# Patient Record
Sex: Male | Born: 1935 | Race: White | Hispanic: No | Marital: Married | Smoking: Former smoker
Health system: Southern US, Community
[De-identification: ages and names within clinical notes are randomized; demographics above are authoritative.]

## PROBLEM LIST (undated history)

## (undated) DIAGNOSIS — K219 Gastro-esophageal reflux disease without esophagitis: Secondary | ICD-10-CM

## (undated) DIAGNOSIS — C4490 Unspecified malignant neoplasm of skin, unspecified: Secondary | ICD-10-CM

## (undated) DIAGNOSIS — I2510 Atherosclerotic heart disease of native coronary artery without angina pectoris: Secondary | ICD-10-CM

## (undated) DIAGNOSIS — I739 Peripheral vascular disease, unspecified: Secondary | ICD-10-CM

## (undated) DIAGNOSIS — I519 Heart disease, unspecified: Secondary | ICD-10-CM

## (undated) DIAGNOSIS — F329 Major depressive disorder, single episode, unspecified: Secondary | ICD-10-CM

## (undated) DIAGNOSIS — E785 Hyperlipidemia, unspecified: Secondary | ICD-10-CM

## (undated) DIAGNOSIS — I10 Essential (primary) hypertension: Secondary | ICD-10-CM

## (undated) DIAGNOSIS — M1990 Unspecified osteoarthritis, unspecified site: Secondary | ICD-10-CM

## (undated) DIAGNOSIS — N159 Renal tubulo-interstitial disease, unspecified: Secondary | ICD-10-CM

## (undated) DIAGNOSIS — J449 Chronic obstructive pulmonary disease, unspecified: Secondary | ICD-10-CM

## (undated) DIAGNOSIS — R0600 Dyspnea, unspecified: Secondary | ICD-10-CM

## (undated) DIAGNOSIS — F32A Depression, unspecified: Secondary | ICD-10-CM

## (undated) DIAGNOSIS — I219 Acute myocardial infarction, unspecified: Secondary | ICD-10-CM

## (undated) HISTORY — DX: Peripheral vascular disease, unspecified: I73.9

## (undated) HISTORY — PX: COLONOSCOPY: SHX174

## (undated) HISTORY — PX: HERNIA REPAIR: SHX51

## (undated) HISTORY — DX: Hyperlipidemia, unspecified: E78.5

## (undated) HISTORY — DX: Heart disease, unspecified: I51.9

## (undated) HISTORY — PX: EYE SURGERY: SHX253

## (undated) HISTORY — PX: CARDIAC SURGERY: SHX584

## (undated) HISTORY — DX: Essential (primary) hypertension: I10

---

## 2011-08-27 DIAGNOSIS — I700 Atherosclerosis of aorta: Secondary | ICD-10-CM | POA: Diagnosis not present

## 2011-08-27 DIAGNOSIS — E782 Mixed hyperlipidemia: Secondary | ICD-10-CM | POA: Diagnosis not present

## 2011-08-27 DIAGNOSIS — I2510 Atherosclerotic heart disease of native coronary artery without angina pectoris: Secondary | ICD-10-CM | POA: Diagnosis not present

## 2012-03-30 DIAGNOSIS — I714 Abdominal aortic aneurysm, without rupture: Secondary | ICD-10-CM | POA: Diagnosis not present

## 2012-03-30 DIAGNOSIS — I4510 Unspecified right bundle-branch block: Secondary | ICD-10-CM | POA: Diagnosis not present

## 2012-03-30 DIAGNOSIS — I2510 Atherosclerotic heart disease of native coronary artery without angina pectoris: Secondary | ICD-10-CM | POA: Diagnosis not present

## 2012-03-30 DIAGNOSIS — I10 Essential (primary) hypertension: Secondary | ICD-10-CM | POA: Diagnosis not present

## 2012-04-08 DIAGNOSIS — E782 Mixed hyperlipidemia: Secondary | ICD-10-CM | POA: Diagnosis not present

## 2012-04-08 DIAGNOSIS — I2510 Atherosclerotic heart disease of native coronary artery without angina pectoris: Secondary | ICD-10-CM | POA: Diagnosis not present

## 2012-04-08 DIAGNOSIS — I10 Essential (primary) hypertension: Secondary | ICD-10-CM | POA: Diagnosis not present

## 2012-04-09 DIAGNOSIS — R5383 Other fatigue: Secondary | ICD-10-CM | POA: Diagnosis not present

## 2012-04-09 DIAGNOSIS — R5381 Other malaise: Secondary | ICD-10-CM | POA: Diagnosis not present

## 2012-04-09 DIAGNOSIS — I2510 Atherosclerotic heart disease of native coronary artery without angina pectoris: Secondary | ICD-10-CM | POA: Diagnosis not present

## 2012-04-09 DIAGNOSIS — E782 Mixed hyperlipidemia: Secondary | ICD-10-CM | POA: Diagnosis not present

## 2012-06-17 ENCOUNTER — Emergency Department (HOSPITAL_COMMUNITY): Payer: Medicare Other

## 2012-06-17 ENCOUNTER — Encounter (HOSPITAL_COMMUNITY)
Admission: EM | Disposition: A | Discharge status: Rehabilitation Facility | Payer: Self-pay | Source: Ambulatory Visit | Attending: Vascular Surgery

## 2012-06-17 ENCOUNTER — Emergency Department (HOSPITAL_COMMUNITY): Payer: Medicare Other | Admitting: Anesthesiology

## 2012-06-17 ENCOUNTER — Encounter (HOSPITAL_COMMUNITY): Payer: Self-pay | Admitting: Anesthesiology

## 2012-06-17 ENCOUNTER — Encounter (HOSPITAL_COMMUNITY): Payer: Self-pay | Admitting: *Deleted

## 2012-06-17 ENCOUNTER — Inpatient Hospital Stay (HOSPITAL_COMMUNITY)
Admission: EM | Admit: 2012-06-17 | Discharge: 2012-06-22 | DRG: 239 | Disposition: A | Discharge status: Rehabilitation Facility | Payer: Medicare Other | Source: Ambulatory Visit | Attending: Vascular Surgery | Admitting: Vascular Surgery

## 2012-06-17 DIAGNOSIS — I2510 Atherosclerotic heart disease of native coronary artery without angina pectoris: Secondary | ICD-10-CM | POA: Diagnosis present

## 2012-06-17 DIAGNOSIS — I70209 Unspecified atherosclerosis of native arteries of extremities, unspecified extremity: Secondary | ICD-10-CM | POA: Diagnosis not present

## 2012-06-17 DIAGNOSIS — Z79899 Other long term (current) drug therapy: Secondary | ICD-10-CM | POA: Diagnosis not present

## 2012-06-17 DIAGNOSIS — J984 Other disorders of lung: Secondary | ICD-10-CM | POA: Diagnosis not present

## 2012-06-17 DIAGNOSIS — I70229 Atherosclerosis of native arteries of extremities with rest pain, unspecified extremity: Secondary | ICD-10-CM | POA: Diagnosis present

## 2012-06-17 DIAGNOSIS — I724 Aneurysm of artery of lower extremity: Secondary | ICD-10-CM | POA: Diagnosis not present

## 2012-06-17 DIAGNOSIS — Z5189 Encounter for other specified aftercare: Secondary | ICD-10-CM | POA: Diagnosis not present

## 2012-06-17 DIAGNOSIS — F17200 Nicotine dependence, unspecified, uncomplicated: Secondary | ICD-10-CM | POA: Diagnosis present

## 2012-06-17 DIAGNOSIS — I70269 Atherosclerosis of native arteries of extremities with gangrene, unspecified extremity: Secondary | ICD-10-CM | POA: Diagnosis not present

## 2012-06-17 DIAGNOSIS — G547 Phantom limb syndrome without pain: Secondary | ICD-10-CM | POA: Diagnosis not present

## 2012-06-17 DIAGNOSIS — E785 Hyperlipidemia, unspecified: Secondary | ICD-10-CM | POA: Diagnosis present

## 2012-06-17 DIAGNOSIS — S78119A Complete traumatic amputation at level between unspecified hip and knee, initial encounter: Secondary | ICD-10-CM | POA: Diagnosis not present

## 2012-06-17 DIAGNOSIS — I743 Embolism and thrombosis of arteries of the lower extremities: Principal | ICD-10-CM | POA: Diagnosis present

## 2012-06-17 DIAGNOSIS — I999 Unspecified disorder of circulatory system: Secondary | ICD-10-CM | POA: Diagnosis not present

## 2012-06-17 DIAGNOSIS — E739 Lactose intolerance, unspecified: Secondary | ICD-10-CM | POA: Diagnosis present

## 2012-06-17 DIAGNOSIS — Z9861 Coronary angioplasty status: Secondary | ICD-10-CM

## 2012-06-17 DIAGNOSIS — I739 Peripheral vascular disease, unspecified: Secondary | ICD-10-CM | POA: Diagnosis not present

## 2012-06-17 DIAGNOSIS — R0602 Shortness of breath: Secondary | ICD-10-CM | POA: Diagnosis not present

## 2012-06-17 DIAGNOSIS — M79609 Pain in unspecified limb: Secondary | ICD-10-CM | POA: Diagnosis not present

## 2012-06-17 DIAGNOSIS — J189 Pneumonia, unspecified organism: Secondary | ICD-10-CM | POA: Diagnosis not present

## 2012-06-17 DIAGNOSIS — I998 Other disorder of circulatory system: Secondary | ICD-10-CM

## 2012-06-17 HISTORY — PX: FASCIOTOMY: SHX132

## 2012-06-17 HISTORY — PX: EMBOLECTOMY: SHX44

## 2012-06-17 HISTORY — PX: FEMORAL-POPLITEAL BYPASS GRAFT: SHX937

## 2012-06-17 HISTORY — PX: INTRAOPERATIVE ARTERIOGRAM: SHX5157

## 2012-06-17 LAB — BASIC METABOLIC PANEL
BUN: 13 mg/dL (ref 6–23)
BUN: 11 mg/dL (ref 6–23)
Calcium: 9.4 mg/dL (ref 8.4–10.5)
Calcium: 8.1 mg/dL — ABNORMAL LOW (ref 8.4–10.5)
Creatinine, Ser: 0.79 mg/dL (ref 0.50–1.35)
GFR calc Af Amer: >90 mL/min (ref >90)
GFR calc Af Amer: >90 mL/min (ref >90)
GFR calc non Af Amer: 83 mL/min — ABNORMAL LOW (ref >90)
Glucose, Bld: 111 mg/dL — ABNORMAL HIGH (ref 70–99)
Potassium: 4.1 mEq/L (ref 3.5–5.1)
Sodium: 135 mEq/L (ref 135–145)

## 2012-06-17 LAB — CBC
HCT: 37.7 % — ABNORMAL LOW (ref 39.0–52.0)
Hemoglobin: 13 g/dL (ref 13.0–17.0)
MCH: 32.6 pg (ref 26.0–34.0)
MCHC: 34.2 g/dL (ref 30.0–36.0)
MCHC: 34.5 g/dL (ref 30.0–36.0)
Platelets: 84 10*3/uL — ABNORMAL LOW (ref 150–400)
Platelets: 96 10*3/uL — ABNORMAL LOW (ref 150–400)
RBC: 3.99 MIL/uL — ABNORMAL LOW (ref 4.22–5.81)
RDW: 13.8 % (ref 11.5–15.5)
WBC: 7.8 10*3/uL (ref 4.0–10.5)

## 2012-06-17 LAB — TROPONIN I
Troponin I: <0.3 ng/mL (ref <0.30)
Troponin I: <0.3 ng/mL (ref <0.30)

## 2012-06-17 LAB — CK TOTAL AND CKMB (NOT AT ARMC)
CK, MB: 253.8 ng/mL (ref 0.3–4.0)
Relative Index: 1 (ref 0.0–2.5)
Total CK: 24777 U/L — ABNORMAL HIGH (ref 7–232)

## 2012-06-17 LAB — CBC WITH DIFFERENTIAL/PLATELET
Basophils Relative: 0 % (ref 0–1)
Eosinophils Absolute: 0 10*3/uL (ref 0.0–0.7)
Eosinophils Relative: 0 % (ref 0–5)
Lymphs Abs: 1.5 10*3/uL (ref 0.7–4.0)
MCH: 33.2 pg (ref 26.0–34.0)
MCHC: 36 g/dL (ref 30.0–36.0)
MCV: 92.2 fL (ref 78.0–100.0)
Monocytes Relative: 9 % (ref 3–12)
Neutrophils Relative %: 79 % — ABNORMAL HIGH (ref 43–77)
Platelets: 124 10*3/uL — ABNORMAL LOW (ref 150–400)
RBC: 4.88 MIL/uL (ref 4.22–5.81)

## 2012-06-17 SURGERY — EMBOLECTOMY
Anesthesia: General | Site: Leg Lower | Laterality: Right | Wound class: Clean

## 2012-06-17 MED ORDER — CEFAZOLIN SODIUM 1-5 GM-% IV SOLN
2.0000 g | Freq: Once | INTRAVENOUS | Status: AC
  Administered 2012-06-17: 2 g via INTRAVENOUS

## 2012-06-17 MED ORDER — DOPAMINE-DEXTROSE 3.2-5 MG/ML-% IV SOLN
3.0000 ug/kg/min | INTRAVENOUS | Status: DC | PRN
  Filled 2012-06-17: qty 250

## 2012-06-17 MED ORDER — ONDANSETRON HCL 4 MG/2ML IJ SOLN: 4.0000 mg | Freq: Four times a day (QID) | INTRAMUSCULAR | Status: DC | PRN

## 2012-06-17 MED ORDER — OXYCODONE HCL 5 MG/5ML PO SOLN: 5.0000 mg | Freq: Once | ORAL | Status: DC | PRN

## 2012-06-17 MED ORDER — CEFAZOLIN SODIUM 1-5 GM-% IV SOLN
1.0000 g | Freq: Three times a day (TID) | INTRAVENOUS | Status: DC
  Administered 2012-06-17 – 2012-06-18 (×2): 1 g via INTRAVENOUS
  Filled 2012-06-17 (×5): qty 50

## 2012-06-17 MED ORDER — POLYETHYLENE GLYCOL 3350 17 G PO PACK
17.0000 g | PACK | Freq: Every day | ORAL | Status: DC | PRN
  Filled 2012-06-17: qty 1

## 2012-06-17 MED ORDER — LACTATED RINGERS IV SOLN
INTRAVENOUS | Status: DC | PRN
  Administered 2012-06-17 (×4): via INTRAVENOUS

## 2012-06-17 MED ORDER — LISINOPRIL 10 MG PO TABS
10.0000 mg | ORAL_TABLET | Freq: Every day | ORAL | Status: DC
  Administered 2012-06-19 – 2012-06-21 (×3): 10 mg via ORAL
  Filled 2012-06-17 (×5): qty 1

## 2012-06-17 MED ORDER — SODIUM CHLORIDE 0.9 % IV SOLN
Freq: Once | INTRAVENOUS | Status: AC
  Administered 2012-06-17: 08:00:00 via INTRAVENOUS

## 2012-06-17 MED ORDER — SIMVASTATIN 20 MG PO TABS
20.0000 mg | ORAL_TABLET | Freq: Every day | ORAL | Status: DC
  Administered 2012-06-17 – 2012-06-20 (×3): 20 mg via ORAL
  Filled 2012-06-17 (×5): qty 1

## 2012-06-17 MED ORDER — IOHEXOL 300 MG/ML  SOLN
INTRAMUSCULAR | Status: DC | PRN
  Administered 2012-06-17: 50 mL via INTRA_ARTERIAL
  Administered 2012-06-17: 25 mL via INTRA_ARTERIAL

## 2012-06-17 MED ORDER — FENTANYL CITRATE 0.05 MG/ML IJ SOLN
INTRAMUSCULAR | Status: AC
  Filled 2012-06-17: qty 2

## 2012-06-17 MED ORDER — FENTANYL CITRATE 0.05 MG/ML IJ SOLN
INTRAMUSCULAR | Status: DC | PRN
  Administered 2012-06-17: 100 ug via INTRAVENOUS
  Administered 2012-06-17 (×3): 50 ug via INTRAVENOUS
  Administered 2012-06-17: 100 ug via INTRAVENOUS
  Administered 2012-06-17 (×3): 50 ug via INTRAVENOUS

## 2012-06-17 MED ORDER — 0.9 % SODIUM CHLORIDE (POUR BTL) OPTIME
TOPICAL | Status: DC | PRN
  Administered 2012-06-17: 3000 mL

## 2012-06-17 MED ORDER — IOHEXOL 300 MG/ML  SOLN
100.0000 mL | Freq: Once | INTRAMUSCULAR | Status: AC | PRN
  Administered 2012-06-17: 100 mL via INTRAVENOUS

## 2012-06-17 MED ORDER — PROPOFOL 10 MG/ML IV BOLUS
INTRAVENOUS | Status: DC | PRN
  Administered 2012-06-17: 150 mg via INTRAVENOUS

## 2012-06-17 MED ORDER — ACETAMINOPHEN 650 MG RE SUPP: 325.0000 mg | RECTAL | Status: DC | PRN

## 2012-06-17 MED ORDER — ROCURONIUM BROMIDE 100 MG/10ML IV SOLN
INTRAVENOUS | Status: DC | PRN
  Administered 2012-06-17: 50 mg via INTRAVENOUS

## 2012-06-17 MED ORDER — DIPHENHYDRAMINE HCL 12.5 MG/5ML PO ELIX
12.5000 mg | ORAL_SOLUTION | Freq: Four times a day (QID) | ORAL | Status: DC | PRN
  Filled 2012-06-17: qty 5

## 2012-06-17 MED ORDER — OXYCODONE HCL 5 MG PO TABS
5.0000 mg | ORAL_TABLET | Freq: Once | ORAL | Status: DC | PRN
  Administered 2012-06-17: 5 mg via ORAL

## 2012-06-17 MED ORDER — PROMETHAZINE HCL 25 MG/ML IJ SOLN: 6.2500 mg | INTRAMUSCULAR | Status: DC | PRN

## 2012-06-17 MED ORDER — PROTAMINE SULFATE 10 MG/ML IV SOLN
INTRAVENOUS | Status: DC | PRN
  Administered 2012-06-17: 30 mg via INTRAVENOUS

## 2012-06-17 MED ORDER — THROMBIN 20000 UNITS EX SOLR
CUTANEOUS | Status: DC | PRN
  Administered 2012-06-17: 13:00:00 via TOPICAL

## 2012-06-17 MED ORDER — HEPARIN SODIUM (PORCINE) 1000 UNIT/ML IJ SOLN
INTRAMUSCULAR | Status: DC | PRN
  Administered 2012-06-17: 8000 [IU] via INTRAVENOUS
  Administered 2012-06-17: 2000 [IU] via INTRAVENOUS

## 2012-06-17 MED ORDER — HEPARIN SODIUM (PORCINE) 5000 UNIT/ML IJ SOLN
INTRAMUSCULAR | Status: DC | PRN
  Administered 2012-06-17: 11:00:00

## 2012-06-17 MED ORDER — POTASSIUM CHLORIDE CRYS ER 20 MEQ PO TBCR
20.0000 meq | EXTENDED_RELEASE_TABLET | Freq: Every day | ORAL | Status: DC | PRN
  Filled 2012-06-17 (×2): qty 1

## 2012-06-17 MED ORDER — DIPHENHYDRAMINE HCL 50 MG/ML IJ SOLN: 12.5000 mg | Freq: Four times a day (QID) | INTRAMUSCULAR | Status: DC | PRN

## 2012-06-17 MED ORDER — ASPIRIN 81 MG PO CHEW
81.0000 mg | CHEWABLE_TABLET | Freq: Every day | ORAL | Status: DC
  Administered 2012-06-19 – 2012-06-21 (×3): 81 mg via ORAL
  Filled 2012-06-17 (×3): qty 1

## 2012-06-17 MED ORDER — LACTATED RINGERS IV SOLN
INTRAVENOUS | Status: DC | PRN
  Administered 2012-06-17: 10:00:00 via INTRAVENOUS

## 2012-06-17 MED ORDER — BISACODYL 10 MG RE SUPP: 10.0000 mg | Freq: Every day | RECTAL | Status: DC | PRN

## 2012-06-17 MED ORDER — ENOXAPARIN SODIUM 40 MG/0.4ML ~~LOC~~ SOLN
40.0000 mg | SUBCUTANEOUS | Status: DC
  Administered 2012-06-17: 40 mg via SUBCUTANEOUS
  Filled 2012-06-17 (×2): qty 0.4

## 2012-06-17 MED ORDER — SODIUM BICARBONATE 8.4 % IV SOLN
INTRAVENOUS | Status: DC | PRN
  Administered 2012-06-17: 50 meq via INTRAVENOUS

## 2012-06-17 MED ORDER — FENTANYL CITRATE 0.05 MG/ML IJ SOLN
100.0000 ug | Freq: Once | INTRAMUSCULAR | Status: AC
  Administered 2012-06-17: 100 ug via INTRAVENOUS
  Filled 2012-06-17 (×2): qty 2

## 2012-06-17 MED ORDER — PANTOPRAZOLE SODIUM 40 MG PO TBEC
40.0000 mg | DELAYED_RELEASE_TABLET | Freq: Every day | ORAL | Status: DC
  Administered 2012-06-17 – 2012-06-21 (×5): 40 mg via ORAL
  Filled 2012-06-17 (×5): qty 1

## 2012-06-17 MED ORDER — SODIUM CHLORIDE 0.9 % IJ SOLN: 9.0000 mL | INTRAMUSCULAR | Status: DC | PRN

## 2012-06-17 MED ORDER — MORPHINE SULFATE (PF) 1 MG/ML IV SOLN
INTRAVENOUS | Status: DC
  Administered 2012-06-17 – 2012-06-18 (×2): via INTRAVENOUS
  Administered 2012-06-18: 17 mg via INTRAVENOUS
  Administered 2012-06-18: 1 mg via INTRAVENOUS
  Administered 2012-06-18: 5 mg via INTRAVENOUS
  Administered 2012-06-18: 9 mg via INTRAVENOUS
  Administered 2012-06-18: 4 mg via INTRAVENOUS
  Administered 2012-06-18: 2 mg via INTRAVENOUS
  Administered 2012-06-19: 4 mg via INTRAVENOUS
  Administered 2012-06-19: 4.99 mg via INTRAVENOUS
  Administered 2012-06-19: 21:00:00 via INTRAVENOUS
  Filled 2012-06-17 (×2): qty 25

## 2012-06-17 MED ORDER — METOPROLOL TARTRATE 1 MG/ML IV SOLN: 2.0000 mg | INTRAVENOUS | Status: DC | PRN

## 2012-06-17 MED ORDER — ALBUMIN HUMAN 5 % IV SOLN
INTRAVENOUS | Status: DC | PRN
  Administered 2012-06-17: 14:00:00 via INTRAVENOUS

## 2012-06-17 MED ORDER — HYDROMORPHONE HCL PF 1 MG/ML IJ SOLN
0.2500 mg | INTRAMUSCULAR | Status: DC | PRN
  Administered 2012-06-17 (×2): 0.5 mg via INTRAVENOUS

## 2012-06-17 MED ORDER — ACETAMINOPHEN 325 MG PO TABS: 325.0000 mg | ORAL_TABLET | ORAL | Status: DC | PRN

## 2012-06-17 MED ORDER — GLYCOPYRROLATE 0.2 MG/ML IJ SOLN
INTRAMUSCULAR | Status: DC | PRN
  Administered 2012-06-17: .4 mg via INTRAVENOUS

## 2012-06-17 MED ORDER — DOCUSATE SODIUM 100 MG PO CAPS: 100.0000 mg | ORAL_CAPSULE | Freq: Every day | ORAL | Status: DC

## 2012-06-17 MED ORDER — SODIUM CHLORIDE 0.9 % IV SOLN: INTRAVENOUS | Status: DC

## 2012-06-17 MED ORDER — HYDRALAZINE HCL 20 MG/ML IJ SOLN
10.0000 mg | INTRAMUSCULAR | Status: DC | PRN
  Filled 2012-06-17: qty 0.5

## 2012-06-17 MED ORDER — PHENOL 1.4 % MT LIQD: 1.0000 | OROMUCOSAL | Status: DC | PRN

## 2012-06-17 MED ORDER — FENTANYL CITRATE 0.05 MG/ML IJ SOLN
100.0000 ug | Freq: Once | INTRAMUSCULAR | Status: AC
  Administered 2012-06-17: 100 ug via INTRAVENOUS

## 2012-06-17 MED ORDER — OMEGA-3-ACID ETHYL ESTERS 1 G PO CAPS
1.0000 g | ORAL_CAPSULE | Freq: Two times a day (BID) | ORAL | Status: DC
  Administered 2012-06-18 – 2012-06-21 (×7): 1 g via ORAL
  Filled 2012-06-17 (×9): qty 1

## 2012-06-17 MED ORDER — NEOSTIGMINE METHYLSULFATE 1 MG/ML IJ SOLN
INTRAMUSCULAR | Status: DC | PRN
  Administered 2012-06-17: 3 mg via INTRAVENOUS

## 2012-06-17 MED ORDER — ATENOLOL 50 MG PO TABS
50.0000 mg | ORAL_TABLET | Freq: Every day | ORAL | Status: DC
  Administered 2012-06-18 – 2012-06-21 (×4): 50 mg via ORAL
  Filled 2012-06-17 (×5): qty 1

## 2012-06-17 MED ORDER — PHENYLEPHRINE HCL 10 MG/ML IJ SOLN
10.0000 mg | INTRAVENOUS | Status: DC | PRN
  Administered 2012-06-17: 40 ug/min via INTRAVENOUS

## 2012-06-17 MED ORDER — NALOXONE HCL 0.4 MG/ML IJ SOLN: 0.4000 mg | INTRAMUSCULAR | Status: DC | PRN

## 2012-06-17 MED ORDER — DEXTROSE-NACL 5-0.45 % IV SOLN
Freq: Once | INTRAVENOUS | Status: AC
  Administered 2012-06-17: 18:00:00 via INTRAVENOUS

## 2012-06-17 MED ORDER — SODIUM CHLORIDE 0.9 % IV SOLN: 500.0000 mL | Freq: Once | INTRAVENOUS | Status: AC | PRN

## 2012-06-17 MED ORDER — LIDOCAINE HCL 4 % MT SOLN
OROMUCOSAL | Status: DC | PRN
  Administered 2012-06-17: 4 mL via TOPICAL

## 2012-06-17 MED ORDER — EPHEDRINE SULFATE 50 MG/ML IJ SOLN
INTRAMUSCULAR | Status: DC | PRN
  Administered 2012-06-17 (×2): 10 mg via INTRAVENOUS

## 2012-06-17 MED ORDER — FENTANYL CITRATE 0.05 MG/ML IJ SOLN
100.0000 ug | Freq: Once | INTRAMUSCULAR | Status: AC
  Administered 2012-06-17: 100 ug via INTRAVENOUS
  Filled 2012-06-17: qty 2

## 2012-06-17 MED ORDER — SODIUM CHLORIDE 0.9 % IV SOLN
INTRAVENOUS | Status: DC
  Administered 2012-06-17: 05:00:00 via INTRAVENOUS

## 2012-06-17 MED ORDER — SODIUM BICARBONATE 8.4 % IV SOLN
INTRAVENOUS | Status: DC
  Administered 2012-06-17: 17:00:00 via INTRAVENOUS
  Filled 2012-06-17 (×3): qty 150

## 2012-06-17 MED ORDER — LABETALOL HCL 5 MG/ML IV SOLN
10.0000 mg | INTRAVENOUS | Status: DC | PRN
  Filled 2012-06-17: qty 4

## 2012-06-17 MED ORDER — LACTATED RINGERS IV SOLN
INTRAVENOUS | Status: DC
  Administered 2012-06-17 (×2): via INTRAVENOUS

## 2012-06-17 MED ORDER — GUAIFENESIN-DM 100-10 MG/5ML PO SYRP: 15.0000 mL | ORAL_SOLUTION | ORAL | Status: DC | PRN

## 2012-06-17 SURGICAL SUPPLY — 79 items
BANDAGE ELASTIC 6 VELCRO ST LF (GAUZE/BANDAGES/DRESSINGS) ×2 IMPLANT
BANDAGE ESMARK 6X9 LF (GAUZE/BANDAGES/DRESSINGS) ×1 IMPLANT
BANDAGE GAUZE ELAST BULKY 4 IN (GAUZE/BANDAGES/DRESSINGS) ×2 IMPLANT
BNDG ESMARK 6X9 LF (GAUZE/BANDAGES/DRESSINGS) ×2
CANISTER SUCTION 2500CC (MISCELLANEOUS) ×2 IMPLANT
CATH EMB 3FR 40CM (CATHETERS) ×2 IMPLANT
CATH EMB 3FR 80CM (CATHETERS) IMPLANT
CATH EMB 4FR 40CM (CATHETERS) ×2 IMPLANT
CATH EMB 4FR 80CM (CATHETERS) IMPLANT
CATH EMB 5FR 80CM (CATHETERS) IMPLANT
CLIP TI MEDIUM 24 (CLIP) ×2 IMPLANT
CLIP TI WIDE RED SMALL 24 (CLIP) ×2 IMPLANT
CLOTH BEACON ORANGE TIMEOUT ST (SAFETY) ×2 IMPLANT
COVER SURGICAL LIGHT HANDLE (MISCELLANEOUS) ×2 IMPLANT
CUFF TOURNIQUET SINGLE 18IN (TOURNIQUET CUFF) IMPLANT
CUFF TOURNIQUET SINGLE 24IN (TOURNIQUET CUFF) IMPLANT
CUFF TOURNIQUET SINGLE 34IN LL (TOURNIQUET CUFF) ×2 IMPLANT
CUFF TOURNIQUET SINGLE 44IN (TOURNIQUET CUFF) IMPLANT
DERMABOND ADVANCED (GAUZE/BANDAGES/DRESSINGS) ×1
DERMABOND ADVANCED .7 DNX12 (GAUZE/BANDAGES/DRESSINGS) ×1 IMPLANT
DRAIN CHANNEL 15F RND FF W/TCR (WOUND CARE) IMPLANT
DRAPE C-ARM 42X72 X-RAY (DRAPES) ×2 IMPLANT
DRAPE WARM FLUID 44X44 (DRAPE) ×2 IMPLANT
DRAPE X-RAY CASS 24X20 (DRAPES) IMPLANT
DRSG COVADERM 4X8 (GAUZE/BANDAGES/DRESSINGS) IMPLANT
DRSG PAD ABDOMINAL 8X10 ST (GAUZE/BANDAGES/DRESSINGS) ×8 IMPLANT
ELECT REM PT RETURN 9FT ADLT (ELECTROSURGICAL) ×2
ELECTRODE REM PT RTRN 9FT ADLT (ELECTROSURGICAL) ×1 IMPLANT
EVACUATOR SILICONE 100CC (DRAIN) IMPLANT
GLOVE BIO SURGEON STRL SZ7 (GLOVE) ×6 IMPLANT
GLOVE BIOGEL PI IND STRL 6 (GLOVE) ×1 IMPLANT
GLOVE BIOGEL PI IND STRL 6.5 (GLOVE) ×6 IMPLANT
GLOVE BIOGEL PI IND STRL 7.0 (GLOVE) ×1 IMPLANT
GLOVE BIOGEL PI IND STRL 7.5 (GLOVE) ×2 IMPLANT
GLOVE BIOGEL PI INDICATOR 6 (GLOVE) ×1
GLOVE BIOGEL PI INDICATOR 6.5 (GLOVE) ×6
GLOVE BIOGEL PI INDICATOR 7.0 (GLOVE) ×1
GLOVE BIOGEL PI INDICATOR 7.5 (GLOVE) ×2
GLOVE ECLIPSE 6.5 STRL STRAW (GLOVE) ×2 IMPLANT
GLOVE ECLIPSE 7.0 STRL STRAW (GLOVE) ×2 IMPLANT
GLOVE SS BIOGEL STRL SZ 7 (GLOVE) ×1 IMPLANT
GLOVE SUPERSENSE BIOGEL SZ 7 (GLOVE) ×1
GOWN BRE IMP SLV AUR LG STRL (GOWN DISPOSABLE) ×2 IMPLANT
GOWN PREVENTION PLUS XLARGE (GOWN DISPOSABLE) ×4 IMPLANT
GOWN STRL NON-REIN LRG LVL3 (GOWN DISPOSABLE) ×12 IMPLANT
GRAFT PROPATEN W/RING 6X80X60 (Vascular Products) ×2 IMPLANT
KIT BASIN OR (CUSTOM PROCEDURE TRAY) ×2 IMPLANT
KIT ROOM TURNOVER OR (KITS) ×2 IMPLANT
NEEDLE PERC 18GX7CM (NEEDLE) ×2 IMPLANT
NS IRRIG 1000ML POUR BTL (IV SOLUTION) ×6 IMPLANT
PACK PERIPHERAL VASCULAR (CUSTOM PROCEDURE TRAY) ×2 IMPLANT
PAD ARMBOARD 7.5X6 YLW CONV (MISCELLANEOUS) ×4 IMPLANT
SET COLLECT BLD 21X3/4 12 (NEEDLE) ×2 IMPLANT
SHEATH AVANTI 11CM 5FR (MISCELLANEOUS) ×2 IMPLANT
SPONGE INTESTINAL PEANUT (DISPOSABLE) ×4 IMPLANT
SPONGE SURGIFOAM ABS GEL 100 (HEMOSTASIS) IMPLANT
STAPLER VISISTAT 35W (STAPLE) ×4 IMPLANT
STOPCOCK 4 WAY LG BORE MALE ST (IV SETS) ×2 IMPLANT
STRIP CLOSURE SKIN 1/2X4 (GAUZE/BANDAGES/DRESSINGS) IMPLANT
SUT GORETEX 5 0 TT13 24 (SUTURE) ×2 IMPLANT
SUT GORETEX 6.0 TT9 (SUTURE) ×4 IMPLANT
SUT MNCRL AB 4-0 PS2 18 (SUTURE) ×4 IMPLANT
SUT PROLENE 5 0 C 1 24 (SUTURE) ×2 IMPLANT
SUT PROLENE 6 0 BV (SUTURE) ×2 IMPLANT
SUT SILK 0 TIES 10X30 (SUTURE) ×2 IMPLANT
SUT SILK 2 0 FS (SUTURE) IMPLANT
SUT SILK 2 0 SH (SUTURE) ×2 IMPLANT
SUT VIC AB 2-0 CT1 27 (SUTURE) ×2
SUT VIC AB 2-0 CT1 TAPERPNT 27 (SUTURE) ×2 IMPLANT
SUT VIC AB 3-0 SH 27 (SUTURE) ×2
SUT VIC AB 3-0 SH 27X BRD (SUTURE) ×2 IMPLANT
SYR 3ML LL SCALE MARK (SYRINGE) ×2 IMPLANT
SYR TB 1ML LUER SLIP (SYRINGE) ×2 IMPLANT
TOWEL OR 17X24 6PK STRL BLUE (TOWEL DISPOSABLE) ×6 IMPLANT
TOWEL OR 17X26 10 PK STRL BLUE (TOWEL DISPOSABLE) ×4 IMPLANT
TRAY FOLEY CATH 14FRSI W/METER (CATHETERS) ×2 IMPLANT
TUBING EXTENTION W/L.L. (IV SETS) IMPLANT
UNDERPAD 30X30 INCONTINENT (UNDERPADS AND DIAPERS) ×2 IMPLANT
WATER STERILE IRR 1000ML POUR (IV SOLUTION) ×2 IMPLANT

## 2012-06-17 NOTE — Anesthesia Preprocedure Evaluation (Signed)
Anesthesia Evaluation    History of Anesthesia Complications Negative for: history of anesthetic complications  Airway       Dental   Pulmonary          Cardiovascular + CAD     Neuro/Psych    GI/Hepatic negative GI ROS, Neg liver ROS,   Endo/Other  negative endocrine ROS  Renal/GU negative Renal ROS     Musculoskeletal   Abdominal   Peds  Hematology   Anesthesia Other Findings   Reproductive/Obstetrics                           Anesthesia Physical Anesthesia Plan  ASA: III and Emergent  Anesthesia Plan: General   Post-op Pain Management:    Induction:   Airway Management Planned: Oral ETT  Additional Equipment:   Intra-op Plan:   Post-operative Plan: Post-operative intubation/ventilation  Informed Consent: I have reviewed the patients History and Physical, chart, labs and discussed the procedure including the risks, benefits and alternatives for the proposed anesthesia with the patient or authorized representative who has indicated his/her understanding and acceptance.   Dental advisory given  Plan Discussed with: CRNA, Anesthesiologist and Surgeon  Anesthesia Plan Comments:         Anesthesia Quick Evaluation

## 2012-06-17 NOTE — Transfer of Care (Signed)
Immediate Anesthesia Transfer of Care Note  Patient: Deontra Pereyra Srey  Procedure(s) Performed: Procedure(s) (LRB) with comments: EMBOLECTOMY (Right) - Thrombectomy of right tibial arteries,exclusion of right popliteal aneurysm, femoral -popliteal artery bypass graft,four compartment fasciotomies BYPASS GRAFT FEMORAL-POPLITEAL ARTERY (Right) - Using 6 mm x 80 cm propaten goretex graft INTRA OPERATIVE ARTERIOGRAM (Right) FASCIOTOMY (Right) - four compartment  fasciotomy  Patient Location: PACU  Anesthesia Type: General  Level of Consciousness: awake, sedated and patient cooperative  Airway & Oxygen Therapy: Patient Spontanous Breathing and Patient connected to nasal cannula oxygen  Post-op Assessment: Report given to PACU RN, Post -op Vital signs reviewed and stable, Patient moving all extremities and Patient able to stick tongue midline  Post vital signs: Reviewed and stable  Complications: No apparent anesthesia complications

## 2012-06-17 NOTE — Progress Notes (Signed)
CRITICAL VALUE ALERT  Critical value received:  CKMB 330   Date of notification:  06-17-12  Time of notification:  1700  Critical value read back:yes  Nurse who received alert:  Melanie,RN  MD notified (1st page):  Glennon Hamilton, [place]  Time of first page:  1705  MD notified (2nd page):  Time of second page:1737 to Dr.Chen, 1748 to Dr.Dickson  Responding MD:  Dr.Dickson  Time MD responded:  1754

## 2012-06-17 NOTE — H&P (Addendum)
[facility] [place]  Referred by:  [facility]  Reason for referral: right leg ischemia  History of Present Illness  Stephen Dean is a 78 y.o. (15-Jul-1929) male who presents with chief complaint: right leg pain.  Onset of symptom occurred 10 am yesterday.  Pain is described as aching, severity 10/10, and associated with movement.  Patient has attempted to treat this pain with rest and OTC medications.  The patient has rest pain symptoms and anesthesia and motor loss.  Atherosclerotic risk factors include: hyperlipidemia, smoking.  PMH Hyperlipidemia CAD Glucose intolerance  Past Surgical History  Procedure Date  . Hernia repair   . Cardiac surgery     Stent placement x 2    History   Social History  . Marital Status: Married    Spouse Name: N/A    Number of Children: N/A  . Years of Education: N/A   Occupational History  . Not on file.   Social History Main Topics  . Smoking status: Current Every Day Smoker -- 1.0 packs/day    Types: Cigarettes  . Smokeless tobacco: Not on file  . Alcohol Use: No  . Drug Use: No  . Sexually Active:    Other Topics Concern  . Not on file   Social History Narrative  . No narrative on file    No family history on file.  No current facility-administered medications on file prior to encounter.   Current Outpatient Prescriptions on File Prior to Encounter  Medication Sig Dispense Refill  . atenolol (TENORMIN) 50 MG tablet Take 50 mg by mouth daily.      . fosinopril (MONOPRIL) 10 MG tablet Take 10 mg by mouth daily.      Marland Kitchen omega-3 acid ethyl esters (LOVAZA) 1 G capsule Take 1 g by mouth 2 (two) times daily.      . pravastatin (PRAVACHOL) 40 MG tablet Take 80 mg by mouth daily.        No Known Allergies  REVIEW OF SYSTEMS:  (Positives checked otherwise negative)  CARDIOVASCULAR:  [ ]  chest pain, [ ]  chest pressure, [ ]  palpitations, [ ]  shortness of breath when laying flat, [ ]  shortness of  breath with exertion,   [x]  pain in feet when walking, [x]  pain in feet when laying flat, [ ]  history of blood clot in veins (DVT), [ ]  history of phlebitis, [ ]  swelling in legs, [ ]  varicose veins  PULMONARY:  [ ]  productive cough, [ ]  asthma, [ ]  wheezing  NEUROLOGIC:  [x]  weakness in arms or legs, [x]  numbness in arms or legs, [ ]  difficulty speaking or slurred speech, [ ]  temporary loss of vision in one eye, [ ]  dizziness  HEMATOLOGIC:  [ ]  bleeding problems, [ ]  problems with blood clotting too easily  MUSCULOSKEL:  [ ]  joint pain, [ ]  joint swelling  GASTROINTEST:  [ ]   Vomiting blood, [ ]   Blood in stool     GENITOURINARY:  [ ]   Burning with urination, [ ]   Blood in urine  PSYCHIATRIC:  [ ]  history of major depression  INTEGUMENTARY:  [ ]  rashes, [ ]  ulcers  CONSTITUTIONAL:  [ ]  fever, [ ]  chills   Physical Examination  Filed Vitals:   06/17/12 0732 06/17/12 0803 06/17/12 0815 06/17/12 0816  BP: 152/80 164/102  150/74  Pulse: 94 105 105   Temp:  98.6 F (37 C)    TempSrc:  Oral    Resp: 18  18    SpO2: 95% 97% 94%    There is no height or weight on file to calculate BMI.  General: A&O x 3, WDWN  Head: Adrian/AT  Ear/Nose/Throat: Hearing grossly intact, nares w/o erythema or drainage, oropharynx w/o Erythema/Exudate  Eyes: PERRLA, EOMI  Neck: Supple, no nuchal rigidity, no palpable LAD  Pulmonary: Sym exp, good air movt, CTAB, no rales, rhonchi, & wheezing  Cardiac: RRR, Nl S1, S2, no Murmurs, rubs or gallops  Vascular: Vessel Right Left  Radial Palpable Palpable  Ulnar Palpable Palpable  Brachial Palpable Palpable  Carotid Palpable, without bruit Palpable, without bruit  Aorta Not palpable N/A  Femoral Palpable Palpable  Popliteal Not palpable Not palpable  PT Not Palpable Not Palpable  DP Not Palpable Not Palpable   Gastrointestinal: soft, NTND, -G/R, - HSM, - masses, - CVAT B, no palpable midline pulse  Musculoskeletal: M/S 5/5 throughout except  RLE: 0/5 PF/DF, Extremities without ischemic changes except right foot: cold foot from knee down, cyanotic toes  Neurologic: CN 2-12 intact , Pain and light touch intact in extremities except R foot, Motor exam as listed above  Psychiatric: Judgment intact, Mood & affect appropriatefor pt's clinical situation  Dermatologic: See M/S exam for extremity exam, no rashes otherwise noted  Lymph : No Cervical, Axillary, or Inguinal lymphadenopathy   Laboratory: CBC:    Component Value Date/Time   WBC 12.6* 06/17/2012 0518   RBC 4.88 06/17/2012 0518   HGB 16.2 06/17/2012 0518   HCT 45.0 06/17/2012 0518   PLT 124* 06/17/2012 0518   MCV 92.2 06/17/2012 0518   MCH 33.2 06/17/2012 0518   MCHC 36.0 06/17/2012 0518   RDW 13.6 06/17/2012 0518   LYMPHSABS 1.5 06/17/2012 0518   MONOABS 1.1* 06/17/2012 0518   EOSABS 0.0 06/17/2012 0518   BASOSABS 0.0 06/17/2012 0518    BMP:    Component Value Date/Time   NA 135 06/17/2012 0518   K 4.1 06/17/2012 0518   CL 95* 06/17/2012 0518   CO2 27 06/17/2012 0518   GLUCOSE 111* 06/17/2012 0518   BUN 13 06/17/2012 0518   CREATININE 0.77 06/17/2012 0518   CALCIUM 9.4 06/17/2012 0518   GFRNONAA 83* 06/17/2012 0518   GFRAA >90 06/17/2012 0518    Coagulation: No results found for this basename: INR, PROTIME   No results found for this basename: PTT   CK 19009  CTA Rt leg runoff (06/17/12) 1. 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  2. Collateral reconstitution of the distal right popliteal artery and proximal anterior tibial runoff ; there is incomplete opacification distally to assess contiguity of the runoff across the ankle.  3. Patchy atheromatous plaque left femoral popliteal plaque without aneurysm. There is diseased anterior tibial and peroneal runoff.  Based on my review of this patient's RLE runoff, this patient has a thrombosed large popliteal artery with possibly occluded tibial runoff.  Medical Decision  Making  Stephen Dean is a 78 y.o. male who presents with: threatened right leg.   By history, this patient is well outside of the 6 hour window for limb salvage.  The elevated CK demonstrate some muscle death, but there is no frank rigor in the right lower leg muscles yet.  I don't think it is unreasonable to attempt an Emergency thrombectomy of the tibial arteries, exclusion of the femoropopliteal arterial segment, right femoropopliteal bypass, and right calf fasciotomies.  I have discussed with the patient that he is at very high risk for limb  loss, as the leg is partially dead already.  The patient has explicitly stated he does NOT want an amputation. The risk, benefits, and alternative for bypass operations were discussed with the patient.  The patient is aware the risks include but are not limited to: bleeding, infection, myocardial infarction, stroke, limb loss, nerve damage, need for additional procedures in the future, wound complications, and inability to complete the bypass.  The patient is aware of these risks and agreed to proceed.  I discussed in depth with the patient the nature of atherosclerosis, and emphasized the importance of maximal medical management including strict control of blood pressure, blood glucose, and lipid levels, antiplatelet agents, obtaining regular exercise, and cessation of smoking.  The patient is aware that without maximal medical management the underlying atherosclerotic disease process Stephen progress, limiting the benefit of any interventions.  Thank you for allowing [place] to participate in this patient's care.  Leonides Sake, MD [facility] [place] Office: [phone] Pager: [phone]  06/17/2012, 9:19 AM

## 2012-06-17 NOTE — ED Notes (Signed)
Pt transferred via carelink from [place].  Pt to see vascular surgery Dixon MD.  RN unable to find pedal pulses and Rt foot appears blue and cold.  Pt reports no sensation on bottom of foot but does have delayed cap refill of less than 2 sec.  RN received report that pt has aneurysm on rt leg but was vague as to where.  Pt alert oriented X4 and reports no pain at this time.  Pt has history of hypertension and abdominal aneurysm per carelink and family.

## 2012-06-17 NOTE — ED Provider Notes (Addendum)
History     CSN: [id number]  Arrival date & time 06/17/12  0430   First MD Initiated Contact with Patient 06/17/12 [phone]      Chief Complaint  Patient presents with  . Right leg injury     (Consider location/radiation/quality/duration/timing/severity/associated sxs/prior treatment) HPI This is an 78 year old male who was attempting to push up a window from a squatting position yesterday morning about 10 AM. He felt a pop but no pain immediately; within about half an hour he began to have pain in his right lower leg , worse with ambulation or movement. He has subsequently become unable to bear weight on his right lower leg. He is unable to dorsiflex his right foot or wiggle the toes of his right foot. His right foot is pale and cool. He states his pain is about a 9/10, located in the soft tissue of the right lower leg. There's no deformity or swelling.  History reviewed. No pertinent past medical history.  Past Surgical History  Procedure Date  . Hernia repair   . Cardiac surgery     Stent placement x 2    No family history on file.  History  Substance Use Topics  . Smoking status: Current Every Day Smoker -- 1.0 packs/day    Types: Cigarettes  . Smokeless tobacco: Not on file  . Alcohol Use: No      Review of Systems  All other systems reviewed and are negative.    Allergies  Review of patient's allergies indicates no known allergies.  Home Medications   Current Outpatient Rx  Name Route Sig Dispense Refill  . ACETAMINOPHEN 325 MG PO TABS Oral Take 650 mg by mouth every 6 (six) hours as needed.    . ASPIRIN 81 MG PO CHEW Oral Chew 81 mg by mouth daily.    . ATENOLOL 50 MG PO TABS Oral Take 50 mg by mouth daily.    . FOSINOPRIL SODIUM 10 MG PO TABS Oral Take 10 mg by mouth daily.    . CENTURY SENIOR PO Oral Take 1 tablet by mouth daily.    . OMEGA-3-ACID ETHYL ESTERS 1 G PO CAPS Oral Take 1 g by mouth 2 (two) times daily.    Marland Kitchen PRAVASTATIN SODIUM 40 MG PO TABS  Oral Take 80 mg by mouth daily.    Marland Kitchen VITAMIN C 500 MG PO TABS Oral Take 250 mg by mouth daily.    Marland Kitchen VITAMIN E 400 UNITS PO CAPS Oral Take 400 Units by mouth daily.      BP 150/74  Pulse 105  Temp 98.6 F (37 C) (Oral)  Resp 18  SpO2 94%  Physical Exam General: Well-developed, well-nourished male in no acute distress; appearance consistent with age of record HENT: normocephalic, atraumatic Eyes: pupils equal round and reactive to light; extraocular muscles intact; lens implants Neck: supple Heart: regular rate and rhythm Lungs: clear to auscultation bilaterally Abdomen: soft; nondistended; nontender; bowel sounds present Extremities: No deformity or edema; soft tissue tenderness of right lower leg; right foot pale and cool to touch with poor capillary refill, right posterior tibial and dorsalis pedis pulses not palpable or Dopplerable; flexion and extension of the right ankle and toes are virtually absent due to muscular weakness; flexion and extension of the right hip and right knee are intact; the left lower extremity is neurovascularly intact without pulse or motor deficit Neurologic: Awake, alert and oriented; motor function intact in all extremities except as noted above; no facial droop; decreased  sensation right foot Skin: Warm and dry Psychiatric: Normal mood and affect    ED Course  Procedures (including critical care time)  CRITICAL CARE Performed by: Eiliyah Reh L   Total critical care time: 40 minutes  Critical care time was exclusive of separately billable procedures and treating other patients.  Critical care was necessary to treat or prevent imminent or life-threatening deterioration.  Critical care was time spent personally by me on the following activities: development of treatment plan with patient and/or surrogate as well as nursing, discussions with consultants, evaluation of patient's response to treatment, examination of patient, obtaining history from  patient or surrogate, ordering and performing treatments and interventions, ordering and review of laboratory studies, ordering and review of radiographic studies, pulse oximetry and re-evaluation of patient's condition.    MDM   Nursing notes and vitals signs, including pulse oximetry, reviewed.  Summary of this visit's results, reviewed by myself:  Labs:  Results for orders placed during the hospital encounter of 06/17/12  CBC WITH DIFFERENTIAL      Component Value Range   WBC 12.6 (*) 4.0 - 10.5 K/uL   RBC 4.88  4.22 - 5.81 MIL/uL   Hemoglobin 16.2  13.0 - 17.0 g/dL   HCT 16.1  09.6 - 04.5 %   MCV 92.2  78.0 - 100.0 fL   MCH 33.2  26.0 - 34.0 pg   MCHC 36.0  30.0 - 36.0 g/dL   RDW 40.9  81.1 - 91.4 %   Platelets 124 (*) 150 - 400 K/uL   Neutrophils Relative 79 (*) 43 - 77 %   Neutro Abs 10.0 (*) 1.7 - 7.7 K/uL   Lymphocytes Relative 12  12 - 46 %   Lymphs Abs 1.5  0.7 - 4.0 K/uL   Monocytes Relative 9  3 - 12 %   Monocytes Absolute 1.1 (*) 0.1 - 1.0 K/uL   Eosinophils Relative 0  0 - 5 %   Eosinophils Absolute 0.0  0.0 - 0.7 K/uL   Basophils Relative 0  0 - 1 %   Basophils Absolute 0.0  0.0 - 0.1 K/uL  BASIC METABOLIC PANEL      Component Value Range   Sodium 135  135 - 145 mEq/L   Potassium 4.1  3.5 - 5.1 mEq/L   Chloride 95 (*) 96 - 112 mEq/L   CO2 27  19 - 32 mEq/L   Glucose, Bld 111 (*) 70 - 99 mg/dL   BUN 13  6 - 23 mg/dL   Creatinine, Ser 7.82  0.50 - 1.35 mg/dL   Calcium 9.4  8.4 - 95.6 mg/dL   GFR calc non Af Amer 83 (*) >90 mL/min   GFR calc Af Amer >90  >90 mL/min  CK      Component Value Range   Total CK 19009 (*) 7 - 232 U/L    Imaging Studies: Ct Angio Low Extrem Right W/cm &/or Wo/cm  06/17/2012  *RADIOLOGY REPORT*  Clinical data:  Right lower extremity swelling, loss of pulses.  CT ANGIOGRAM   BILATERAL LOWER EXTREMITIES WITH CONTRAST  Technique:  Axial helical CT of the  bilateral lower extremities after       Optiray 350 IV.  Coronal and  sagittal reconstructions were generated for vascular evaluation.  Comparison:  None  Findings: On the RIGHT, atherosclerotic plaque in the distal SFA.  There is a fusiform proximal popliteal aneurysm measuring up to 4.5 cm diameter, extending over a length of 11.2 cm,  from the adductor hiatus to just above the knee.  There is   a large amount of intraluminal thrombus. No evidence of rupture or extravasation. There is occlusion of the lumen in the distal aspect of the aneurysm which is above the knee joint.  The popliteal artery below the knee joint is atherosclerotic, of normal caliber, mostly thrombosed with a small reconstituted channel.  There is fairly heavy atheromatous plaque in its bifurcation.  The anterior tibial artery is patent proximally.  The tibioperoneal trunk appears occluded; there is reconstitution of the proximal peroneal and posterior tibial arteries. Poor opacification of distal tibial runoff vessels.  On the LEFT, patchy atheromatous calcification in the distal SFA resulting in tandem moderate stenoses.  There is mild eccentric calcified plaque scattered through the popliteal artery without aneurysm.  There is origin occlusion or stenosis of the anterior tibial artery, which is patent distally across the ankle as dorsalis pedis.  There is eccentric calcified plaque in the tibioperoneal trunk which remains patent. Mildly diseased but contiguous peroneal artery with a prominent posterior communicating branch supplying below the ankle.  Proximal posterior tibial artery is patent, but is mildly diseased and diminutive distally with probable tandem stenoses or short segment occlusions.  Venous phase imaging was not obtained.  IMPRESSION  1.  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 2. Collateral reconstitution of the distal right popliteal artery and proximal anterior tibial runoff ; there is incomplete opacification distally to assess contiguity of the runoff across the ankle. 3.   Patchy atheromatous plaque left femoral popliteal plaque without aneurysm. There is diseased anterior tibial and peroneal runoff.   Original Report Authenticated By: Osa Craver, M.D.    Dg Chest Port 1 View  06/17/2012  *RADIOLOGY REPORT*  Clinical Data: Short of breath  PORTABLE CHEST - 1 VIEW  Comparison: None.  Findings: Lungs are hyperaerated.  11 mm nodular density is present at the left base.  Normal heart size.  No pneumothorax.  No pleural effusion.  Normal vascularity.  IMPRESSION: Hyperaeration likely related to COPD.  11 mm nodular density at the left base.  Upright PA and lateral chest radiograph with nipple markers is recommended.   Original Report Authenticated By: Donavan Burnet, M.D.     7:25 AM IV fluids increased due to dark urine suggesting rhabdomyolysis of ischemic musculature of right lower leg. Dr. Edilia Bo (vascular surgery) accepts for transfer to [facility] ED.  9:05 AM Patient transferred to [facility] for surgical consultation and likely intervention. Elevated CK consistent with rhabdomyolysis.     Hanley Seamen, MD 06/17/12 0730  Hanley Seamen, MD 06/17/12 [phone]

## 2012-06-17 NOTE — Progress Notes (Signed)
[facility] [place]  I have discussed with the family the findings.  At this point, they agree that the patient should undergo an R AKA in order to avoid any SIRS or renal failure related to myoglobin released due to extensive dead gastrocnemius in the R leg.  The family and I agree to delay the AKA until tomorrow to give him a night to make a decision in regards to the L AKA.  He is tenatively scheduled for L AKA tomorrow to preserve the OR time.  Leonides Sake, MD [facility] [place] Office: [phone] Pager: [phone]  06/17/2012, 4:09 PM

## 2012-06-17 NOTE — ED Notes (Signed)
Per pt report: Yesterday at 10am pt was pushing the window up while stooped down.  When pt rose up, pt heard something popped.  Pt since then has had pain in his right leg.  Pt's right left is pale and cooler to touch compared to the left left.  DP pulses not palpated on the right foot but pt has a cap refill of less than 3 seconds.  Pt has limited movement in ankles.  Pt pain in right leg is on the calves and pain increases upon palpation.  Slight swelling noted on right ankles but no deformities noted.

## 2012-06-17 NOTE — Progress Notes (Signed)
MEDICATION RELATED CONSULT NOTE - INITIAL   Pharmacy Consult for Sodium Bicarbonate Drip Indication: Prevention of nephropathy in setting of dead limb  No Known Allergies  Patient Measurements: Weight: 180 lb 12.4 oz (82 kg)  Vital Signs: Temp: 98.2 F (36.8 C) (10/24 1645) Temp src: Oral (10/24 1645) BP: 124/58 mmHg (10/24 1700) Pulse Rate: 101  (10/24 1700) Intake/Output from previous day:   Intake/Output from this shift: Total I/O In: 3700 [I.V.:3200; IV Piggyback:500] Out: 1625 [Urine:1400; Blood:225]  Labs:  [facility] 06/17/12 1528 06/17/12 0518  WBC 7.8 12.6*  HGB 12.9* 16.2  HCT 37.7* 45.0  PLT 84* 124*  APTT -- --  CREATININE 0.79 0.77  LABCREA -- --  CREATININE 0.79 0.77  CREAT24HRUR -- --  MG -- --  PHOS -- --  ALBUMIN -- --  PROT -- --  ALBUMIN -- --  AST -- --  ALT -- --  ALKPHOS -- --  BILITOT -- --  BILIDIR -- --  IBILI -- --   CrCl is unknown because there is no height on file for the current visit.   Microbiology: No results found for this or any previous visit (from the past 720 hour(s)).  Medical History: History reviewed. No pertinent past medical history.  Assessment: 78 y.o. M who [facility] to the [facility] with R leg ischemia outside of window for leg salvage. The patient was noted to have a change in urine color consistent with myoglobin release from dead muscle. Pharmacy was consulted to start a bicarbonate drip to help prevent renal injury.   This patient was discussed with Dr. Imogene Burn -- he desires to only continue the sodium bicarbonate drip until tomorrow morning. The patient is noted to have already been exposed to IV contrast prior to pharmacy being consulted to dose sodium bicarb.   Goal of Therapy:  Prevention of further renal injury  Plan:  1. Initiate sodium bicarbonate 150 mEq in D5W at rate of 100 ml/hr for ~12 hours 2. Pharmacy will sign off as no further dose adjustments expected at this time.  Georgina Pillion, PharmD,  BCPS Clinical Pharmacist Pager: [phone] 06/17/2012 5:19 PM

## 2012-06-17 NOTE — ED Provider Notes (Signed)
Pt transferred from [facility] for evaluation of thrombosed R femoral artery aneurysm with cold pulseless foot, to see Vascular Surgery. Per nurse, Dr. Durwin Nora aware patient is here and will be down to see him when he finishes in the OR. Pt given pain medications. Pre-op CXR done as well.   Elina Streng B. Bernette Mayers, MD 06/17/12 0900

## 2012-06-17 NOTE — ED Notes (Signed)
Patient transported to CT 

## 2012-06-17 NOTE — ED Notes (Signed)
MD at bedside. 

## 2012-06-17 NOTE — Anesthesia Postprocedure Evaluation (Signed)
  Anesthesia Post-op Note  Patient: Stephen Dean  Procedure(s) Performed: Procedure(s) (LRB) with comments: EMBOLECTOMY (Right) - Thrombectomy of right tibial arteries,exclusion of right popliteal aneurysm, femoral -popliteal artery bypass graft,four compartment fasciotomies BYPASS GRAFT FEMORAL-POPLITEAL ARTERY (Right) - Using 6 mm x 80 cm propaten goretex graft INTRA OPERATIVE ARTERIOGRAM (Right) FASCIOTOMY (Right) - four compartment  fasciotomy  Patient Location: PACU  Anesthesia Type: General  Level of Consciousness: awake, alert , oriented and patient cooperative  Airway and Oxygen Therapy: Patient Spontanous Breathing and Patient connected to nasal cannula oxygen  Post-op Pain: mild  Post-op Assessment: Post-op Vital signs reviewed, Patient's Cardiovascular Status Stable, Respiratory Function Stable, Patent Airway, No signs of Nausea or vomiting and Pain level controlled  Post-op Vital Signs: stable  Complications: No apparent anesthesia complications

## 2012-06-17 NOTE — Op Note (Addendum)
OPERATIVE NOTE   PROCEDURE: 1.  Right anterior tibial artery thrombectomy 2.  Exclusion of popliteal artery 3.  Right common femoral artery to below-the-knee popliteal artery bypass with 6 mm Propaten 4.  Open cannulation of right common femoral artery  5.  Right leg runoff 6.  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  Dead gastrocnemius 2.  Marginal soleus and anterior compartment 3.  Viable lateral compartment 4.  Palpable pulse in anterior tibial artery at end of case 5.  Runoff via anterior tibial artery > posterior tibial artery   CONTRAST: 75 cc  SPECIMEN(S):  Thrombus in the popliteal artery  INDICATIONS:   Stephen Dean is a 78 y.o. male who presents with ischemic leg since 10 am yesterday.  There was already evidence of dead muscle with CPK >19K and foot drop on the right.  I discussed with the patient and family that his only chance to salvage the right leg was a tibial thrombectomy, exclusion of the popliteal aneurysm, femoropopliteal bypass to restore blood flow, and four compartment fasciotomies.  The risk, benefits, and alternative for bypass operations were discussed with the patient.  The patient is aware the risks include but are not limited to: bleeding, infection, myocardial infarction, stroke, limb loss, nerve damage, need for additional procedures in the future, wound complications, and inability to complete the bypass.  The patient is aware of these risks and agreed to proceed.   I emphasized to the family and patient the high likelihood of limb loss in his case.  DESCRIPTION: After full informed written consent was obtained, the patient was brought back to the operating room and placed supine upon the  operating table.  Prior to induction, the patient was given intravenous antibiotics.  After obtaining adequate anesthesia, the patient was prepped and draped in the standard fashion for a femoral to popliteal bypass operation.  The patient was given 8000 units of Heparin intravenously, which was a therapeutic bolus.  In total, 13086 units of Heparin was administrated to achieve and maintain a therapeutic level of anticoagulation.  A sterile tourniquet was placed on the right thigh as a precaution.  Attention was turned to the right groin.  A longitudinal incision was made over the right common femoral artery.  Using blunt dissection and electrocautery, the artery was dissected out from the inguinal ligament down to the femoral bifurcation.  The superficial femoral artery, profunda femoral artery, and external iliac artery were dissected out and vessel loops applied.  Circumflex branches were also dissected and controlled with silk ties.  This common femoral artery was found on exam to be somewhat calcified.  At this point, attention was turned to the calf.  An longitudinal incision was made one finger-width posterior to the tibia.  Using blunt dissection and electrocautery, a plane was developed through the subcutaneous tissue and fascia down to the popliteal space.  The superficial calf muscle was not particular active when stimulated with electrocautery.   The popliteal vein was dissected out and retracted medially and posteriorly.  The below-the-knee popliteal artery was dissected away from lateral popliteal vein.  This popliteal artery was found on exam to be somewhat calcified.  I dissected distally to find the tibioperoneal trunk and anterior tibial artery takeoffs.  These vessels were calcified.  In this dissection process, the soleus was partially taken down.  This muscle was found to be somewhat marginal, with some limited response to electrostimulation.  At this point, I made a transverse arteriotomy in  the below-the-knee popliteal artery with a 11-blade and extended it with a Potts scissor.  I noted some thrombus in the artery and extracted it in continuity and passed it off the field as a specimen.  I passed the 3 Fogarty distally extracting plaque without any thrombus.  I also passed the 4 Fogarty balloon distally, extracting no thrombus or plaque.  This balloon passed down to the dorsum of the foot without any difficulty.  I clamped the popliteal artery proximally and distally and then tunneled from between the femoral condyles to the right groin with the metal Gore tunneler.  I elected to used 6 mm externally ringed Propaten to avoid any further delay in revascularization given the extent of ischemia that had already occurred.  The graft was sewn to the inner cannula and passed through the tunnel, maintaining the orientation of the graft in the tunnel.  The graft was sharply released and the tunnel was removed.  I then tied off the superficial femoral artery with 3 2-0 ligatures and transected the proximal superficial femoral artery to exclude this end of the femoropopiteal aneurysm.  I then clamped the proximal common femoral artery and profunda femoral artery.  The common femoral artery was also somewhat ectatic (1.5-2.0 cm).  I spatulated the proximal end of the graft and then made an arteriotomy and extended it with a Pott's scissor to match the graft.  The graft was sewn to the common femoral artery with a running stitch of CV-5 in a end to side configuration.  I clamped the graft distal to the anastomosis.  I then pulled the graft taunt in the subsatorial tunnel from the popliteal exposure.  I finished transecting the below-the-knee popliteal artery after clamping the proximal end.  I tied this end with two 2-0 Silk ties, to finish excluding the femoropopliteal aneurysm.  I then spatulated the remaining end of the popliteal artery.  I spatulated the distal end of the graft, adjusting the length in the  process.  Extra external rings were removed.  I sewed the graft to the popliteal artery in an end to end fashion with a CV-6.  Prior to completing this anastomosis, I repeated the thrombectomy with the 4 Fogarty, obtaining no clot.  I also allowed the graft to bleeding antegrade, obtaining good pulsatile bleeding.  I completed the anastomosis in the usual fashion.  I released all clamps.  Immediately there was a popliteal pulse present and palpable anterior tibial artery.  I gave the patient 30 mg of Protamine to reverse anticoagulation and placed thrombin and gelfoam in all wounds.    I turned my attention back to the groin.  I cannulated the common femoral artery with a 18 gauge needle and placed a short J-wire into the external iliac artery.  A 5-Fr sheath was placed over the wire into the common femoral artery.  The sheath was connected to IV extension tubing.  I did hand injections to complete a Right leg runoff in stations.  There was no technical issues with bypass.  Runoff is via an anterior tibial artery and a disease tibioperoneal trunk to a posterior tibial artery.  There was limited flow into the feet noted on the films, consistent with microembolization from a popliteal aneurysm.  I removed the sheath and then clamped  the common femoral artery.  I repaired the needle hole with a 6-0 Prolene.  I then turned my attention back to the right calf.  I extended the incision medial down to the level of the ankle, tying off varicose veins in the process.  I opened up the deep and superficial posterior compartment fascia.  At this point, the entire calf muscle appeared dead, and not responsive to electrostimulation.  Normally, I would proceed with amputation, but this patient had explicitly stated he did not want amputation.  I then turned my attention to the lateral calf.  I made an longitudinal incision between the fibula and tibia, mirroring the medial incision.  Using electrocautery, I opened the fascia  of the anterior and lateral compartments.  The muscle in the anterior compartment appears somewhat marginal but responsive to electrostimulation.  The lateral compartment muscle was viable.  At this point, I turned my attention to hemostasis in all surgical sites.  Bleeding points were controlled with electrocautery, ties, and clips.  By the end of this case, there was no further active bleeding.  The right groin was repaired with a double layer of 2-0 Vicryl in the deep tissue, a double layer of 3-0 Vicryl in the subcutaneous tissue, and the skin was reapproximated with a running subcuticular of 4-0 Monocryl.  The skin was cleaned, dried, and reinforced with Dermabond.  I then packed sterile wet-to-dry Kerlix into the calf fasciotomy incisions.  ABD pads were placed around the Kerlix and then the dressing were wrapped with Kerlix and ACE bandages.  A palpable AT pulse was present at this point.  At this point, the anesthetist noted change in the color of the patient's urine.  This is consistent with likely myoglobin release from the dead muscle.  I had discussed these concerns in regards to renal failure with the patient preoperatively, but he insisted that he did not want an amputation.  With the extensive dead muscle in the right leg, I doubt this leg is salvage even with restoration of perfusion as evident by the palpable anterior tibial artery.  COMPLICATIONS: none  CONDITION: guard   Nilda Simmer, MD 06/17/2012 3:32 PM

## 2012-06-17 NOTE — Preoperative (Signed)
Beta Blockers   Reason not to administer Beta Blockers:Concurrent use of intravenous inotropic medications during the perioperative 

## 2012-06-18 ENCOUNTER — Inpatient Hospital Stay (HOSPITAL_COMMUNITY): Payer: Medicare Other | Admitting: Certified Registered Nurse Anesthetist

## 2012-06-18 ENCOUNTER — Encounter (HOSPITAL_COMMUNITY)
Admission: EM | Disposition: A | Discharge status: Rehabilitation Facility | Payer: Self-pay | Source: Ambulatory Visit | Attending: Vascular Surgery

## 2012-06-18 ENCOUNTER — Encounter (HOSPITAL_COMMUNITY): Payer: Self-pay | Admitting: Certified Registered Nurse Anesthetist

## 2012-06-18 DIAGNOSIS — I70269 Atherosclerosis of native arteries of extremities with gangrene, unspecified extremity: Secondary | ICD-10-CM

## 2012-06-18 DIAGNOSIS — I739 Peripheral vascular disease, unspecified: Secondary | ICD-10-CM | POA: Diagnosis not present

## 2012-06-18 DIAGNOSIS — I999 Unspecified disorder of circulatory system: Secondary | ICD-10-CM | POA: Diagnosis not present

## 2012-06-18 HISTORY — PX: AMPUTATION: SHX166

## 2012-06-18 LAB — COMPREHENSIVE METABOLIC PANEL
ALT: 111 U/L — ABNORMAL HIGH (ref 0–53)
AST: 547 U/L — ABNORMAL HIGH (ref 0–37)
Alkaline Phosphatase: 44 U/L (ref 39–117)
Calcium: 8.2 mg/dL — ABNORMAL LOW (ref 8.4–10.5)
Glucose, Bld: 112 mg/dL — ABNORMAL HIGH (ref 70–99)
Potassium: 3.6 mEq/L (ref 3.5–5.1)
Sodium: 138 mEq/L (ref 135–145)
Total Protein: 5.3 g/dL — ABNORMAL LOW (ref 6.0–8.3)

## 2012-06-18 LAB — CK TOTAL AND CKMB (NOT AT ARMC)
CK, MB: 165.8 ng/mL (ref 0.3–4.0)
Relative Index: 0.6 (ref 0.0–2.5)
Total CK: 25758 U/L — ABNORMAL HIGH (ref 7–232)

## 2012-06-18 LAB — TROPONIN I: Troponin I: <0.3 ng/mL (ref <0.30)

## 2012-06-18 SURGERY — AMPUTATION, ABOVE KNEE
Anesthesia: General | Site: Leg Upper | Laterality: Right | Wound class: Clean

## 2012-06-18 MED ORDER — GUAIFENESIN-DM 100-10 MG/5ML PO SYRP: 15.0000 mL | ORAL_SOLUTION | ORAL | Status: DC | PRN

## 2012-06-18 MED ORDER — DEXTROSE 5 % IV SOLN
1.5000 g | Freq: Two times a day (BID) | INTRAVENOUS | Status: AC
  Administered 2012-06-18 – 2012-06-19 (×2): 1.5 g via INTRAVENOUS
  Filled 2012-06-18 (×2): qty 1.5

## 2012-06-18 MED ORDER — LABETALOL HCL 5 MG/ML IV SOLN: 10.0000 mg | INTRAVENOUS | Status: DC | PRN

## 2012-06-18 MED ORDER — GLYCOPYRROLATE 0.2 MG/ML IJ SOLN
INTRAMUSCULAR | Status: DC | PRN
  Administered 2012-06-18: .6 mg via INTRAVENOUS

## 2012-06-18 MED ORDER — LACTATED RINGERS IV SOLN
INTRAVENOUS | Status: DC | PRN
  Administered 2012-06-18 (×3): via INTRAVENOUS

## 2012-06-18 MED ORDER — ALBUMIN HUMAN 5 % IV SOLN
INTRAVENOUS | Status: DC | PRN
  Administered 2012-06-18 (×2): via INTRAVENOUS

## 2012-06-18 MED ORDER — PANTOPRAZOLE SODIUM 40 MG PO TBEC: 40.0000 mg | DELAYED_RELEASE_TABLET | Freq: Every day | ORAL | Status: DC

## 2012-06-18 MED ORDER — CEFAZOLIN SODIUM 1-5 GM-% IV SOLN
INTRAVENOUS | Status: AC
  Filled 2012-06-18: qty 50

## 2012-06-18 MED ORDER — LIDOCAINE HCL (CARDIAC) 20 MG/ML IV SOLN
INTRAVENOUS | Status: DC | PRN
  Administered 2012-06-18: 30 mg via INTRAVENOUS

## 2012-06-18 MED ORDER — ACETAMINOPHEN 650 MG RE SUPP: 325.0000 mg | RECTAL | Status: DC | PRN

## 2012-06-18 MED ORDER — CEFAZOLIN SODIUM 1-5 GM-% IV SOLN
INTRAVENOUS | Status: DC | PRN
  Administered 2012-06-18: 1 g via INTRAVENOUS

## 2012-06-18 MED ORDER — MIDAZOLAM HCL 5 MG/5ML IJ SOLN
INTRAMUSCULAR | Status: DC | PRN
  Administered 2012-06-18: 2 mg via INTRAVENOUS

## 2012-06-18 MED ORDER — FENTANYL CITRATE 0.05 MG/ML IJ SOLN
INTRAMUSCULAR | Status: DC | PRN
  Administered 2012-06-18: 100 ug via INTRAVENOUS
  Administered 2012-06-18: 50 ug via INTRAVENOUS

## 2012-06-18 MED ORDER — METOPROLOL TARTRATE 1 MG/ML IV SOLN: 2.0000 mg | INTRAVENOUS | Status: DC | PRN

## 2012-06-18 MED ORDER — SODIUM CHLORIDE 0.9 % IV SOLN
10.0000 mg | INTRAVENOUS | Status: DC | PRN
  Administered 2012-06-18: 40 ug/min via INTRAVENOUS

## 2012-06-18 MED ORDER — PHENYLEPHRINE HCL 10 MG/ML IJ SOLN
INTRAMUSCULAR | Status: DC | PRN
  Administered 2012-06-18 (×3): 160 ug via INTRAVENOUS

## 2012-06-18 MED ORDER — PROPOFOL 10 MG/ML IV BOLUS
INTRAVENOUS | Status: DC | PRN
  Administered 2012-06-18: 100 mg via INTRAVENOUS

## 2012-06-18 MED ORDER — OXYCODONE-ACETAMINOPHEN 5-325 MG PO TABS
1.0000 | ORAL_TABLET | ORAL | Status: DC | PRN
  Administered 2012-06-20: 2 via ORAL
  Filled 2012-06-18: qty 2

## 2012-06-18 MED ORDER — POTASSIUM CHLORIDE CRYS ER 20 MEQ PO TBCR: 20.0000 meq | EXTENDED_RELEASE_TABLET | Freq: Once | ORAL | Status: AC | PRN

## 2012-06-18 MED ORDER — ONDANSETRON HCL 4 MG/2ML IJ SOLN: 4.0000 mg | Freq: Four times a day (QID) | INTRAMUSCULAR | Status: DC | PRN

## 2012-06-18 MED ORDER — HYDROMORPHONE HCL PF 1 MG/ML IJ SOLN
INTRAMUSCULAR | Status: AC
  Filled 2012-06-18: qty 1

## 2012-06-18 MED ORDER — ACETAMINOPHEN 10 MG/ML IV SOLN: 1000.0000 mg | Freq: Once | INTRAVENOUS | Status: DC | PRN

## 2012-06-18 MED ORDER — MAGNESIUM SULFATE 40 MG/ML IJ SOLN
2.0000 g | Freq: Once | INTRAMUSCULAR | Status: AC | PRN
  Filled 2012-06-18: qty 50

## 2012-06-18 MED ORDER — ONDANSETRON HCL 4 MG/2ML IJ SOLN: 4.0000 mg | Freq: Once | INTRAMUSCULAR | Status: DC | PRN

## 2012-06-18 MED ORDER — 0.9 % SODIUM CHLORIDE (POUR BTL) OPTIME
TOPICAL | Status: DC | PRN
  Administered 2012-06-18: 1000 mL

## 2012-06-18 MED ORDER — SENNOSIDES-DOCUSATE SODIUM 8.6-50 MG PO TABS
1.0000 | ORAL_TABLET | Freq: Every evening | ORAL | Status: DC | PRN
  Filled 2012-06-18: qty 1

## 2012-06-18 MED ORDER — DOCUSATE SODIUM 100 MG PO CAPS
100.0000 mg | ORAL_CAPSULE | Freq: Every day | ORAL | Status: DC
  Administered 2012-06-20 – 2012-06-21 (×2): 100 mg via ORAL
  Filled 2012-06-18 (×2): qty 1

## 2012-06-18 MED ORDER — MORPHINE SULFATE (PF) 1 MG/ML IV SOLN
INTRAVENOUS | Status: AC
  Filled 2012-06-18: qty 25

## 2012-06-18 MED ORDER — SODIUM CHLORIDE 0.9 % IV SOLN
INTRAVENOUS | Status: DC
  Administered 2012-06-18 – 2012-06-19 (×2): via INTRAVENOUS

## 2012-06-18 MED ORDER — PHENOL 1.4 % MT LIQD: 1.0000 | OROMUCOSAL | Status: DC | PRN

## 2012-06-18 MED ORDER — ROCURONIUM BROMIDE 100 MG/10ML IV SOLN
INTRAVENOUS | Status: DC | PRN
  Administered 2012-06-18: 35 mg via INTRAVENOUS
  Administered 2012-06-18: 5 mg via INTRAVENOUS

## 2012-06-18 MED ORDER — HYDROMORPHONE HCL PF 1 MG/ML IJ SOLN
0.2500 mg | INTRAMUSCULAR | Status: DC | PRN
  Administered 2012-06-18 (×2): 0.5 mg via INTRAVENOUS

## 2012-06-18 MED ORDER — ONDANSETRON HCL 4 MG/2ML IJ SOLN
INTRAMUSCULAR | Status: DC | PRN
  Administered 2012-06-18: 4 mg via INTRAVENOUS

## 2012-06-18 MED ORDER — ACETAMINOPHEN 325 MG PO TABS: 325.0000 mg | ORAL_TABLET | ORAL | Status: DC | PRN

## 2012-06-18 MED ORDER — HYDRALAZINE HCL 20 MG/ML IJ SOLN: 10.0000 mg | INTRAMUSCULAR | Status: DC | PRN

## 2012-06-18 MED ORDER — NEOSTIGMINE METHYLSULFATE 1 MG/ML IJ SOLN
INTRAMUSCULAR | Status: DC | PRN
  Administered 2012-06-18: 3 mg via INTRAVENOUS

## 2012-06-18 MED ORDER — ALUM & MAG HYDROXIDE-SIMETH 200-200-20 MG/5ML PO SUSP: 15.0000 mL | ORAL | Status: DC | PRN

## 2012-06-18 SURGICAL SUPPLY — 54 items
BANDAGE ELASTIC 4 VELCRO ST LF (GAUZE/BANDAGES/DRESSINGS) ×2 IMPLANT
BANDAGE ELASTIC 6 VELCRO ST LF (GAUZE/BANDAGES/DRESSINGS) ×2 IMPLANT
BANDAGE ESMARK 6X9 LF (GAUZE/BANDAGES/DRESSINGS) IMPLANT
BANDAGE GAUZE ELAST BULKY 4 IN (GAUZE/BANDAGES/DRESSINGS) ×4 IMPLANT
BLADE SAW RECIP 87.9 MT (BLADE) ×2 IMPLANT
BNDG COHESIVE 6X5 TAN STRL LF (GAUZE/BANDAGES/DRESSINGS) ×2 IMPLANT
BNDG ESMARK 6X9 LF (GAUZE/BANDAGES/DRESSINGS)
CANISTER SUCTION 2500CC (MISCELLANEOUS) ×2 IMPLANT
CLIP TI MEDIUM 6 (CLIP) ×2 IMPLANT
CLOTH BEACON ORANGE TIMEOUT ST (SAFETY) ×2 IMPLANT
COVER SURGICAL LIGHT HANDLE (MISCELLANEOUS) ×2 IMPLANT
CUFF TOURNIQUET SINGLE 24IN (TOURNIQUET CUFF) IMPLANT
CUFF TOURNIQUET SINGLE 34IN LL (TOURNIQUET CUFF) IMPLANT
CUFF TOURNIQUET SINGLE 44IN (TOURNIQUET CUFF) IMPLANT
DRAPE ORTHO SPLIT 77X108 STRL (DRAPES) ×2
DRAPE PROXIMA HALF (DRAPES) ×2 IMPLANT
DRAPE SURG ORHT 6 SPLT 77X108 (DRAPES) ×2 IMPLANT
DRSG ADAPTIC 3X8 NADH LF (GAUZE/BANDAGES/DRESSINGS) ×2 IMPLANT
DRSG PAD ABDOMINAL 8X10 ST (GAUZE/BANDAGES/DRESSINGS) ×2 IMPLANT
ELECT REM PT RETURN 9FT ADLT (ELECTROSURGICAL) ×2
ELECTRODE REM PT RTRN 9FT ADLT (ELECTROSURGICAL) ×1 IMPLANT
EVACUATOR 1/8 PVC DRAIN (DRAIN) ×2 IMPLANT
GLOVE BIOGEL PI IND STRL 6.5 (GLOVE) ×2 IMPLANT
GLOVE BIOGEL PI IND STRL 7.0 (GLOVE) ×1 IMPLANT
GLOVE BIOGEL PI IND STRL 7.5 (GLOVE) ×1 IMPLANT
GLOVE BIOGEL PI INDICATOR 6.5 (GLOVE) ×2
GLOVE BIOGEL PI INDICATOR 7.0 (GLOVE) ×1
GLOVE BIOGEL PI INDICATOR 7.5 (GLOVE) ×1
GLOVE ECLIPSE 6.5 STRL STRAW (GLOVE) ×2 IMPLANT
GLOVE SS BIOGEL STRL SZ 7 (GLOVE) ×2 IMPLANT
GLOVE SUPERSENSE BIOGEL SZ 7 (GLOVE) ×2
GOWN STRL NON-REIN LRG LVL3 (GOWN DISPOSABLE) ×4 IMPLANT
GOWN STRL REIN 2XL LVL4 (GOWN DISPOSABLE) ×2 IMPLANT
KIT BASIN OR (CUSTOM PROCEDURE TRAY) ×2 IMPLANT
KIT ROOM TURNOVER OR (KITS) ×2 IMPLANT
NS IRRIG 1000ML POUR BTL (IV SOLUTION) ×2 IMPLANT
PACK GENERAL/GYN (CUSTOM PROCEDURE TRAY) ×2 IMPLANT
PAD ARMBOARD 7.5X6 YLW CONV (MISCELLANEOUS) ×4 IMPLANT
PADDING CAST COTTON 6X4 STRL (CAST SUPPLIES) IMPLANT
SPONGE GAUZE 4X4 12PLY (GAUZE/BANDAGES/DRESSINGS) ×2 IMPLANT
STAPLER VISISTAT 35W (STAPLE) ×2 IMPLANT
STOCKINETTE IMPERVIOUS LG (DRAPES) ×2 IMPLANT
SUT SILK 2 0 (SUTURE) ×1
SUT SILK 2 0 SH CR/8 (SUTURE) ×4 IMPLANT
SUT SILK 2-0 18XBRD TIE 12 (SUTURE) ×1 IMPLANT
SUT SILK 3 0 (SUTURE) ×1
SUT SILK 3-0 18XBRD TIE 12 (SUTURE) ×1 IMPLANT
SUT VIC AB 0 CTX 18 (SUTURE) ×2 IMPLANT
SUT VIC AB 2-0 CT1 18 (SUTURE) ×2 IMPLANT
SUT VIC AB 2-0 CTX 36 (SUTURE) ×4 IMPLANT
TOWEL OR 17X24 6PK STRL BLUE (TOWEL DISPOSABLE) ×4 IMPLANT
TOWEL OR 17X26 10 PK STRL BLUE (TOWEL DISPOSABLE) ×2 IMPLANT
UNDERPAD 30X30 INCONTINENT (UNDERPADS AND DIAPERS) ×2 IMPLANT
WATER STERILE IRR 1000ML POUR (IV SOLUTION) ×2 IMPLANT

## 2012-06-18 NOTE — Care Management Note (Signed)
    Page 1 of 1   06/22/2012     3:17:54 PM   CARE MANAGEMENT NOTE 06/22/2012  Patient:  DAMARCO, KEYSOR   Account Number:  [id number]  Date Initiated:  06/18/2012  Documentation initiated by:  [facility]  Subjective/Objective Assessment:   Post op fem-pop - facsiotomyon 10-24.  Back to Surgery 10-25 for amputation.     Action/Plan:   Anticipated DC Date:  06/22/2012   Anticipated DC Plan:  IP REHAB FACILITY      DC Planning Services  CM consult      Choice offered to / List presented to:             Status of service:  Completed, signed off Medicare Important Message given?   (If response is "NO", the following Medicare IM given date fields will be blank) Date Medicare IM given:   Date Additional Medicare IM given:    Discharge Disposition:    Per UR Regulation:  Reviewed for med. necessity/level of care/duration of stay  If discussed at Long Length of Stay Meetings, dates discussed:    Comments:  ContactTradarius, [place] Spouse [phone]  06/22/12 Savonna Birchmeier,RN,BSN [phone] PT MEDICALLY STABLE AND BED AVAILABLE ON CIR TODAY.  06-17-12 3:30pm Avie Arenas, RNBSN [phone] Has spouse - now post op new amputation.  NEED PT/OT consults - may be good CIR consult.

## 2012-06-18 NOTE — Anesthesia Postprocedure Evaluation (Signed)
  Anesthesia Post-op Note  Patient: Stephen Dean  Procedure(s) Performed: Procedure(s) (LRB) with comments: AMPUTATION ABOVE KNEE (Right)  Patient Location: PACU  Anesthesia Type: General  Level of Consciousness: awake, alert  and oriented  Airway and Oxygen Therapy: Patient Spontanous Breathing and Patient connected to nasal cannula oxygen  Post-op Pain: mild  Post-op Assessment: Post-op Vital signs reviewed and Patient's Cardiovascular Status Stable  Post-op Vital Signs: stable  Complications: No apparent anesthesia complications

## 2012-06-18 NOTE — Transfer of Care (Signed)
Immediate Anesthesia Transfer of Care Note  Patient: Stephen Dean  Procedure(s) Performed: Procedure(s) (LRB) with comments: AMPUTATION ABOVE KNEE (Right)  Patient Location: PACU  Anesthesia Type: General  Level of Consciousness: awake, alert , oriented and patient cooperative  Airway & Oxygen Therapy: Patient Spontanous Breathing and Patient connected to face mask oxygen  Post-op Assessment: Report given to PACU RN, Post -op Vital signs reviewed and stable and Patient moving all extremities X 4  Post vital signs: Reviewed and stable  Complications: No apparent anesthesia complications

## 2012-06-18 NOTE — H&P (View-Only) (Signed)
[facility]  Daily Progress Note  Assessment/Planning: POD #1 s/p R AT thrombectomy, R fem-BK pop with Propaten, R leg angiogram, R 4-compartment fasciotomies   Warm viable foot with palpable AT   Unfortunately, patient's entire calf muscle was dead on exploration as evident by CK > 20K.  Fortunately, with the bicarb. drip overnight no kidney failure has started  I discussed with the patient proceeding with R AKA to avoid kidney failure due to myoglobin release from dead muscle tissue.  At this point, he has has agreed to proceed. I discussed in depth the nature of above-the-knee amputation with the patient, including risks, benefits, and alternatives.   The patient is aware that the risks of above-the-knee amputation include but are not limited to: bleeding, infection, myocardial infarction, stroke, death, failure to heal amputation wound, and possible need for more proximal amputation.   The patient is aware of the risks and agrees to proceed forward with the procedure.  Subjective  - 1 Day Post-Op  Pain is minimal  Objective Filed Vitals:   06/18/12 0500 06/18/12 0600 06/18/12 0700 06/18/12 0750  BP: 120/57 116/48 115/50   Pulse: 97 76 85   Temp:    98.6 F (37 C)  TempSrc:    Oral  Resp: 15 19 17   Weight:  180 lb 8.9 oz (81.9 kg)    SpO2: 96% 96% 95%     Intake/Output Summary (Last 24 hours) at 06/18/12 0755 Last data filed at 06/18/12 0700  Gross per 24 hour  Intake 5532.33 ml  Output   3125 ml  Net 2407.33 ml    PULM  CTAB CV  RRR GI  soft, NTND VASC  R foot warm, calf bandaged, palpable AT  Laboratory CBC    Component Value Date/Time   WBC 9.9 06/17/2012 2206   HGB 13.0 06/17/2012 2206   HCT 37.7* 06/17/2012 2206   PLT 96* 06/17/2012 2206    BMET    Component Value Date/Time   NA 138 06/18/2012 0320   K 3.6 06/18/2012 0320   CL 97 06/18/2012 0320   CO2 35* 06/18/2012 0320   GLUCOSE 112* 06/18/2012 0320   BUN  8 06/18/2012 0320   CREATININE 0.73 06/18/2012 0320   CALCIUM 8.2* 06/18/2012 0320   GFRNONAA 85* 06/18/2012 0320   GFRAA >90 06/18/2012 0320    Brian Chen, MD [facility] Office: [phone] Pager: [phone]  06/18/2012, 7:55 AM     

## 2012-06-18 NOTE — Consult Note (Signed)
ANTIBIOTIC CONSULT NOTE-PROGRESS NOTE  Pharmacy Consult for Zinacef Indication: Post-op R-AKA prophylaxis  Allergies No Known Allergies  Patient Measurements: Height: 5\' 11"  (180.3 cm) Weight: 180 lb 8.9 oz (81.9 kg) IBW/kg (Calculated) : 75.3   Vital Signs: BP 103/48  Pulse 79  Temp 97.6 F (36.4 C) (Oral)  Resp 17  Ht 5\' 11"  (1.803 m)  Wt 180 lb 8.9 oz (81.9 kg)  BMI 25.18 kg/m2  SpO2 92%  Labs:  Basename 06/18/12 0320 06/17/12 2206 06/17/12 1528 06/17/12 0518  WBC -- 9.9 7.8 12.6*  CREATININE 0.73 -- 0.79 0.77   Estimated Creatinine Clearance: 78.4 ml/min (by C-G formula based on Cr of 0.73).   Anti-infectives Anti-infectives     Start     Dose/Rate Route Frequency Ordered Stop   06/18/12 2000   cefUROXime (ZINACEF) 1.5 g in dextrose 5 % 50 mL IVPB     1.5 g 100 mL/hr over 30 Minutes Intravenous Every 12 hrs  10/25 1737pm 10/26 1959pm        Assessment:  S/p R-AKA in 78 y/o male.  Crcl > 75 ml/min.  Placed on Zinacef x 2 post-op with Pharmacy review for dose adjustments if needed.  Plan:   Continue Zinacef as ordered x 2.  No dose adjustments required.  Pharmacy will sign off.  Please call for any further questions.   Laurena Bering, Pharm.D.  06/18/2012 6:09 PM

## 2012-06-18 NOTE — Progress Notes (Deleted)
Per RN MD deflated trach.

## 2012-06-18 NOTE — Progress Notes (Signed)
PT Cancellation/Discharge Note  Patient Details Name: Stephen Dean MRN: [id number] DOB: Dec 20, 1928   Cancelled Treatment:    Reason Eval/Treat Not Completed: Patient at procedure or test/unavailable;Medical issues which prohibited therapy.  Patient in OR for Rt. AKA.  PT will sign off.   **MD:  Please reorder PT consult post-op when appropriate for patient.  Thank you.   Vena Austria 06/18/2012, 12:22 PM [phone]

## 2012-06-18 NOTE — Progress Notes (Signed)
INITIAL ADULT NUTRITION ASSESSMENT Date: 06/18/2012   Time: 1:22 PM  INTERVENTION:  Advance diet as medically appropriate RD to follow for nutrition care plan, add interventions/make recommendations accordingly  DOCUMENTATION CODES Per approved criteria  -Not Applicable   Reason for Assessment: Malnutrition Screening Tool Report  ASSESSMENT: Male 78 y.o.  Dx: R leg ischemia   Hx: History reviewed. No pertinent past medical history.  Related Meds:     . aspirin  81 mg Oral Daily  . atenolol  50 mg Oral Daily  .  ceFAZolin (ANCEF) IV  1 g Intravenous Q8H  . dextrose 5 % and 0.45% NaCl   Intravenous Once  . docusate sodium  100 mg Oral Daily  . enoxaparin (LOVENOX) injection  40 mg Subcutaneous Q24H  . lisinopril  10 mg Oral Daily  . morphine   Intravenous Q4H  . omega-3 acid ethyl esters  1 g Oral BID  . pantoprazole  40 mg Oral Daily  . simvastatin  20 mg Oral q1800    Ht: not recorded  Wt: 180 lb 8.9 oz (81.9 kg)  Ideal Wt: unable to calculate % Ideal Wt: ---  Usual Wt: unable to obtain % Usual Wt: ---  BMI = unable to calculate  Food/Nutrition Related Hx: recent weight lost without trying and decreased appetite per admission nutrition screen  Labs:  CMP     Component Value Date/Time   NA 138 06/18/2012 0320   K 3.6 06/18/2012 0320   CL 97 06/18/2012 0320   CO2 35* 06/18/2012 0320   GLUCOSE 112* 06/18/2012 0320   BUN 8 06/18/2012 0320   CREATININE 0.73 06/18/2012 0320   CALCIUM 8.2* 06/18/2012 0320   PROT 5.3* 06/18/2012 0320   ALBUMIN 3.0* 06/18/2012 0320   AST 547* 06/18/2012 0320   ALT 111* 06/18/2012 0320   ALKPHOS 44 06/18/2012 0320   BILITOT 1.4* 06/18/2012 0320   GFRNONAA 85* 06/18/2012 0320   GFRAA >90 06/18/2012 0320     Intake/Output Summary (Last 24 hours) at 06/18/12 1329 Last data filed at 06/18/12 1324  Gross per 24 hour  Intake 5608.5 ml  Output   3060 ml  Net 2548.5 ml    Diet Order: NPO  Supplements/Tube Feeding:  N/A  IVF:    sodium chloride Last Rate: Stopped (06/18/12 0950)  DOPamine   sodium bicarbonate infusion 1000 mL Last Rate: 125 mL/hr at 06/18/12 0400  DISCONTD: sodium chloride Last Rate: Stopped (06/17/12 0736)  DISCONTD: lactated ringers Last Rate: 50 mL/hr at 06/17/12 0953    Estimated Nutritional Needs:   Kcal: 2000-2200 Protein: 100-110 gm Fluid: 2.0-2.2 L  Patient transferred from [facility] ED for evaluation of thrombosed R femoral artery aneurysm with cold pulseless foot;  S/p several procedures 10/24: 1. Right anterior tibial artery thrombectomy  2. Exclusion of popliteal artery  3. Right common femoral artery to below-the-knee popliteal artery bypass  4. Open cannulation of right common femoral artery  5. Right leg runoff  6. Right four-compartment fasciotomy  RD unable to obtain nutrition hx; currently in PERIOP; s/p above knee amputation 10/25.  NUTRITION DIAGNOSIS: -Inadequate oral intake (NI-2.1).  Status: Ongoing  RELATED TO: inability to eat  AS EVIDENCE BY: NPO status  MONITORING/EVALUATION(Goals): Goal: Nutrition support vs oral intake to meet >/= 90% of estimated nutrition needs Monitor: Nutrition support initiation, PO diet advancement, weight, labs, I/O's  EDUCATION NEEDS: -No education needs identified at this time  Dietitian #: [phone]  Kirkland Hun, RD, LDN Pager #: [phone]  After-Hours Pager #: [phone]

## 2012-06-18 NOTE — Interval H&P Note (Signed)
History and Physical Interval Note:  06/18/2012 10:51 AM  Stephen Dean  has presented today for surgery, with the diagnosis of Peripheral Arterial Disease Ischemic right lower extremity   The various methods of treatment have been discussed with the patient and family. After consideration of risks, benefits and other options for treatment, the patient has consented to  Procedure(s) (LRB) with comments: AMPUTATION ABOVE KNEE (Right) as a surgical intervention .  The patient's history has been reviewed, patient examined, no change in status, stable for surgery.  I have reviewed the patient's chart and labs.  Questions were answered to the patient's satisfaction.     [name]

## 2012-06-18 NOTE — Preoperative (Signed)
Beta Blockers   Reason not to administer Beta Blockers:Not Applicable 

## 2012-06-18 NOTE — Progress Notes (Signed)
OT Cancellation Note  Patient Details Name: Stephen Dean MRN: [id number] DOB: May 06, 1929   Cancelled Treatment:     Order received.  Pt. Was taken to OR for AKA.  Will sign off.  MD, please re-order when medically appropriate  Boykin Reaper [phone] 06/18/2012, 2:57 PM

## 2012-06-18 NOTE — Op Note (Signed)
OPERATIVE REPORT  Date of Surgery: 06/17/2012 - 06/18/2012  Surgeon: [name], MD  Assistant: Lianne Cure PA  Pre-op Diagnosis: Nonviable right lower extremity secondary to thrombosed right popliteal aneurysm  Post-op Diagnosis: Same Procedure: Procedure(s): AMPUTATION ABOVE KNEE  Anesthesia: General  EBL: 200 cc Drains-one Hemovac  Complications: None  Procedure Details: The patient was taken to the operating room placed in supine position at which time satisfactory general endotracheal anesthesia was measured. Right lower extremity was prepped with Betadine scrub and solution draped in routine sterile manner isolating the below knee portion with an impervious stockinette. Skin incisions were made in the mid to distal thigh with equal anterior and posterior flaps placing the femur approximately 4-6 inches proximal to the knee joint. Incision was carried down through skin subcutaneous tissue with a scalpel. The fascia and muscle were divided with Bovie. Femoris clamp proximally with a periosteal elevator and divided with a Stryker saw and smoothed with a rasp. The functioning Gore-Tex femoral-popliteal graft was identified and clamped proximally and distally transected and oversewn with 2 layers of 2-0 silk. It was functional. The distal superficial femoral artery which was a large aneurysmal vessel was then dissected free and excised back to the neck of this aneurysm which was oversewn with 2 layers of 2-0 silk. Popliteal plane was also ligated 2-0 Prolene suture. Posterior muscles were divided with Bovie. Specimen was removed from the table. Adequate hemostasis was achieved medium Hemovac drain was brought out medially and laterally secured with silk sutures. Fascia was closed over the bone with interrupted 2-0 Vicryl the skin with 2-0 Vicryl in subcuticular fashion and skin staples. Sterile dressing applied patient taken to the recovery room in stable condition   [name],  MD 06/18/2012 12:55 PM

## 2012-06-18 NOTE — Progress Notes (Signed)
[facility] [place]  Daily Progress Note  Assessment/Planning: POD #1 s/p R AT thrombectomy, R fem-BK pop with Propaten, R leg angiogram, R 4-compartment fasciotomies   Warm viable foot with palpable AT   Unfortunately, patient's entire calf muscle was dead on exploration as evident by CK > 20K.  Fortunately, with the bicarb. drip overnight no kidney failure has started  I discussed with the patient proceeding with R AKA to avoid kidney failure due to myoglobin release from dead muscle tissue.  At this point, he has has agreed to proceed. I discussed in depth the nature of above-the-knee amputation with the patient, including risks, benefits, and alternatives.   The patient is aware that the risks of above-the-knee amputation include but are not limited to: bleeding, infection, myocardial infarction, stroke, death, failure to heal amputation wound, and possible need for more proximal amputation.   The patient is aware of the risks and agrees to proceed forward with the procedure.  Subjective  - 1 Day Post-Op  Pain is minimal  Objective Filed Vitals:   06/18/12 0500 06/18/12 0600 06/18/12 0700 06/18/12 0750  BP: 120/57 116/48 115/50   Pulse: 97 76 85   Temp:    98.6 F (37 C)  TempSrc:    Oral  Resp: 15 19 17    Weight:  180 lb 8.9 oz (81.9 kg)    SpO2: 96% 96% 95%     Intake/Output Summary (Last 24 hours) at 06/18/12 0755 Last data filed at 06/18/12 0700  Gross per 24 hour  Intake 5532.33 ml  Output   3125 ml  Net 2407.33 ml    PULM  CTAB CV  RRR GI  soft, NTND VASC  R foot warm, calf bandaged, palpable AT  Laboratory CBC    Component Value Date/Time   WBC 9.9 06/17/2012 2206   HGB 13.0 06/17/2012 2206   HCT 37.7* 06/17/2012 2206   PLT 96* 06/17/2012 2206    BMET    Component Value Date/Time   NA 138 06/18/2012 0320   K 3.6 06/18/2012 0320   CL 97 06/18/2012 0320   CO2 35* 06/18/2012 0320   GLUCOSE 112* 06/18/2012 0320   BUN  8 06/18/2012 0320   CREATININE 0.73 06/18/2012 0320   CALCIUM 8.2* 06/18/2012 0320   GFRNONAA 85* 06/18/2012 0320   GFRAA >90 06/18/2012 0320    Leonides Sake, MD [facility] Office: [phone] Pager: [phone]  06/18/2012, 7:55 AM

## 2012-06-18 NOTE — Anesthesia Preprocedure Evaluation (Addendum)
Anesthesia Evaluation  Patient identified by MRN, date of birth, ID band Patient awake    Reviewed: Allergy & Precautions, H&P , NPO status , Patient's Chart, lab work & pertinent test results  Airway Mallampati: II TM Distance: >3 FB Neck ROM: Full    Dental  (+) Partial Lower, Partial Upper and Dental Advisory Given   Pulmonary Current Smoker,  breath sounds clear to auscultation        Cardiovascular + CAD Rhythm:Irregular Rate:Normal     Neuro/Psych    GI/Hepatic   Endo/Other    Renal/GU      Musculoskeletal   Abdominal   Peds  Hematology   Anesthesia Other Findings   Reproductive/Obstetrics                          Anesthesia Physical Anesthesia Plan  ASA: III  Anesthesia Plan: General   Post-op Pain Management:    Induction: Intravenous  Airway Management Planned: Oral ETT  Additional Equipment:   Intra-op Plan:   Post-operative Plan: Extubation in OR  Informed Consent: I have reviewed the patients History and Physical, chart, labs and discussed the procedure including the risks, benefits and alternatives for the proposed anesthesia with the patient or authorized representative who has indicated his/her understanding and acceptance.   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 Unsuccessful Fem-pop and thrombectomies with fasciotomies 10/24 Smoker  Plan GA with oral ETT  Kipp Brood, MD)      Anesthesia Quick Evaluation

## 2012-06-18 NOTE — Anesthesia Postprocedure Evaluation (Signed)
  Anesthesia Post-op Note  Patient: Stephen Dean  Procedure(s) Performed: Procedure(s) (LRB) with comments: EMBOLECTOMY (Right) - Thrombectomy of right tibial arteries,exclusion of right popliteal aneurysm, femoral -popliteal artery bypass graft,four compartment fasciotomies BYPASS GRAFT FEMORAL-POPLITEAL ARTERY (Right) - Using 6 mm x 80 cm propaten goretex graft INTRA OPERATIVE ARTERIOGRAM (Right) FASCIOTOMY (Right) - four compartment  fasciotomy  Patient Location: PACU  Anesthesia Type: General  Level of Consciousness: awake  Airway and Oxygen Therapy: Patient Spontanous Breathing  Post-op Pain: mild  Post-op Assessment: Post-op Vital signs reviewed, Patient's Cardiovascular Status Stable, Respiratory Function Stable, Patent Airway, No signs of Nausea or vomiting and Pain level controlled  Post-op Vital Signs: stable  Complications: No apparent anesthesia complications

## 2012-06-19 ENCOUNTER — Encounter (HOSPITAL_COMMUNITY): Payer: Self-pay | Admitting: Acute Care

## 2012-06-19 ENCOUNTER — Inpatient Hospital Stay (HOSPITAL_COMMUNITY): Payer: Medicare Other

## 2012-06-19 DIAGNOSIS — R0602 Shortness of breath: Secondary | ICD-10-CM | POA: Diagnosis not present

## 2012-06-19 LAB — BASIC METABOLIC PANEL
BUN: 9 mg/dL (ref 6–23)
Calcium: 8 mg/dL — ABNORMAL LOW (ref 8.4–10.5)
Creatinine, Ser: 0.68 mg/dL (ref 0.50–1.35)
GFR calc Af Amer: >90 mL/min (ref >90)
GFR calc non Af Amer: 88 mL/min — ABNORMAL LOW (ref >90)

## 2012-06-19 LAB — EXPECTORATED SPUTUM ASSESSMENT W GRAM STAIN, RFLX TO RESP C

## 2012-06-19 LAB — CBC
HCT: 34 % — ABNORMAL LOW (ref 39.0–52.0)
MCHC: 35 g/dL (ref 30.0–36.0)
Platelets: 86 10*3/uL — ABNORMAL LOW (ref 150–400)
RDW: 13.6 % (ref 11.5–15.5)
WBC: 13.9 10*3/uL — ABNORMAL HIGH (ref 4.0–10.5)

## 2012-06-19 MED ORDER — NICOTINE 21 MG/24HR TD PT24
21.0000 mg | MEDICATED_PATCH | Freq: Every day | TRANSDERMAL | Status: DC
  Administered 2012-06-19 – 2012-06-21 (×3): 21 mg via TRANSDERMAL
  Filled 2012-06-19 (×3): qty 1

## 2012-06-19 NOTE — Progress Notes (Addendum)
[facility] [place]  Postoperative Visit - Amputation  Date of Surgery: 06/17/2012 - 06/18/2012 Procedure(s): AMPUTATION ABOVE KNEE Right Surgeon: Surgeon(s): Pryor Ochoa, MD POD: 1 Day Post-Op  Subjective Stephen Dean is a 78 y.o. male who is S/P Right Procedure(s): AMPUTATION ABOVE KNEE.  Pt.denies increased pain in the stump. The patient notes pain is well controlled. Pt. reports phantom pain.  Significant Diagnostic Studies: CBC Lab Results  Component Value Date   WBC 13.9* 06/19/2012   HGB 11.9* 06/19/2012   HCT 34.0* 06/19/2012   MCV 96.6 06/19/2012   PLT 86* 06/19/2012    BMET    Component Value Date/Time   NA 135 06/19/2012 0600   K 3.8 06/19/2012 0600   CL 96 06/19/2012 0600   CO2 30 06/19/2012 0600   GLUCOSE 77 06/19/2012 0600   BUN 9 06/19/2012 0600   CREATININE 0.68 06/19/2012 0600   CALCIUM 8.0* 06/19/2012 0600   GFRNONAA 88* 06/19/2012 0600   GFRAA >90 06/19/2012 0600    COAG No results found for this basename: INR, PROTIME   No results found for this basename: PTT     Intake/Output Summary (Last 24 hours) at 06/19/12 0932 Last data filed at 06/19/12 0900  Gross per 24 hour  Intake 3624.17 ml  Output   1825 ml  Net 1799.17 ml   No data found.    Physical Examination  BP Readings from Last 3 Encounters:  06/19/12 120/53  06/19/12 120/53  06/19/12 120/53   Temp Readings from Last 3 Encounters:  06/19/12 98.4 F (36.9 C) Oral  06/19/12 98.4 F (36.9 C) Oral  06/19/12 98.4 F (36.9 C) Oral   SpO2 Readings from Last 3 Encounters:  06/19/12 83%  06/19/12 83%  06/19/12 83%   Pulse Readings from Last 3 Encounters:  06/19/12 100  06/19/12 100  06/19/12 100    Pt is A&Ox3  WDWN male with no complaints  Right amputation wound is healing well.  There is good bone coverage in the stump Stump is warm and well perfused, without drainage; without erythema   Assessment/plan:  Stephen Dean is  a 78 y.o. male who is s/p Right Procedure(s): AMPUTATION ABOVE KNEE Will change dressing in am PT/OT/CIR  The patient's stump is viable.  Follow-up 4 weeks from surgery  Marlowe Shores 9:32 AM 06/19/[phone]  Agree with above Mild to moderate discomfort in the right AKA stump Right inguinal wound looks good  Plan transfer to 2000 today and will check wound tomorrow and DC drains Will also need rehabilitation medicine consult-patient good candidate for prosthesis-hopefully will qualify for CIR

## 2012-06-19 NOTE — Progress Notes (Signed)
Orthopedic Tech Progress Note Patient Details:  Stephen Dean 06/03/1929 [id number]  Patient ID: Stephen Dean, male   DOB: Nov 24, 1928, 78 y.o.   MRN: [id number] calld bio-tech with brace order;spoke with Tammy Sours @1435   Stephen Dean 06/19/2012, 5:19 PM

## 2012-06-19 NOTE — Progress Notes (Signed)
Pt transferred to room 2007, per MD order. Report called to receiving nurse and all questions answered. Family aware of transfer.

## 2012-06-19 NOTE — Progress Notes (Signed)
Physical Therapy Evaluation Patient Details Name: Stephen Dean MRN: [id number] DOB: 1929-04-20 Today's Date: 06/19/2012 Time: 4098-1191 PT Time Calculation (min): 32 min  PT Assessment / Plan / Recommendation Clinical Impression  Pt is 78 yo male s/p right AKA who will benefit from acute PT to increase ability to mobilize within and OOB to increase independence in preparation for next venue. Recommend CIR consult.    PT Assessment  Patient needs continued PT services    Follow Up Recommendations  Post acute inpatient    Does the patient have the potential to tolerate intense rehabilitation   Yes, Recommend IP Rehab Screening  Barriers to Discharge None      Equipment Recommendations  Rolling walker with 5" wheels    Recommendations for Other Services OT consult   Frequency Min 3X/week    Precautions / Restrictions Precautions Precautions: Fall   Pertinent Vitals/Pain No c/o pain on PCA      Mobility  Bed Mobility Bed Mobility: Rolling Left;Left Sidelying to Sit;Sitting - Scoot to Edge of Bed Rolling Left: 1: +2 Total assist;With rail Rolling Left: Patient Percentage: 50% Left Sidelying to Sit: 1: +2 Total assist;HOB flat;With rails Left Sidelying to Sit: Patient Percentage: 50% Sitting - Scoot to Edge of Bed: 1: +2 Total assist Sitting - Scoot to Edge of Bed: Patient Percentage: 60% Details for Bed Mobility Assistance: vc's for sequencing and protection of residual limb Transfers Transfers: Sit to Stand;Stand to Sit;Stand Pivot Transfers Sit to Stand: 1: +2 Total assist;From bed;From elevated surface;With upper extremity assist Sit to Stand: Patient Percentage: 50% Stand to Sit: 1: +2 Total assist;To chair/3-in-1;With upper extremity assist Stand to Sit: Patient Percentage: 60% Stand Pivot Transfers: 1: +2 Total assist Stand Pivot Transfers: Patient Percentage: 60% Details for Transfer Assistance: pt transferred bed to chair with RW and +2 assist, pt was  able to take full wt through UE's to take small hops. vc's for sequencing and specific instruction as pt was very anxious about mobility Ambulation/Gait Ambulation/Gait Assistance: Not tested (comment) Assistive device: Rolling walker Stairs: No Wheelchair Mobility Wheelchair Mobility: No    Shoulder Instructions     Exercises General Exercises - Lower Extremity Ankle Circles/Pumps: AROM;10 reps;Seated;Left Quad Sets: AROM;10 reps;Left;Seated Gluteal Sets: AROM;Both;10 reps;Seated Long Arc Quad: AROM;10 reps;Seated;Left (with controlled descent)   PT Diagnosis: Difficulty walking;Acute pain  PT Problem List: Decreased activity tolerance;Decreased balance;Decreased mobility;Decreased knowledge of use of DME;Decreased knowledge of precautions;Pain PT Treatment Interventions: DME instruction;Gait training;Functional mobility training;Therapeutic activities;Therapeutic exercise;Balance training;Patient/family education   PT Goals Acute Rehab PT Goals PT Goal Formulation: With patient/family Time For Goal Achievement: 07/03/12 Potential to Achieve Goals: Good Pt will go Supine/Side to Sit: with supervision PT Goal: Supine/Side to Sit - Progress: Goal set today Pt will go Sit to Supine/Side: with supervision PT Goal: Sit to Supine/Side - Progress: Goal set today Pt will go Sit to Stand: with min assist PT Goal: Sit to Stand - Progress: Goal set today Pt will go Stand to Sit: with min assist PT Goal: Stand to Sit - Progress: Goal set today Pt will Transfer Bed to Chair/Chair to Bed: with min assist PT Transfer Goal: Bed to Chair/Chair to Bed - Progress: Goal set today Pt will Ambulate: 1 - 15 feet;with rolling walker;with min assist PT Goal: Ambulate - Progress: Goal set today  Visit Information  Last PT Received On: 06/19/12 Assistance Needed: +2    Subjective Data  Subjective: pt hesitant about trying to get up Patient Stated Goal: return  home   Prior Functioning  Home  Living Lives With: Spouse Available Help at Discharge: Family;Available 24 hours/day Type of Home: House Home Access: Stairs to enter [employer] of Steps: 1 Home Layout: One level Home Adaptive Equipment: None Prior Function Level of Independence: Independent Able to Take Stairs?: Yes Vocation: Retired Musician: No difficulties;HOH    Cognition  Overall Cognitive Status: Appears within functional limits for tasks assessed/performed Arousal/Alertness: Awake/alert Orientation Level: Appears intact for tasks assessed Behavior During Session: Anxious    Extremity/Trunk Assessment Right Upper Extremity Assessment RUE ROM/Strength/Tone: WFL for tasks assessed Left Upper Extremity Assessment LUE ROM/Strength/Tone: WFL for tasks assessed Right Lower Extremity Assessment RLE ROM/Strength/Tone: Deficits RLE ROM/Strength/Tone Deficits: discussed proper positioning of residual limb as well as preparation for prosthesis Left Lower Extremity Assessment LLE ROM/Strength/Tone: WFL for tasks assessed LLE Sensation: WFL - Light Touch LLE Coordination: WFL - gross motor Trunk Assessment Trunk Assessment: Normal   Balance Balance Balance Assessed: Yes Static Sitting Balance Static Sitting - Balance Support: Bilateral upper extremity supported (left foot supported) Static Sitting - Level of Assistance: 5: Stand by assistance  End of Session PT - End of Session Equipment Utilized During Treatment: Gait belt;Oxygen Activity Tolerance: Patient tolerated treatment well Patient left: in chair;with call bell/phone within reach;with family/visitor present Nurse Communication: Mobility status  GP   Lyanne Co, PT  Acute Rehab Services  [phone]   Lyanne Co 06/19/2012, 4:31 PM

## 2012-06-19 NOTE — Progress Notes (Signed)
Dr. Hart [place] paged to make aware of oxygen needs at this time; sats of 4L [place] down at times to 86%; venti mask placed; sats up to upper 90's; order for chest xray obtained; will cont. To monitor.

## 2012-06-20 LAB — BASIC METABOLIC PANEL
BUN: 10 mg/dL (ref 6–23)
Chloride: 96 mEq/L (ref 96–112)
GFR calc Af Amer: >90 mL/min (ref >90)
GFR calc non Af Amer: >90 mL/min (ref >90)
Potassium: 3.4 mEq/L — ABNORMAL LOW (ref 3.5–5.1)

## 2012-06-20 LAB — CBC
HCT: 33.9 % — ABNORMAL LOW (ref 39.0–52.0)
Hemoglobin: 11.7 g/dL — ABNORMAL LOW (ref 13.0–17.0)
MCHC: 34.5 g/dL (ref 30.0–36.0)
RDW: 13 % (ref 11.5–15.5)
WBC: 11.9 10*3/uL — ABNORMAL HIGH (ref 4.0–10.5)

## 2012-06-20 MED ORDER — MOXIFLOXACIN HCL 400 MG PO TABS
400.0000 mg | ORAL_TABLET | Freq: Every day | ORAL | Status: DC
  Administered 2012-06-20: 400 mg via ORAL
  Filled 2012-06-20 (×2): qty 1

## 2012-06-20 MED ORDER — POTASSIUM CHLORIDE CRYS ER 20 MEQ PO TBCR
20.0000 meq | EXTENDED_RELEASE_TABLET | Freq: Two times a day (BID) | ORAL | Status: AC
  Administered 2012-06-20 (×2): 20 meq via ORAL

## 2012-06-20 MED ORDER — MORPHINE SULFATE 2 MG/ML IJ SOLN: 2.0000 mg | INTRAMUSCULAR | Status: DC | PRN

## 2012-06-20 NOTE — Progress Notes (Addendum)
[facility] [place]  Postoperative Visit - Amputation  Date of Surgery: 06/17/2012 - 06/18/2012 Procedure(s): AMPUTATION ABOVE KNEE Right Surgeon: Surgeon(s): Pryor Ochoa, MD POD: 2 Days Post-Op  Subjective Stephen Dean is a 78 y.o. male who is S/P Right Procedure(s): AMPUTATION ABOVE KNEE.  Pt.denies increased pain in the stump. The patient notes pain is well controlled. Pt. reports phantom pain.  Significant Diagnostic Studies: CBC Lab Results  Component Value Date   WBC 11.9* 06/20/2012   HGB 11.7* 06/20/2012   HCT 33.9* 06/20/2012   MCV 94.2 06/20/2012   PLT 109* 06/20/2012    BMET    Component Value Date/Time   NA 135 06/20/2012 0620   K 3.4* 06/20/2012 0620   CL 96 06/20/2012 0620   CO2 33* 06/20/2012 0620   GLUCOSE 100* 06/20/2012 0620   BUN 10 06/20/2012 0620   CREATININE 0.62 06/20/2012 0620   CALCIUM 8.3* 06/20/2012 0620   GFRNONAA >90 06/20/2012 0620   GFRAA >90 06/20/2012 0620    COAG No results found for this basename: INR, PROTIME   No results found for this basename: PTT     Intake/Output Summary (Last 24 hours) at 06/20/12 0929 Last data filed at 06/20/12 0732  Gross per 24 hour  Intake    180 ml  Output    250 ml  Net    -70 ml   No data found.    Physical Examination  BP Readings from Last 3 Encounters:  06/20/12 125/62  06/20/12 125/62  06/20/12 125/62   Temp Readings from Last 3 Encounters:  06/20/12 99 F (37.2 C) Axillary  06/20/12 99 F (37.2 C) Axillary  06/20/12 99 F (37.2 C) Axillary   SpO2 Readings from Last 3 Encounters:  06/20/12 95%  06/20/12 95%  06/20/12 95%   Pulse Readings from Last 3 Encounters:  06/20/12 101  06/20/12 101  06/20/12 101    Pt is A&Ox3  WDWN male with no complaints  Right amputation wound is healing well.  There is good bone coverage in the stump Stump is warm and well perfused, without drainage; without erythema   Assessment/plan:  Stephen Dean is a 78 y.o. male who is s/p Right Procedure(s): AMPUTATION ABOVE KNEE  The patient's stump is viable.  CXR shows consolidation in RLL - poss pneumonia  On nicotine patch  Will DC PCA as pt not using very much  Low grade temp  IS and will begin avelox  Follow-up 4 weeks from surgery  Marlowe Shores 9:29 AM 06/20/[phone]  Agree with above Inpatient rehabilitation consult pending-patient good candidate for prosthesis Patient having short bursts of SVT-potassium-3.4 will replace  Stump looks good and drains removed today

## 2012-06-20 NOTE — Progress Notes (Signed)
Occupational Therapy Evaluation Patient Details Name: Stephen Dean MRN: [id number] DOB: Oct 20, 1928 Today's Date: 06/20/2012 Time: 6213-0865 OT Time Calculation (min): 26 min  OT Assessment / Plan / Recommendation Clinical Impression  78 yo s/p R AKA due to anuersym. PTA, pt totally independent, including driving. Feel pt is excellent CIR candidate. Pt motivated to return to PLOF. Feel that pt will be able to achieve MOD I goals with CIR stay. Pt in agreement and asking "when  can I start". Pt will benefit from skilled OT services to max independence with ADL and functinal mobilty for ADL to facilitate D/C to CIR.     OT Assessment  Patient needs continued OT Services    Follow Up Recommendations  Inpatient Rehab    Barriers to Discharge None    Equipment Recommendations  Rolling walker with 5" wheels;3 in 1 bedside comode;Tub/shower bench;Wheelchair (measurements);Wheelchair cushion (measurements)    Recommendations for Other Services Rehab consult  Frequency  Min 2X/week    Precautions / Restrictions Precautions Precautions: Fall   Pertinent Vitals/Pain None. Pt just received pain meds @ 1 hour prior    ADL  Grooming: Set up Where Assessed - Grooming: Supported sitting Upper Body Bathing: Set up Where Assessed - Upper Body Bathing: Unsupported sitting Lower Body Bathing: Maximal assistance Where Assessed - Lower Body Bathing: Supported sit to stand Upper Body Dressing: Set up Where Assessed - Upper Body Dressing: Unsupported sitting Lower Body Dressing: Maximal assistance Where Assessed - Lower Body Dressing: Supported sit to Pharmacist, hospital: Maximal assistance Toilet Transfer Method: Sit to stand;Stand pivot Acupuncturist: Other (comment) (bed - chair) Toileting - Clothing Manipulation and Hygiene: Maximal assistance Where Assessed - Toileting Clothing Manipulation and Hygiene: Sit to stand from 3-in-1 or toilet;Lean right and/or left Equipment  Used: Rolling walker;Gait belt Transfers/Ambulation Related to ADLs: sit - stand mod A. Stand pivot Max A ADL Comments: Limited LB ADL. May benefit from AE    OT Diagnosis: Generalized weakness;Acute pain  OT Problem List: Decreased strength;Decreased activity tolerance;Decreased safety awareness;Decreased knowledge of use of DME or AE;Decreased knowledge of precautions;Pain OT Treatment Interventions: Self-care/ADL training;Therapeutic exercise;Energy conservation;DME and/or AE instruction;Therapeutic activities;Patient/family education;Balance training   OT Goals Acute Rehab OT Goals OT Goal Formulation: With patient Time For Goal Achievement: 07/04/12 Potential to Achieve Goals: Good ADL Goals Pt Will Perform Lower Body Bathing: Supine, head of bed up;Supine, rolling right and/or left;Unsupported;with supervision ADL Goal: Lower Body Bathing - Progress: Goal set today Pt Will Perform Lower Body Dressing: Supine, rolling right and/or left;with supervision;Unsupported;with cueing (comment type and amount) ADL Goal: Lower Body Dressing - Progress: Goal set today Pt Will Transfer to Toilet: Drop arm 3-in-1;with DME;Squat pivot transfer;with min assist ADL Goal: Toilet Transfer - Progress: Goal set today Pt Will Perform Toileting - Clothing Manipulation: with supervision;Sitting on 3-in-1 or toilet ADL Goal: Toileting - Clothing Manipulation - Progress: Goal set today Pt Will Perform Toileting - Hygiene: with supervision;Sitting on 3-in-1 or toilet;Leaning right and/or left on 3-in-1/toilet ADL Goal: Toileting - Hygiene - Progress: Goal set today Additional ADL Goal #1: complete dynamic ADL task sitting EOB with stand by assist for balance with reaching 1 foot out of BOS. ADL Goal: Additional Goal #1 - Progress: Goal set today  Visit Information  Last OT Received On: 06/20/12    Subjective Data    when can I get started with rehab?   Prior Functioning     Home Living Lives With:  Spouse Available Help at Discharge:  Family;Available 24 hours/day Type of Home: House Home Access: Stairs to enter [employer] of Steps: 1 Home Layout: One level Bathroom Shower/Tub: Tub/shower unit;Curtain Firefighter: Standard Home Adaptive Equipment: Walker - rolling Prior Function Level of Independence: Independent Able to Take Stairs?: Yes Driving: Yes Vocation: Retired Musician: HOH Dominant Hand: Right         Vision/Perception  WFL   Cognition  Overall Cognitive Status: Appears within functional limits for tasks assessed/performed Arousal/Alertness: Awake/alert Orientation Level: Appears intact for tasks assessed  Pt with delayed processing and decreased recall of sequence for transfers. Feel this is most likely due to pain medicines. Nsg aware.   Extremity/Trunk Assessment Right Upper Extremity Assessment RUE ROM/Strength/Tone: WFL for tasks assessed RUE Sensation: WFL - Light Touch;WFL - Proprioception RUE Coordination: WFL - gross/fine motor Left Upper Extremity Assessment LUE ROM/Strength/Tone: WFL for tasks assessed LUE Sensation: WFL - Light Touch;WFL - Proprioception LUE Coordination: WFL - gross/fine motor Right Lower Extremity Assessment RLE ROM/Strength/Tone: Deficits (AKA) Left Lower Extremity Assessment LLE ROM/Strength/Tone: WFL for tasks assessed Trunk Assessment Trunk Assessment: Normal     Mobility Bed Mobility Bed Mobility: Supine to Sit;Sitting - Scoot to Edge of Bed Rolling Left: 5: Supervision;With rail Supine to Sit: 5: Supervision;HOB elevated;With rails Sitting - Scoot to Edge of Bed: 5: Supervision;With rail Transfers Transfers: Sit to Stand;Stand to Sit Sit to Stand: With upper extremity assist;From bed;3: Mod assist Stand to Sit: 2: Max assist;With upper extremity assist;To chair/3-in-1 Details for Transfer Assistance: difficulty with transitional movement s with RW.     Shoulder  Instructions     Exercise     Balance Static Sitting Balance Static Sitting - Balance Support: No upper extremity supported;Feet supported (L foot supported. S) Static Sitting - Level of Assistance: 5: Stand by assistance   End of Session OT - End of Session Equipment Utilized During Treatment: Gait belt Activity Tolerance: Patient tolerated treatment well Patient left: in chair;with call bell/phone within reach Nurse Communication: Mobility status;Precautions  GO     Hebe Merriwether,HILLARY 06/20/2012, 5:51 PM [facility], OTR/L  [phone] 06/20/2012

## 2012-06-20 NOTE — Progress Notes (Signed)
Rehab Admissions Coordinator Note:  Patient was screened by Clois Dupes for appropriateness for an Inpatient Acute Rehab Consult. Noted PT recommends an inpt rehab consult and it is pending for Monday. Await OT evaluation also. At this time, we are recommending Inpatient Rehab consult.  Clois Dupes, RN 06/20/2012, 5:48 PM  I can be reached at [phone].

## 2012-06-21 ENCOUNTER — Telehealth: Payer: Self-pay | Admitting: Vascular Surgery

## 2012-06-21 ENCOUNTER — Encounter (HOSPITAL_COMMUNITY): Payer: Self-pay | Admitting: Vascular Surgery

## 2012-06-21 DIAGNOSIS — I724 Aneurysm of artery of lower extremity: Secondary | ICD-10-CM | POA: Diagnosis not present

## 2012-06-21 DIAGNOSIS — S78119A Complete traumatic amputation at level between unspecified hip and knee, initial encounter: Secondary | ICD-10-CM | POA: Diagnosis not present

## 2012-06-21 DIAGNOSIS — L98499 Non-pressure chronic ulcer of skin of other sites with unspecified severity: Secondary | ICD-10-CM

## 2012-06-21 DIAGNOSIS — I739 Peripheral vascular disease, unspecified: Secondary | ICD-10-CM

## 2012-06-21 MED ORDER — ATENOLOL 50 MG PO TABS
50.0000 mg | ORAL_TABLET | Freq: Every day | ORAL | Status: DC
  Administered 2012-06-22: 50 mg via ORAL
  Filled 2012-06-21: qty 1

## 2012-06-21 MED ORDER — POLYETHYLENE GLYCOL 3350 17 G PO PACK
17.0000 g | PACK | Freq: Every day | ORAL | Status: DC | PRN
  Filled 2012-06-21: qty 1

## 2012-06-21 MED ORDER — PHENOL 1.4 % MT LIQD
1.0000 | OROMUCOSAL | Status: DC | PRN
  Filled 2012-06-21: qty 177

## 2012-06-21 MED ORDER — POTASSIUM CHLORIDE CRYS ER 20 MEQ PO TBCR
20.0000 meq | EXTENDED_RELEASE_TABLET | Freq: Two times a day (BID) | ORAL | Status: AC
  Administered 2012-06-21 (×2): 20 meq via ORAL
  Filled 2012-06-21 (×2): qty 1

## 2012-06-21 MED ORDER — ONDANSETRON HCL 4 MG/2ML IJ SOLN: 4.0000 mg | Freq: Four times a day (QID) | INTRAMUSCULAR | Status: DC | PRN

## 2012-06-21 MED ORDER — DOPAMINE-DEXTROSE 3.2-5 MG/ML-% IV SOLN
3.0000 ug/kg/min | INTRAVENOUS | Status: DC | PRN
Start: 2012-06-21 — End: 2012-06-22
  Filled 2012-06-21: qty 250

## 2012-06-21 MED ORDER — DOCUSATE SODIUM 100 MG PO CAPS
100.0000 mg | ORAL_CAPSULE | Freq: Every day | ORAL | Status: DC
  Administered 2012-06-22: 100 mg via ORAL
  Filled 2012-06-21: qty 1

## 2012-06-21 MED ORDER — BISACODYL 10 MG RE SUPP: 10.0000 mg | Freq: Every day | RECTAL | Status: DC | PRN

## 2012-06-21 MED ORDER — LABETALOL HCL 5 MG/ML IV SOLN
10.0000 mg | INTRAVENOUS | Status: DC | PRN
  Filled 2012-06-21: qty 4

## 2012-06-21 MED ORDER — LISINOPRIL 10 MG PO TABS
10.0000 mg | ORAL_TABLET | Freq: Every day | ORAL | Status: DC
  Administered 2012-06-22: 10 mg via ORAL
  Filled 2012-06-21: qty 1

## 2012-06-21 MED ORDER — MORPHINE SULFATE 2 MG/ML IJ SOLN
2.0000 mg | INTRAMUSCULAR | Status: DC | PRN
  Filled 2012-06-21: qty 1

## 2012-06-21 MED ORDER — PANTOPRAZOLE SODIUM 40 MG PO TBEC
40.0000 mg | DELAYED_RELEASE_TABLET | Freq: Every day | ORAL | Status: DC
  Administered 2012-06-22: 40 mg via ORAL
  Filled 2012-06-21: qty 1

## 2012-06-21 MED ORDER — ALUM & MAG HYDROXIDE-SIMETH 200-200-20 MG/5ML PO SUSP: 15.0000 mL | ORAL | Status: DC | PRN

## 2012-06-21 MED ORDER — MOXIFLOXACIN HCL 400 MG PO TABS
400.0000 mg | ORAL_TABLET | Freq: Every day | ORAL | Status: DC
  Administered 2012-06-21: 400 mg via ORAL
  Filled 2012-06-21 (×2): qty 1

## 2012-06-21 MED ORDER — DIPHENHYDRAMINE HCL 50 MG/ML IJ SOLN: 12.5000 mg | Freq: Four times a day (QID) | INTRAMUSCULAR | Status: DC | PRN

## 2012-06-21 MED ORDER — SENNOSIDES-DOCUSATE SODIUM 8.6-50 MG PO TABS
1.0000 | ORAL_TABLET | Freq: Every evening | ORAL | Status: DC | PRN
  Filled 2012-06-21: qty 1

## 2012-06-21 MED ORDER — ASPIRIN 81 MG PO CHEW
81.0000 mg | CHEWABLE_TABLET | Freq: Every day | ORAL | Status: DC
  Administered 2012-06-22: 81 mg via ORAL
  Filled 2012-06-21: qty 1

## 2012-06-21 MED ORDER — HYDRALAZINE HCL 20 MG/ML IJ SOLN
10.0000 mg | INTRAMUSCULAR | Status: DC | PRN
  Filled 2012-06-21: qty 0.5

## 2012-06-21 MED ORDER — POTASSIUM CHLORIDE CRYS ER 20 MEQ PO TBCR: 20.0000 meq | EXTENDED_RELEASE_TABLET | Freq: Every day | ORAL | Status: DC | PRN

## 2012-06-21 MED ORDER — ACETAMINOPHEN 650 MG RE SUPP: 325.0000 mg | RECTAL | Status: DC | PRN

## 2012-06-21 MED ORDER — METOPROLOL TARTRATE 1 MG/ML IV SOLN: 2.0000 mg | INTRAVENOUS | Status: DC | PRN

## 2012-06-21 MED ORDER — GUAIFENESIN-DM 100-10 MG/5ML PO SYRP: 15.0000 mL | ORAL_SOLUTION | ORAL | Status: DC | PRN

## 2012-06-21 MED ORDER — ACETAMINOPHEN 325 MG PO TABS: 325.0000 mg | ORAL_TABLET | ORAL | Status: DC | PRN

## 2012-06-21 MED ORDER — OXYCODONE-ACETAMINOPHEN 5-325 MG PO TABS: 1.0000 | ORAL_TABLET | ORAL | Status: DC | PRN

## 2012-06-21 MED ORDER — DIPHENHYDRAMINE HCL 12.5 MG/5ML PO ELIX
12.5000 mg | ORAL_SOLUTION | Freq: Four times a day (QID) | ORAL | Status: DC | PRN
  Filled 2012-06-21: qty 5

## 2012-06-21 MED ORDER — SIMVASTATIN 20 MG PO TABS
20.0000 mg | ORAL_TABLET | Freq: Every day | ORAL | Status: DC
  Administered 2012-06-21: 20 mg via ORAL
  Filled 2012-06-21 (×2): qty 1

## 2012-06-21 MED ORDER — NICOTINE 21 MG/24HR TD PT24
21.0000 mg | MEDICATED_PATCH | Freq: Every day | TRANSDERMAL | Status: DC
  Administered 2012-06-22: 21 mg via TRANSDERMAL
  Filled 2012-06-21: qty 1

## 2012-06-21 MED ORDER — OMEGA-3-ACID ETHYL ESTERS 1 G PO CAPS
1.0000 g | ORAL_CAPSULE | Freq: Two times a day (BID) | ORAL | Status: DC
  Administered 2012-06-21 – 2012-06-22 (×2): 1 g via ORAL
  Filled 2012-06-21 (×3): qty 1

## 2012-06-21 NOTE — Progress Notes (Signed)
I will begin insurance approval for an inpt rehab admission. I will follow up in the morning. [phone]

## 2012-06-21 NOTE — PMR Pre-admission (Signed)
PMR Admission Coordinator Pre-Admission Assessment  Patient: Stephen Dean is an 78 y.o., male MRN: [id number] DOB: March 10, 1929 Height: 5\' 11"  (180.3 cm) Weight: 81.9 kg (180 lb 8.9 oz)  Insurance Information HMO:     PPO:      PCP:      IPA:      80/20: yes     OTHER: no HMO PRIMARY: Medicare a and b      Policy#: [id number]      Subscriber: pt Benefits:  Phone #: vision share     Name: 06/22/12 Eff. Date: 06/25/96     Deduct: $1184      Out of Pocket Max: none      Life Max: none CIR: 100%      SNF: 20 full days LBD 2/31/99 Outpatient: 80%     Co-Pay: 20% Home Health: 100%      Co-Pay: none DME: 80%     Co-Pay: 20% Providers: pt choice  SECONDARY: [employer]       Policy#: [id number]      Subscriber: pt  THIRD: [employer] Policy: [id number] subscriber: pt's wife   Emergency Contact Information Contact Information    Name Relation Home Work Mobile   [name] Spouse [phone]       Current Medical History  Patient Admitting Diagnosis: Right AKA  History of Present Illness:Stephen Dean is a 47 y.o. right-handed male admitted 06/17/2012 with ischemic right leg and increasing rest pain. Patient had been very independent and active prior to admission. Patient had initially attempted to treat this with rest and over-the-counter medications. Noted CPKs greater than 19,000 with findings of acute right foot drop. 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 Patient underwent thrombectomy as well as right common femoral artery to below-the-knee popliteal artery bypass 06/17/2012 per Dr. Imogene Burn. Close monitoring of extremity with ongoing ischemic changes and limb felt to be nonsalvageable and underwent right above-knee amputation 06/18/2012. Postoperative pain management. Patient placed on a NicoDerm patch for history of tobacco abuse. A chest x-ray completed showing consolidation right lower lobe possible pneumonia and  placed on Avelox therapy.  Past Medical History  History reviewed. No pertinent past medical history.  Family History  family history is not on file.  Prior Rehab/Hospitalizations:none   Current Medications  Current facility-administered medications:acetaminophen (TYLENOL) suppository 325-650 mg, 325-650 mg, Rectal, Q4H PRN, Lars Mage, PA;  acetaminophen (TYLENOL) tablet 325-650 mg, 325-650 mg, Oral, Q4H PRN, Lars Mage, PA;  alum & mag hydroxide-simeth (MAALOX/MYLANTA) 200-200-20 MG/5ML suspension 15-30 mL, 15-30 mL, Oral, Q2H PRN, Lars Mage, PA aspirin chewable tablet 81 mg, 81 mg, Oral, Daily, Lars Mage, PA, 81 mg at 06/22/12 0942;  atenolol (TENORMIN) tablet 50 mg, 50 mg, Oral, Daily, Lars Mage, PA, 50 mg at 06/22/12 0940;  bisacodyl (DULCOLAX) suppository 10 mg, 10 mg, Rectal, Daily PRN, Lars Mage, PA;  diphenhydrAMINE (BENADRYL) 12.5 MG/5ML elixir 12.5 mg, 12.5 mg, Oral, Q6H PRN, Lars Mage, PA diphenhydrAMINE (BENADRYL) injection 12.5 mg, 12.5 mg, Intravenous, Q6H PRN, Lars Mage, PA;  docusate sodium (COLACE) capsule 100 mg, 100 mg, Oral, Daily, Lars Mage, PA, 100 mg at 06/22/12 0940;  DOPamine (INTROPIN) 800 mg in dextrose 5 % 250 mL infusion, 3-5 mcg/kg/min, Intravenous, Continuous PRN, Lars Mage, PA;  guaiFENesin-dextromethorphan (ROBITUSSIN DM) 100-10 MG/5ML syrup 15 mL, 15 mL, Oral, Q4H PRN, Lars Mage, PA hydrALAZINE (APRESOLINE) injection 10 mg,  10 mg, Intravenous, Q2H PRN, Lars Mage, PA;  labetalol (NORMODYNE,TRANDATE) injection 10 mg, 10 mg, Intravenous, Q2H PRN, Lars Mage, PA;  lisinopril (PRINIVIL,ZESTRIL) tablet 10 mg, 10 mg, Oral, Daily, Lars Mage, PA, 10 mg at 06/22/12 0940;  metoprolol (LOPRESSOR) injection 2-5 mg, 2-5 mg, Intravenous, Q2H PRN, Lars Mage, PA morphine 2 MG/ML injection 2 mg, 2 mg, Intravenous, Q2H PRN, Lars Mage, PA;  moxifloxacin (AVELOX) tablet 400 mg, 400 mg, Oral, q1800, Lars Mage, PA, 400 mg at 06/21/12 1726;  nicotine (NICODERM CQ - dosed in mg/24 hours) patch 21 mg, 21 mg, Transdermal, Daily, Lars Mage, PA, 21 mg at 06/22/12 0940;  omega-3 acid ethyl esters (LOVAZA) capsule 1 g, 1 g, Oral, BID, Lars Mage, PA, 1 g at 06/22/12 0940 ondansetron (ZOFRAN) injection 4 mg, 4 mg, Intravenous, Q6H PRN, Lars Mage, PA;  oxyCODONE-acetaminophen (PERCOCET/ROXICET) 5-325 MG per tablet 1-2 tablet, 1-2 tablet, Oral, Q4H PRN, Lars Mage, PA;  pantoprazole (PROTONIX) EC tablet 40 mg, 40 mg, Oral, Q1200, Lars Mage, PA;  phenol (CHLORASEPTIC) mouth spray 1 spray, 1 spray, Mouth/Throat, PRN, Lars Mage, PA polyethylene glycol (MIRALAX / GLYCOLAX) packet 17 g, 17 g, Oral, Daily PRN, Lars Mage, PA;  potassium chloride SA (K-DUR,KLOR-CON) CR tablet 20 mEq, 20 mEq, Oral, BID, Lars Mage, PA, 20 mEq at 06/21/12 2117;  potassium chloride SA (K-DUR,KLOR-CON) CR tablet 20-40 mEq, 20-40 mEq, Oral, Daily PRN, Lars Mage, PA;  senna-docusate (Senokot-S) tablet 1 tablet, 1 tablet, Oral, QHS PRN, Lars Mage, PA simvastatin (ZOCOR) tablet 20 mg, 20 mg, Oral, q1800, Lars Mage, PA, 20 mg at 06/21/12 1726  Patients Current Diet: Cardiac  Precautions / Restrictions Precautions Precautions: Fall Restrictions Weight Bearing Restrictions: No   Prior Activity Level Active independent  Home Assistive Devices / Equipment Home Assistive Devices/Equipment: None Home Adaptive Equipment: Walker - rolling  Prior Functional Level Prior Function Level of Independence: Independent Able to Take Stairs?: Yes Driving: Yes Vocation: Retired  Current Functional Level Cognition  Arousal/Alertness: Awake/alert Overall Cognitive Status: Appears within functional limits for tasks assessed/performed Orientation Level: Oriented X4    Extremity Assessment (includes Sensation/Coordination)  RUE ROM/Strength/Tone: WFL for tasks assessed RUE Sensation: WFL - Light  Touch;WFL - Proprioception RUE Coordination: WFL - gross/fine motor  RLE ROM/Strength/Tone: Deficits (AKA) RLE ROM/Strength/Tone Deficits: discussed proper positioning of residual limb as well as preparation for prosthesis    ADLs  Grooming: Set up Where Assessed - Grooming: Supported sitting Upper Body Bathing: Set up Where Assessed - Upper Body Bathing: Unsupported sitting Lower Body Bathing: Maximal assistance Where Assessed - Lower Body Bathing: Supported sit to stand Upper Body Dressing: Set up Where Assessed - Upper Body Dressing: Unsupported sitting Lower Body Dressing: Maximal assistance Where Assessed - Lower Body Dressing: Supported sit to Pharmacist, hospital: Maximal assistance Toilet Transfer Method: Sit to stand;Stand pivot Acupuncturist: Other (comment) (bed - chair) Toileting - Clothing Manipulation and Hygiene: Maximal assistance Where Assessed - Toileting Clothing Manipulation and Hygiene: Sit to stand from 3-in-1 or toilet;Lean right and/or left Equipment Used: Rolling walker;Gait belt Transfers/Ambulation Related to ADLs: sit - stand mod A. Stand pivot Max A ADL Comments: Limited LB ADL. May benefit from AE    Mobility  Bed Mobility: Supine to Sit;Sitting - Scoot to Edge of Bed Rolling Left: 7: Independent Rolling Left: Patient Percentage: 50% Left Sidelying to Sit: 7: Independent Left Sidelying to Sit: Patient  Percentage: 50% Supine to Sit: 5: Supervision;HOB elevated;With rails Sitting - Scoot to Edge of Bed: 7: Independent Sitting - Scoot to [place] of Bed: Patient Percentage: 60%    Transfers  Transfers: Sit to Stand;Stand to Sit Sit to Stand: 3: Mod assist;From bed;With upper extremity assist Sit to Stand: Patient Percentage: 50% Stand to Sit: 4: Min assist;To chair/3-in-1;With upper extremity assist Stand to Sit: Patient Percentage: 60% Stand Pivot Transfers: 1: +2 Total assist Stand Pivot Transfers: Patient Percentage: 60%    Ambulation  / Gait / Stairs / Wheelchair Mobility  Ambulation/Gait Ambulation/Gait Assistance: 4: Min Environmental consultant (Feet): 10 Feet Assistive device: Rolling walker Ambulation/Gait Assistance Details: pt inquired about crutches but at this point, recommend RW for safety. vc's for not rushing, pt occasionally hops too far and throws balance bkwds, min A to correct. Gait Pattern:  (unilateral LE pattern) Gait velocity: decreased Stairs: No Wheelchair Mobility Wheelchair Mobility: No    Posture / Balance Static Sitting Balance Static Sitting - Balance Support: No upper extremity supported;Feet supported (L foot supported. S) Static Sitting - Level of Assistance: 5: Stand by assistance Dynamic Standing Balance Dynamic Standing - Balance Support: Bilateral upper extremity supported;During functional activity Dynamic Standing - Level of Assistance: 4: Min assist     Previous Home Environment Living Arrangements: Spouse/significant other Lives With: Spouse Available Help at Discharge: Family;Available 24 hours/day Type of Home: House Home Layout: One level Home Access: Stairs to enter [employer] of Steps: 1 Bathroom Shower/Tub: Forensic scientist: Standard Home Care Services: No  Discharge Living Setting Plans for Discharge Living Setting: Patient's home;Lives with (comment) (spouse) Type of Home at Discharge: House Discharge Home Layout: One level Discharge Home Access: Stairs to enter Entrance Stairs-Number of Steps: 1 step Discharge Bathroom Shower/Tub: Tub/shower unit Discharge Bathroom Toilet: Standard Do you have any problems obtaining your medications?: No  Social/Family/Support Systems Patient Roles: Spouse;Parent Contact Information: wife as above Anticipated Caregiver: wife Anticipated Industrial/product designer Information: as above Ability/Limitations of Caregiver: supervision Caregiver Availability: 24/7 Discharge Plan Discussed with  Primary Caregiver: Yes Is Caregiver In Agreement with Plan?: Yes Does Caregiver/Family have Issues with Lodging/Transportation while Pt is in Rehab?: No  Goals/Additional Needs Patient/Family Goal for Rehab: Mod I w/c level for PT and OT Expected length of stay: ELOS 7 to 10 days Pt/Family Agrees to Admission and willing to participate: Yes Program Orientation Provided & Reviewed with Pt/Caregiver Including Roles  & Responsibilities: Yes  Patient Condition: This patient's condition remains as documented in the Consult dated 06/21/12, in which the Rehabilitation Physician determined and documented that the patient's condition is appropriate for intensive rehabilitative care in an inpatient rehabilitation facility.  Preadmission Screen Completed By:  Clois Dupes, 06/22/2012 10:34 AM ______________________________________________________________________   Discussed status with Dr. Riley Kill on 06/22/12 at  1034 and received telephone approval for admission today.  Admission Coordinator:  Clois Dupes, time 1610 Date 06/22/12.

## 2012-06-21 NOTE — Telephone Encounter (Addendum)
Message copied by Rosalyn Charters on Mon Jun 21, 2012  2:06 PM ------      Message from: Phillips Odor      Created: Mon Jun 21, 2012 11:55 AM      Regarding: f/u in 4 wks/ BLC                   ----- Message -----         From: Lars Mage, PA         Sent: 06/21/2012   9:21 AM           To: Melene Plan, RN            F/U with Dr. Imogene Burn in 4 weeks s/p AKA right he is going to CIR. Thanks  l/v/m notifying pt of fu appt. with blc on 07-30-12 1:45 pm and mailed letter

## 2012-06-21 NOTE — Consult Note (Signed)
Physical Medicine and Rehabilitation Consult Reason for Consult: Right AKA Referring Physician: Dr. Imogene Burn   HPI: Stephen Dean is a 78 y.o. right-handed male admitted 06/17/2012 with ischemic right leg and increasing rest pain. Patient had been very independent and active prior to admission. Patient had initially attempted to treat this with rest and over-the-counter medications. Noted CPKs greater than 19,000 with findings of acute right foot drop. 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 Patient underwent thrombectomy as well as right common femoral artery to below-the-knee popliteal artery bypass 06/17/2012 per Dr. Imogene Burn. Close monitoring of extremity with ongoing ischemic changes and limb felt to be nonsalvageable and underwent right above-knee amputation 06/18/2012. Postoperative pain management. Patient placed on a NicoDerm patch for history of tobacco abuse. A chest x-ray completed showing consolidation right lower lobe possible pneumonia and placed on Avelox therapy. Physical and occupational therapy evaluations completed an ongoing with recommendations of physical medicine rehabilitation consult consider inpatient rehabilitation services   Review of Systems  Respiratory: Positive for cough.   Cardiovascular: Positive for leg swelling.  Gastrointestinal: Positive for constipation.  All other systems reviewed and are negative.   History reviewed. No pertinent past medical history. Past Surgical History  Procedure Date  . Hernia repair   . Cardiac surgery     Stent placement x 2   History reviewed. No pertinent family history. Social History:  reports that he has been smoking Cigarettes.  He has been smoking about 1 pack per day. He does not have any smokeless tobacco history on file. He reports that he does not drink alcohol or use illicit drugs. Allergies: No Known Allergies Medications Prior to Admission  Medication  Sig Dispense Refill  . acetaminophen (TYLENOL) 325 MG tablet Take 650 mg by mouth every 6 (six) hours as needed.      Marland Kitchen aspirin 81 MG chewable tablet Chew 81 mg by mouth daily.      Marland Kitchen atenolol (TENORMIN) 50 MG tablet Take 50 mg by mouth daily.      . fosinopril (MONOPRIL) 10 MG tablet Take 10 mg by mouth daily.      . Multiple Vitamins-Minerals (CENTURY SENIOR PO) Take 1 tablet by mouth daily.      Marland Kitchen omega-3 acid ethyl esters (LOVAZA) 1 G capsule Take 1 g by mouth 2 (two) times daily.      . pravastatin (PRAVACHOL) 40 MG tablet Take 80 mg by mouth daily.      . vitamin C (ASCORBIC ACID) 500 MG tablet Take 250 mg by mouth daily.      . vitamin E 400 UNIT capsule Take 400 Units by mouth daily.        Home: Home Living Lives With: Spouse Available Help at Discharge: Family;Available 24 hours/day Type of Home: House Home Access: Stairs to enter [employer] of Steps: 1 Home Layout: One level Bathroom Shower/Tub: Tub/shower unit;Curtain Firefighter: Standard Home Adaptive Equipment: Walker - rolling  Functional History: Prior Function Able to Take Stairs?: Yes Driving: Yes Vocation: Retired Functional Status:  Mobility: Bed Mobility Bed Mobility: Supine to Sit;Sitting - Scoot to Edge of Bed Rolling Left: 5: Supervision;With rail Rolling Left: Patient Percentage: 50% Left Sidelying to Sit: 1: +2 Total assist;HOB flat;With rails Left Sidelying to Sit: Patient Percentage: 50% Supine to Sit: 5: Supervision;HOB elevated;With rails Sitting - Scoot to Edge of Bed: 5: Supervision;With rail Sitting - Scoot to Edge of Bed: Patient Percentage: 60% Transfers Transfers:  Sit to Stand;Stand to Sit;Stand Pivot Transfers Sit to Stand: With upper extremity assist;From bed;3: Mod assist Sit to Stand: Patient Percentage: 50% Stand to Sit: 2: Max assist;With upper extremity assist;To chair/3-in-1 Stand to Sit: Patient Percentage: 60% Stand Pivot Transfers: 1: +2 Total assist Stand  Pivot Transfers: Patient Percentage: 60% Ambulation/Gait Ambulation/Gait Assistance: Not tested (comment) Assistive device: Rolling walker Stairs: No Wheelchair Mobility Wheelchair Mobility: No  ADL: ADL Grooming: Set up Where Assessed - Grooming: Supported sitting Upper Body Bathing: Set up Where Assessed - Upper Body Bathing: Unsupported sitting Lower Body Bathing: Maximal assistance Where Assessed - Lower Body Bathing: Supported sit to stand Upper Body Dressing: Set up Where Assessed - Upper Body Dressing: Unsupported sitting Lower Body Dressing: Maximal assistance Where Assessed - Lower Body Dressing: Supported sit to Pharmacist, hospital: Maximal assistance Toilet Transfer Method: Sit to stand;Stand pivot Acupuncturist: Other (comment) (bed - chair) Equipment Used: Rolling walker;Gait belt Transfers/Ambulation Related to ADLs: sit - stand mod A. Stand pivot Max A ADL Comments: Limited LB ADL. May benefit from AE  Cognition: Cognition Arousal/Alertness: Awake/alert Orientation Level: Oriented X4 Cognition Overall Cognitive Status: Appears within functional limits for tasks assessed/performed Arousal/Alertness: Awake/alert Orientation Level: Appears intact for tasks assessed Behavior During Session: Anxious  Blood pressure 127/58, pulse 85, temperature 98.1 F (36.7 C), temperature source Oral, resp. rate 20, height 5\' 11"  (1.803 m), weight 81.9 kg (180 lb 8.9 oz), SpO2 98.00%. Physical Exam  Constitutional: He is oriented to person, place, and time. He appears well-developed.  HENT:  Head: Normocephalic.  Eyes:       Pupils round and reactive to light  Neck: Neck supple. No thyromegaly present.  Cardiovascular: Normal rate and regular rhythm.   Pulmonary/Chest: Effort normal and breath sounds normal. He has no wheezes.  Abdominal: Bowel sounds are normal. There is no tenderness.  Neurological: He is alert and oriented to person, place, and time.        Follows full commands. Strength 3-4/5 except for right AK.  Sensory exam grossly intact  Skin:       Right AKA site is dressed with dry dressing  Psychiatric: He has a normal mood and affect.    No results found for this or any previous visit (from the past 24 hour(s)). Dg Chest Port 1 View  06/19/2012  *RADIOLOGY REPORT*  Clinical Data: 78 year old male with shortness of breath, desaturation.  PORTABLE CHEST - 1 VIEW  Comparison: 06/17/2012.  Findings: Portable semi upright AP view at 1648 hours.  New right lung base consolidation.  No pneumothorax or edema.  Stable cardiac size and mediastinal contours.  Visualized tracheal air column is within normal limits.  IMPRESSION: New right lower lobe consolidation, could reflect pneumonia and/or aspiration in this setting.   Original Report Authenticated By: Harley Hallmark, M.D.     Assessment/Plan: Diagnosis: Right AKA 1. Does the need for close, 24 hr/day medical supervision in concert with the patient's rehab needs make it unreasonable for this patient to be served in a less intensive setting? Yes 2. Co-Morbidities requiring supervision/potential complications: see above 3. Due to bladder management, bowel management, safety, skin/wound care, disease management, medication administration, pain management and patient education, does the patient require 24 hr/day rehab nursing? Yes 4. Does the patient require coordinated care of a physician, rehab nurse, PT (1-2 hrs/day, 5 days/week) and OT (1-2 hrs/day, 5 days/week) to address physical and functional deficits in the context of the above medical diagnosis(es)? Yes Addressing deficits  in the following areas: balance, endurance, locomotion, strength, transferring, bowel/bladder control, bathing, dressing, feeding, grooming and toileting 5. Can the patient actively participate in an intensive therapy program of at least 3 hrs of therapy per day at least 5 days per week? Yes 6. The potential for patient  to make measurable gains while on inpatient rehab is excellent 7. Anticipated functional outcomes upon discharge from inpatient rehab are wc mod I with PT, wc Mod I to supervision with OT, n/a with SLP. 8. Estimated rehab length of stay to reach the above functional goals is: 7-10 days 9. Does the patient have adequate social supports to accommodate these discharge functional goals? Yes 10. Anticipated D/C setting: Home 11. Anticipated post D/C treatments: HH therapy 12. Overall Rehab/Functional Prognosis: excellent  RECOMMENDATIONS: This patient's condition is appropriate for continued rehabilitative care in the following setting: CIR Patient has agreed to participate in recommended program. Yes Note that insurance prior authorization may be required for reimbursement for recommended care.  Comment:Rehab RN to follow up.   Ivory Broad, MD     06/21/2012

## 2012-06-21 NOTE — Progress Notes (Signed)
Orders were released too soon before pt was transferred and morning meds were discontinued. However, pt still received morning meds- protonix 40mg  , lisinipril 10mg , colace 100mg , aspirin 81mg , atenolol 50mg , nicotine patch on left shoulder, and omega 3Acid ethyl esters 1gm.

## 2012-06-21 NOTE — Progress Notes (Signed)
Orthopedic Tech Progress Note Patient Details:  JERRE Stephen Dean Sep 20, 1928 [id number]  Patient ID: AZEL BENEDETTI, male   DOB: 1929/08/04, 77 y.o.   MRN: [id number]   Shawnie Pons 06/21/2012, 2:36 PM RIGHT AKA RRETENTION SOCK COMPLETED BY BIO TECH

## 2012-06-21 NOTE — Progress Notes (Addendum)
[facility] [place]  Postoperative Visit - Amputation  Date of Surgery: 06/17/2012 - 06/18/2012 Procedure(s): AMPUTATION ABOVE KNEE Right Surgeon: Surgeon(s): Pryor Ochoa, MD POD: 3 Days Post-Op  Subjective Stephen Dean is a 78 y.o. male who is S/P Right Procedure(s): AMPUTATION ABOVE KNEE.  Pt.denies increased pain in the stump. The patient notes pain is well controlled. Pt. denies phantom pain.  Significant Diagnostic Studies: CBC Lab Results  Component Value Date   WBC 11.9* 06/20/2012   HGB 11.7* 06/20/2012   HCT 33.9* 06/20/2012   MCV 94.2 06/20/2012   PLT 109* 06/20/2012    BMET    Component Value Date/Time   NA 135 06/20/2012 0620   K 3.4* 06/20/2012 0620   CL 96 06/20/2012 0620   CO2 33* 06/20/2012 0620   GLUCOSE 100* 06/20/2012 0620   BUN 10 06/20/2012 0620   CREATININE 0.62 06/20/2012 0620   CALCIUM 8.3* 06/20/2012 0620   GFRNONAA >90 06/20/2012 0620   GFRAA >90 06/20/2012 0620    COAG No results found for this basename: INR, PROTIME   No results found for this basename: PTT     Intake/Output Summary (Last 24 hours) at 06/21/12 0719 Last data filed at 06/21/12 0507  Gross per 24 hour  Intake    120 ml  Output    850 ml  Net   -730 ml   No data found.    Physical Examination  BP Readings from Last 3 Encounters:  06/21/12 127/58  06/21/12 127/58  06/21/12 127/58   Temp Readings from Last 3 Encounters:  06/21/12 98.1 F (36.7 C) Oral  06/21/12 98.1 F (36.7 C) Oral  06/21/12 98.1 F (36.7 C) Oral   SpO2 Readings from Last 3 Encounters:  06/21/12 98%  06/21/12 98%  06/21/12 98%   Pulse Readings from Last 3 Encounters:  06/21/12 85  06/21/12 85  06/21/12 85    Pt is A&Ox3  WDWN male with no complaints  Right amputation wound is clean and healing well.  There is good bone coverage in the stump Stump is warm and well perfused, with min. drainage; without erythema   Assessment/plan:  Stephen Dean is a 78 y.o. male who is s/p Right Procedure(s): AMPUTATION ABOVE KNEE  The patient's stump is viable.  Follow-up 4 weeks from surgery  CIR verses home health.  The patient really wants to go home.  Clinton Gallant MAUREEN 7:19 AM 06/21/[phone]    Addendum  I have independently interviewed and examined the patient, and I agree with the physician assistant's findings.  I recommended CIR to the patient and it looks like he will be a good candidate for such.  His left popliteal artery also needs screening for a possible popliteal arterial aneurysm.  I am ordering that study today.  Leonides Sake, MD [facility] [place] Office: [phone] Pager: [phone]  06/21/2012, 7:43 AM

## 2012-06-21 NOTE — Progress Notes (Signed)
*  PRELIMINARY RESULTS* Vascular Ultrasound Limited Left lower extremity arterial duplex has been completed.  Preliminary findings: In evaluation of the left popliteal artery, there is no evidence of popliteal aneurysm. ]   Farrel Demark, RDMS, RVT  06/21/2012, 11:43 AM

## 2012-06-21 NOTE — Progress Notes (Signed)
Physical Therapy Treatment Patient Details Name: Stephen Dean MRN: [id number] DOB: 10/05/1928 Today's Date: 06/21/2012 Time: 0840-0908 PT Time Calculation (min): 28 min  PT Assessment / Plan / Recommendation Comments on Treatment Session  Pt has progressed well since initial evaluation, he is an excellent CIR candidate as he is motivated and strong but needs rehab for coordination, balance, safety, and improving functional mobility for d/c home.  PT will continue to follow.    Follow Up Recommendations  Post acute inpatient     Does the patient have the potential to tolerate intense rehabilitation  Yes, Recommend IP Rehab Screening  Barriers to Discharge        Equipment Recommendations  Rolling walker with 5" wheels;Tub/shower bench;Wheelchair (measurements)    Recommendations for Other Services    Frequency Min 3X/week   Plan Discharge plan remains appropriate;Frequency remains appropriate    Precautions / Restrictions Precautions Precautions: Fall Restrictions Weight Bearing Restrictions: No   Pertinent Vitals/Pain No c/o pain, O2 sats 93% on RA so O2 left off, notified nsg    Mobility  Bed Mobility Bed Mobility: Supine to Sit;Sitting - Scoot to Edge of Bed Rolling Left: 7: Independent Left Sidelying to Sit: 7: Independent Sitting - Scoot to Edge of Bed: 7: Independent Details for Bed Mobility Assistance: now independent with bed mobility Transfers Transfers: Sit to Stand;Stand to Sit Sit to Stand: 3: Mod assist;From bed;With upper extremity assist Stand to Sit: 4: Min assist;To chair/3-in-1;With upper extremity assist Details for Transfer Assistance: vc's to scoot to EOB before trying to stand, vc's for hand placement, pt needing assist to get wt centered over left leg to stand Ambulation/Gait Ambulation/Gait Assistance: 4: Min assist Ambulation Distance (Feet): 10 Feet Assistive device: Rolling walker Ambulation/Gait Assistance Details: pt inquired about  crutches but at this point, recommend RW for safety. vc's for not rushing, pt occasionally hops too far and throws balance bkwds, min A to correct. Gait Pattern:  (unilateral LE pattern) Gait velocity: decreased Stairs: No Wheelchair Mobility Wheelchair Mobility: No    Exercises General Exercises - Lower Extremity Ankle Circles/Pumps: AROM;10 reps;Seated;Left Quad Sets: AROM;10 reps;Left;Seated Gluteal Sets: AROM;Both;10 reps;Seated Amputee Exercises Hip Extension: Right;10 reps;Sidelying;AAROM Hip ABduction/ADduction: AROM;10 reps;Sidelying;Right;Limitations Hip Abduction/Adduction Limitations: pt needs min A to maintain SL   PT Diagnosis:    PT Problem List:   PT Treatment Interventions:     PT Goals Acute Rehab PT Goals PT Goal Formulation: With patient/family Time For Goal Achievement: 07/10/12 Potential to Achieve Goals: Good Pt will go Supine/Side to Sit: with supervision PT Goal: Supine/Side to Sit - Progress: Met Pt will go Sit to Supine/Side: Independently PT Goal: Sit to Supine/Side - Progress: Goal set today Pt will go Sit to Stand: with supervision PT Goal: Sit to Stand - Progress: Goal set today Pt will go Stand to Sit: with supervision PT Goal: Stand to Sit - Progress: Updated due to goals met Pt will Transfer Bed to Chair/Chair to Bed: with supervision PT Transfer Goal: Bed to Chair/Chair to Bed - Progress: Goal set today Pt will Ambulate: 1 - 15 feet;with supervision;with rolling walker PT Goal: Ambulate - Progress: Updated due to goal met  Visit Information  Last PT Received On: 06/21/12 Assistance Needed: +1    Subjective Data  Subjective: pt says he's ready to do whatever he needs to start getting better Patient Stated Goal: return home   Cognition  Overall Cognitive Status: Appears within functional limits for tasks assessed/performed Arousal/Alertness: Awake/alert Orientation Level: Appears intact for  tasks assessed Behavior During Session: [facility]  for tasks performed    Balance  Balance Balance Assessed: Yes Dynamic Standing Balance Dynamic Standing - Balance Support: Bilateral upper extremity supported;During functional activity Dynamic Standing - Level of Assistance: 4: Min assist  End of Session PT - End of Session Equipment Utilized During Treatment: Gait belt Activity Tolerance: Patient tolerated treatment well Patient left: in chair;with call bell/phone within reach Nurse Communication: Mobility status   GP   Lyanne Co, PT  Acute Rehab Services  [phone]   Lyanne Co 06/21/2012, 9:21 AM

## 2012-06-21 NOTE — Discharge Summary (Addendum)
[facility] Discharge Summary   Patient ID:  Stephen Dean MRN: [id number] DOB/AGE: 76-Aug-1930 76 y.o.  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 No pertinent past medical history.  Procedure(s): AMPUTATION ABOVE KNEE  Discharged Condition: good  HPI:  Stephen Dean is a 78 y.o. (05-26-1929) male who presents with chief complaint: right leg pain. Onset of symptom occurred 10 am yesterday. Pain is described as aching, severity 10/10, and associated with movement. Patient has attempted to treat this pain with rest and OTC medications. The patient has rest pain symptoms and anesthesia and motor loss. Atherosclerotic risk factors include: hyperlipidemia, smoking. He then underwent a R AKA in order to avoid any SIRS or renal failure related to myoglobin released due to extensive dead gastrocnemius in the R leg.  This was performed 06-18-2012.   Hospital Course:  Stephen Dean is a 78 y.o. male is S/P Right Procedure(s): AMPUTATION ABOVE KNEE Extubated: POD # 0 Post-op wounds clean, dry, intact or healing well Pt. Ambulating, voiding and taking PO diet without difficulty. Pt pain controlled with PO pain meds. Labs as below Complications:none  Consults:     Significant Diagnostic Studies: CBC Lab Results  Component Value Date   WBC 11.9* 06/20/2012   HGB 11.7* 06/20/2012   HCT 33.9* 06/20/2012   MCV 94.2 06/20/2012   PLT 109* 06/20/2012    BMET    Component Value  Date/Time   NA 135 06/20/2012 0620   K 3.4* 06/20/2012 0620   CL 96 06/20/2012 0620   CO2 33* 06/20/2012 0620   GLUCOSE 100* 06/20/2012 0620   BUN 10 06/20/2012 0620   CREATININE 0.62 06/20/2012 0620   CALCIUM 8.3* 06/20/2012 0620   GFRNONAA >90 06/20/2012 0620   GFRAA >90 06/20/2012 0620   COAG No results found for this basename: INR, PROTIME     Disposition:  Discharge to :Rehab   Stephen Dean, Stephen Dean  Home Medication Instructions [id number]   Printed on:06/21/12 0914  Medication Information                    atenolol (TENORMIN) 50 MG tablet Take 50 mg by mouth daily.           fosinopril (MONOPRIL) 10 MG tablet Take 10 mg by mouth daily.           pravastatin (PRAVACHOL) 40 MG tablet Take 80 mg by mouth daily.           vitamin E 400 UNIT capsule Take 400 Units by mouth daily.           aspirin 81 MG chewable tablet Chew 81 mg by mouth daily.           omega-3 acid ethyl esters (LOVAZA) 1 G capsule Take 1 g by mouth 2 (two) times daily.           vitamin C (ASCORBIC ACID) 500 MG tablet Take 250 mg by mouth daily.           acetaminophen (TYLENOL) 325 MG tablet Take 650 mg by mouth every 6 (six) hours as needed.  Multiple Vitamins-Minerals (CENTURY SENIOR PO) Take 1 tablet by mouth daily.            Verbal and written Discharge instructions given to the patient. Wound care per Discharge AVS F/U with Dr. Imogene Burn in 4 weeks for staple removal and wound check.  SignedMosetta Pigeon 06/21/2012, 9:14 AM    Addendum  I have independently interviewed and examined the patient, and I agree with the physician assistant's discharge summary.  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  He went to the operating room with a thrombectomy of the right anterior tibial artery, exclusion of popliteal aneurysm, and femoropopliteal bypass.  Unfortunately, his calf muscle had already died.  The next day the patient agreed to  proceed with R AKA.  His postoperative course otherwise has been unremarkable.  Patient demonstrates good rehab. potential is being placed in inpatient rehab to facilitate recover and eventual ambulation with a prosthetic leg.  He will follow up in the office in 4 weeks.  Leonides Sake, MD [facility] [place] Office: [phone] Pager: [phone]  06/21/2012, 1:06 PM  Discharge date updated.  Doreatha Massed 06/22/2012 7:50 AM

## 2012-06-22 ENCOUNTER — Encounter (HOSPITAL_COMMUNITY): Payer: Self-pay | Admitting: *Deleted

## 2012-06-22 ENCOUNTER — Inpatient Hospital Stay (HOSPITAL_COMMUNITY)
Admission: RE | Admit: 2012-06-22 | Discharge: 2012-06-29 | DRG: 945 | Disposition: A | Discharge status: Home Health | Payer: Medicare Other | Source: Ambulatory Visit | Attending: Physical Medicine & Rehabilitation | Admitting: Physical Medicine & Rehabilitation

## 2012-06-22 DIAGNOSIS — E785 Hyperlipidemia, unspecified: Secondary | ICD-10-CM

## 2012-06-22 DIAGNOSIS — Z7982 Long term (current) use of aspirin: Secondary | ICD-10-CM | POA: Diagnosis not present

## 2012-06-22 DIAGNOSIS — I2510 Atherosclerotic heart disease of native coronary artery without angina pectoris: Secondary | ICD-10-CM | POA: Diagnosis not present

## 2012-06-22 DIAGNOSIS — F17200 Nicotine dependence, unspecified, uncomplicated: Secondary | ICD-10-CM | POA: Diagnosis not present

## 2012-06-22 DIAGNOSIS — Z89619 Acquired absence of unspecified leg above knee: Secondary | ICD-10-CM

## 2012-06-22 DIAGNOSIS — I743 Embolism and thrombosis of arteries of the lower extremities: Secondary | ICD-10-CM | POA: Diagnosis not present

## 2012-06-22 DIAGNOSIS — S78119A Complete traumatic amputation at level between unspecified hip and knee, initial encounter: Secondary | ICD-10-CM

## 2012-06-22 DIAGNOSIS — E876 Hypokalemia: Secondary | ICD-10-CM | POA: Diagnosis not present

## 2012-06-22 DIAGNOSIS — I724 Aneurysm of artery of lower extremity: Secondary | ICD-10-CM

## 2012-06-22 DIAGNOSIS — Z9861 Coronary angioplasty status: Secondary | ICD-10-CM | POA: Diagnosis not present

## 2012-06-22 DIAGNOSIS — Z79899 Other long term (current) drug therapy: Secondary | ICD-10-CM

## 2012-06-22 DIAGNOSIS — I10 Essential (primary) hypertension: Secondary | ICD-10-CM

## 2012-06-22 DIAGNOSIS — Z5189 Encounter for other specified aftercare: Secondary | ICD-10-CM | POA: Diagnosis not present

## 2012-06-22 DIAGNOSIS — J189 Pneumonia, unspecified organism: Secondary | ICD-10-CM

## 2012-06-22 DIAGNOSIS — I70269 Atherosclerosis of native arteries of extremities with gangrene, unspecified extremity: Secondary | ICD-10-CM

## 2012-06-22 DIAGNOSIS — I739 Peripheral vascular disease, unspecified: Secondary | ICD-10-CM | POA: Diagnosis not present

## 2012-06-22 LAB — CULTURE, RESPIRATORY W GRAM STAIN

## 2012-06-22 MED ORDER — INFLUENZA VIRUS VACC SPLIT PF IM SUSP
0.5000 mL | INTRAMUSCULAR | Status: AC
  Administered 2012-06-23: 0.5 mL via INTRAMUSCULAR
  Filled 2012-06-22: qty 0.5

## 2012-06-22 MED ORDER — OMEGA-3-ACID ETHYL ESTERS 1 G PO CAPS
1.0000 g | ORAL_CAPSULE | Freq: Two times a day (BID) | ORAL | Status: DC
  Administered 2012-06-22 – 2012-06-29 (×14): 1 g via ORAL
  Filled 2012-06-22 (×16): qty 1

## 2012-06-22 MED ORDER — PANTOPRAZOLE SODIUM 40 MG PO TBEC
40.0000 mg | DELAYED_RELEASE_TABLET | Freq: Every day | ORAL | Status: DC
  Administered 2012-06-23 – 2012-06-29 (×7): 40 mg via ORAL
  Filled 2012-06-22 (×10): qty 1

## 2012-06-22 MED ORDER — SORBITOL 70 % SOLN
30.0000 mL | Freq: Every day | Status: DC | PRN
  Administered 2012-06-26: 30 mL via ORAL
  Filled 2012-06-22: qty 30

## 2012-06-22 MED ORDER — ONDANSETRON HCL 4 MG PO TABS: 4.0000 mg | ORAL_TABLET | Freq: Four times a day (QID) | ORAL | Status: DC | PRN

## 2012-06-22 MED ORDER — ALUM & MAG HYDROXIDE-SIMETH 200-200-20 MG/5ML PO SUSP: 15.0000 mL | ORAL | Status: DC | PRN

## 2012-06-22 MED ORDER — POLYETHYLENE GLYCOL 3350 17 G PO PACK
17.0000 g | PACK | Freq: Every day | ORAL | Status: DC | PRN
  Filled 2012-06-22: qty 1

## 2012-06-22 MED ORDER — BISACODYL 10 MG RE SUPP: 10.0000 mg | Freq: Every day | RECTAL | Status: DC | PRN

## 2012-06-22 MED ORDER — ASPIRIN 81 MG PO CHEW
81.0000 mg | CHEWABLE_TABLET | Freq: Every day | ORAL | Status: DC
  Administered 2012-06-23 – 2012-06-29 (×7): 81 mg via ORAL
  Filled 2012-06-22 (×8): qty 1

## 2012-06-22 MED ORDER — LISINOPRIL 10 MG PO TABS
10.0000 mg | ORAL_TABLET | Freq: Every day | ORAL | Status: DC
  Administered 2012-06-23 – 2012-06-29 (×7): 10 mg via ORAL
  Filled 2012-06-22 (×8): qty 1

## 2012-06-22 MED ORDER — GUAIFENESIN-DM 100-10 MG/5ML PO SYRP: 15.0000 mL | ORAL_SOLUTION | ORAL | Status: DC | PRN

## 2012-06-22 MED ORDER — MOXIFLOXACIN HCL 400 MG PO TABS
400.0000 mg | ORAL_TABLET | Freq: Every day | ORAL | Status: DC
  Administered 2012-06-22 – 2012-06-28 (×7): 400 mg via ORAL
  Filled 2012-06-22 (×8): qty 1

## 2012-06-22 MED ORDER — ONDANSETRON HCL 4 MG/2ML IJ SOLN: 4.0000 mg | Freq: Four times a day (QID) | INTRAMUSCULAR | Status: DC | PRN

## 2012-06-22 MED ORDER — ACETAMINOPHEN 325 MG PO TABS
325.0000 mg | ORAL_TABLET | ORAL | Status: DC | PRN
  Administered 2012-06-28 (×2): 650 mg via ORAL
  Filled 2012-06-22 (×3): qty 2

## 2012-06-22 MED ORDER — METOPROLOL TARTRATE 1 MG/ML IV SOLN
2.0000 mg | INTRAVENOUS | Status: DC | PRN
  Filled 2012-06-22: qty 5

## 2012-06-22 MED ORDER — SIMVASTATIN 20 MG PO TABS
20.0000 mg | ORAL_TABLET | Freq: Every day | ORAL | Status: DC
  Administered 2012-06-22 – 2012-06-28 (×7): 20 mg via ORAL
  Filled 2012-06-22 (×8): qty 1

## 2012-06-22 MED ORDER — PNEUMOCOCCAL VAC POLYVALENT 25 MCG/0.5ML IJ INJ
0.5000 mL | INJECTION | INTRAMUSCULAR | Status: AC
  Administered 2012-06-23: 0.5 mL via INTRAMUSCULAR
  Filled 2012-06-22: qty 0.5

## 2012-06-22 MED ORDER — ATENOLOL 50 MG PO TABS
50.0000 mg | ORAL_TABLET | Freq: Every day | ORAL | Status: DC
  Administered 2012-06-23 – 2012-06-29 (×7): 50 mg via ORAL
  Filled 2012-06-22 (×8): qty 1

## 2012-06-22 MED ORDER — PHENOL 1.4 % MT LIQD
1.0000 | OROMUCOSAL | Status: DC | PRN
  Filled 2012-06-22: qty 177

## 2012-06-22 MED ORDER — OXYCODONE-ACETAMINOPHEN 5-325 MG PO TABS
1.0000 | ORAL_TABLET | ORAL | Status: DC | PRN
Start: 2012-06-22 — End: 2012-06-29
  Administered 2012-06-24: 2 via ORAL
  Filled 2012-06-22: qty 2

## 2012-06-22 MED ORDER — NICOTINE 21 MG/24HR TD PT24
21.0000 mg | MEDICATED_PATCH | Freq: Every day | TRANSDERMAL | Status: DC
  Administered 2012-06-23 – 2012-06-27 (×5): 21 mg via TRANSDERMAL
  Filled 2012-06-22 (×8): qty 1

## 2012-06-22 NOTE — Progress Notes (Signed)
Occupational Therapy Treatment Patient Details Name: Stephen Dean MRN: [id number] DOB: Jul 26, 1929 Today's Date: 06/22/2012 Time: 1000-1031 OT Time Calculation (min): 31 min  OT Assessment / Plan / Recommendation Comments on Treatment Session Excellent progress. Very motivated to return to PLOF. Very appreciative of treatmnet session and seeing how independent he can be with self care. Pt taught desensitization techniques. Discussed typical rehab process s/p amputation, including importance of desensitizing residual limb and using shrinker to shape limb. Plan to D/C to CIR today.    Follow Up Recommendations  Inpatient Rehab    Barriers to Discharge   none    Equipment Recommendations  Rolling walker with 5" wheels;Tub/shower bench;Wheelchair (measurements)    Recommendations for Other Services Rehab consult  Frequency Min 2X/week   Plan Discharge plan remains appropriate    Precautions / Restrictions Precautions Precautions: Fall Restrictions Weight Bearing Restrictions: No   Pertinent Vitals/Pain No c/o    ADL  Grooming: Set up Where Assessed - Grooming: Unsupported sitting Upper Body Bathing: Set up Where Assessed - Upper Body Bathing: Unsupported sitting Lower Body Bathing: Minimal assistance Where Assessed - Lower Body Bathing: Supported sit to stand Upper Body Dressing: Set up Where Assessed - Upper Body Dressing: Unsupported sitting Lower Body Dressing: Minimal assistance Where Assessed - Lower Body Dressing: Supported sit to Pharmacist, hospital: Moderate assistance Toilet Transfer Method: Sit to stand;Stand pivot Equipment Used: Rolling walker;Gait belt Transfers/Ambulation Related to ADLs: Min A with 1 hand on walker and 1 hand on bed/chair ADL Comments: Making great progress with ADL. Fatigue is factor    OT Diagnosis:    OT Problem List:   OT Treatment Interventions:     OT Goals Acute Rehab OT Goals OT Goal Formulation: With patient Time For  Goal Achievement: 07/04/12 Potential to Achieve Goals: Good ADL Goals Pt Will Perform Lower Body Bathing: Supine, head of bed up;Supine, rolling right and/or left;Unsupported;with supervision ADL Goal: Lower Body Bathing - Progress: Met Pt Will Perform Lower Body Dressing: Supine, rolling right and/or left;with supervision;Unsupported;with cueing (comment type and amount) ADL Goal: Lower Body Dressing - Progress: Met Pt Will Transfer to Toilet: Drop arm 3-in-1;with DME;Squat pivot transfer;with min assist ADL Goal: Toilet Transfer - Progress: Progressing toward goals Pt Will Perform Toileting - Clothing Manipulation: with supervision;Sitting on 3-in-1 or toilet ADL Goal: Toileting - Clothing Manipulation - Progress: Progressing toward goals Pt Will Perform Toileting - Hygiene: with supervision;Sitting on 3-in-1 or toilet;Leaning right and/or left on 3-in-1/toilet ADL Goal: Toileting - Hygiene - Progress: Progressing toward goals Additional ADL Goal #1: complete dynamic ADL task sitting EOB with stand by assist for balance with reaching 1 foot out of BOS. ADL Goal: Additional Goal #1 - Progress: Met  Visit Information  Last OT Received On: 06/22/12 Assistance Needed: +1    Subjective Data      Prior Functioning       Cognition  Overall Cognitive Status: Appears within functional limits for tasks assessed/performed Arousal/Alertness: Awake/alert Orientation Level: Appears intact for tasks assessed Behavior During Session: [facility] for tasks performed    Mobility  Shoulder Instructions Bed Mobility Bed Mobility: Supine to Sit;Sitting - Scoot to Edge of Bed Rolling Left: 6: Modified independent (Device/Increase time) Left Sidelying to Sit: 6: Modified independent (Device/Increase time) Sitting - Scoot to Edge of Bed: 6: Modified independent (Device/Increase time) Transfers Transfers: Sit to Stand;Stand to Sit Sit to Stand: 4: Min assist;With upper extremity assist;From bed Stand to  Sit: 4: Min assist;With upper extremity assist;To chair/3-in-1  Details for Transfer Assistance: vc for hand placement       Exercises   general strengthening in standing   Balance  Min A standing   End of Session OT - End of Session Equipment Utilized During Treatment: Gait belt Activity Tolerance: Patient tolerated treatment well Patient left: in chair;with call bell/phone within reach Nurse Communication: Mobility status;Precautions  GO     Amarachukwu Lakatos,HILLARY 06/22/2012, 10:36 AM Luisa Dago, OTR/L  [phone] 06/22/2012

## 2012-06-22 NOTE — Progress Notes (Signed)
Patient has been transferred to 4140, report given to nurse and patient's IV and telemetry has been discontinued.  Lorretta Harp RN

## 2012-06-22 NOTE — H&P (Signed)
Physical Medicine and Rehabilitation Admission H&P    Chief Complaint  Patient presents with  . Right leg injury   : HPI: Stephen Dean is a 78 y.o. right-handed male admitted 06/17/2012 with ischemic right leg and increasing rest pain. Patient had been very independent and active prior to admission. Patient had initially attempted to treat this with rest and over-the-counter medications. Noted CPKs greater than 19,000 with findings of acute right foot drop. 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 Patient underwent thrombectomy as well as right common femoral artery to below-the-knee popliteal artery bypass 06/17/2012 per Dr. Chen. Close monitoring of extremity with ongoing ischemic changes and limb felt to be nonsalvageable and underwent right above-knee amputation 06/18/2012. Postoperative pain management. [employer] prosthetics was consulted for pre-prosthetic equipment needs. Patient placed on a NicoDerm patch for history of tobacco abuse. A chest x-ray completed showing consolidation right lower lobe possible pneumonia and placed on Avelox therapy. Physical and occupational therapy evaluations completed an ongoing with recommendations of physical medicine rehabilitation consult consider inpatient rehabilitation services. Patient was felt to be a candidate for inpatient rehabilitation services and was admitted for comprehensive rehabilitation program  Review of Systems  Respiratory: Positive for cough.  Cardiovascular: Positive for leg swelling.  Gastrointestinal: Positive for constipation.  All other systems reviewed and are negative   History reviewed. No pertinent past medical history. Past Surgical History  Procedure Date  . Hernia repair   . Cardiac surgery     Stent placement x 2  . Embolectomy 06/17/2012    Procedure: EMBOLECTOMY;  Surgeon: Brian L Chen, MD;  Location: [facility] OR;  Service: Vascular;  Laterality: Right;   Thrombectomy of right tibial arteries,exclusion of right popliteal aneurysm, femoral -popliteal artery bypass graft,four compartment fasciotomies  . Femoral-popliteal bypass graft 06/17/2012    Procedure: BYPASS GRAFT FEMORAL-POPLITEAL ARTERY;  Surgeon: Brian L Chen, MD;  Location: [facility] OR;  Service: Vascular;  Laterality: Right;  Using 6 mm x 80 cm propaten goretex graft  . Intraoperative arteriogram 06/17/2012    Procedure: INTRA OPERATIVE ARTERIOGRAM;  Surgeon: Brian L Chen, MD;  Location: [facility] OR;  Service: Vascular;  Laterality: Right;  . Fasciotomy 06/17/2012    Procedure: FASCIOTOMY;  Surgeon: Brian L Chen, MD;  Location: [facility] OR;  Service: Vascular;  Laterality: Right;  four compartment  fasciotomy  . Amputation 06/18/2012    Procedure: AMPUTATION ABOVE KNEE;  Surgeon: James D Lawson, MD;  Location: [facility] OR;  Service: Vascular;  Laterality: Right;   History reviewed. No pertinent family history. Social History:  reports that he has been smoking Cigarettes.  He has been smoking about 1 pack per day. He does not have any smokeless tobacco history on file. He reports that he does not drink alcohol or use illicit drugs. Allergies: No Known Allergies Medications Prior to Admission  Medication Sig Dispense Refill  . acetaminophen (TYLENOL) 325 MG tablet Take 650 mg by mouth every 6 (six) hours as needed.      . aspirin 81 MG chewable tablet Chew 81 mg by mouth daily.      . atenolol (TENORMIN) 50 MG tablet Take 50 mg by mouth daily.      . fosinopril (MONOPRIL) 10 MG tablet Take 10 mg by mouth daily.      . Multiple Vitamins-Minerals (CENTURY SENIOR PO) Take 1 tablet by mouth daily.      . omega-3 acid ethyl esters (LOVAZA) 1 G capsule Take 1   g by mouth 2 (two) times daily.      . pravastatin (PRAVACHOL) 40 MG tablet Take 80 mg by mouth daily.      . vitamin C (ASCORBIC ACID) 500 MG tablet Take 250 mg by mouth daily.      . vitamin E 400 UNIT capsule Take 400 Units by mouth daily.         Home: Home Living Lives With: Spouse Available Help at Discharge: Family;Available 24 hours/day Type of Home: House Home Access: Stairs to enter Entrance Stairs-Number of Steps: 1 Home Layout: One level Bathroom Shower/Tub: Tub/shower unit;Curtain Bathroom Toilet: Standard Home Adaptive Equipment: Walker - rolling   Functional History: Prior Function Able to Take Stairs?: Yes Driving: Yes Vocation: Retired  Functional Status:  Mobility: Bed Mobility Bed Mobility: Supine to Sit;Sitting - Scoot to Edge of Bed Rolling Left: 7: Independent Rolling Left: Patient Percentage: 50% Left Sidelying to Sit: 7: Independent Left Sidelying to Sit: Patient Percentage: 50% Supine to Sit: 5: Supervision;HOB elevated;With rails Sitting - Scoot to Edge of Bed: 7: Independent Sitting - Scoot to Edge of Bed: Patient Percentage: 60% Transfers Transfers: Sit to Stand;Stand to Sit Sit to Stand: 3: Mod assist;From bed;With upper extremity assist Sit to Stand: Patient Percentage: 50% Stand to Sit: 4: Min assist;To chair/3-in-1;With upper extremity assist Stand to Sit: Patient Percentage: 60% Stand Pivot Transfers: 1: +2 Total assist Stand Pivot Transfers: Patient Percentage: 60% Ambulation/Gait Ambulation/Gait Assistance: 4: Min assist Ambulation Distance (Feet): 10 Feet Assistive device: Rolling walker Ambulation/Gait Assistance Details: pt inquired about crutches but at this point, recommend RW for safety. vc's for not rushing, pt occasionally hops too far and throws balance bkwds, min A to correct. Gait Pattern:  (unilateral LE pattern) Gait velocity: decreased Stairs: No Wheelchair Mobility Wheelchair Mobility: No  ADL: ADL Grooming: Set up Where Assessed - Grooming: Supported sitting Upper Body Bathing: Set up Where Assessed - Upper Body Bathing: Unsupported sitting Lower Body Bathing: Maximal assistance Where Assessed - Lower Body Bathing: Supported sit to stand Upper Body  Dressing: Set up Where Assessed - Upper Body Dressing: Unsupported sitting Lower Body Dressing: Maximal assistance Where Assessed - Lower Body Dressing: Supported sit to stand Toilet Transfer: Maximal assistance Toilet Transfer Method: Sit to stand;Stand pivot Toilet Transfer Equipment: Other (comment) (bed - chair) Equipment Used: Rolling walker;Gait belt Transfers/Ambulation Related to ADLs: sit - stand mod A. Stand pivot Max A ADL Comments: Limited LB ADL. May benefit from AE  Cognition: Cognition Arousal/Alertness: Awake/alert Orientation Level: Oriented X4 Cognition Overall Cognitive Status: Appears within functional limits for tasks assessed/performed Arousal/Alertness: Awake/alert Orientation Level: Appears intact for tasks assessed Behavior During Session: WFL for tasks performed   Blood pressure 146/75, pulse 82, temperature 98.2 F (36.8 C), temperature source Oral, resp. rate 20, height 5' 11" (1.803 m), weight 81.9 kg (180 lb 8.9 oz), SpO2 93.00%. Physical Exam  Constitutional: He is oriented to person, place, and time. He appears well-developed.  HENT:  Head: Normocephalic.  Eyes:  Pupils round and reactive to light  Neck: Neck supple. No thyromegaly present.  Cardiovascular: Normal rate and regular rhythm. No murmur Pulmonary/Chest: Effort normal and breath sounds normal, perhaps decreased at the right base. He has no wheezes. No distress. Abdominal: Bowel sounds are normal. There is no tenderness.  Neurological: He is alert and oriented to person, place, and time. Cn exam normal. Follows full commands. Strength 4/5 except for right AK. Able to flex right hip somewhat agst gravity.  Sensory exam grossly   intact  Skin:  Right AKA site is dressed with dry dressing. Area with scant amount of serosanginuous dc. Leg is appropriately tender.  Psychiatric: He has a normal mood and affect   No results found for this or any previous visit (from the past 48 hour(s)). No  results found.  Post Admission Physician Evaluation: 1. Functional deficits secondary  to right AKA. 2. Patient is admitted to receive collaborative, interdisciplinary care between the physiatrist, rehab nursing staff, and therapy team. 3. Patient's level of medical complexity and substantial therapy needs in context of that medical necessity cannot be provided at a lesser intensity of care such as a SNF. 4. Patient has experienced substantial functional loss from his/her baseline which was documented above under the "Functional History" and "Functional Status" headings.  Judging by the patient's diagnosis, physical exam, and functional history, the patient has potential for functional progress which will result in measurable gains while on inpatient rehab.  These gains will be of substantial and practical use upon discharge  in facilitating mobility and self-care at the household level. 5. Physiatrist will provide 24 hour management of medical needs as well as oversight of the therapy plan/treatment and provide guidance as appropriate regarding the interaction of the two. 6. 24 hour rehab nursing will assist with bladder management, bowel management, safety, skin/wound care, disease management, medication administration, pain management and patient education  and help integrate therapy concepts, techniques,education, etc. 7. PT will assess and treat for:  fxnl mobility, pre-pros ed, pain mgt, adaptive equipment, contracture prevention.  Goals are: mod I to supervision. 8. OT will assess and treat for: UES, ROM, ADL's, fxnl mobility, safety, adaptive equipment.   Goals are: mod I to set up. 9. SLP will assess and treat for: n/a.  Goals are: n/a. 10. Case Management and Social Worker will assess and treat for psychological issues and discharge planning. 11. Team conference will be held weekly to assess progress toward goals and to determine barriers to discharge. 12. Patient will receive at least 3  hours of therapy per day at least 5 days per week. 13. ELOS: 10-12 days      Prognosis:  excellent   Medical Problem List and Plan: 1. 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 DVT Prophylaxis/Anticoagulation: SCDs left lower extremity. Monitor for any signs of DVT 3. Pain Management: Percocet as needed. Monitor with increased mobility 4. Neuropsych: This patient is capable of making decisions on his/her own behalf. 5. Hypertension. Tenormin 50 mg daily, lisinopril 10 mg daily. Monitor with increased mobility 6. Question right lower lobe possible pneumonia. Empiric Avelox initiated 06/21/2012. Oxygen saturations greater than 90% on room air. Patient denies any shortness of breath or sputum production. Will monitor and followup chest x-ray 7. Tobacco abuse. NicoDerm patch. Discuss cessation of nicotine products 8. Hyperlipidemia. Zocor/lovaza 9. CAD/PTCA. Continue aspirin therapy. Patient denies any chest pain 06/22/2012, Zach Swartz, MD 

## 2012-06-22 NOTE — Progress Notes (Signed)
Met with patient at bedside. We can admit pt to inpt rehab today and he is in agreement. [phone]

## 2012-06-22 NOTE — Progress Notes (Addendum)
06/22/2012 7:42 AM POD 4  Subjective:  With no complaints-states he is going to rehab this am.  Afebrile x 24 hrs Filed Vitals:   06/22/12 0626  BP: 146/75  Pulse: 82  Temp: 98.2 F (36.8 C)  Resp: 20    Physical Exam:  Extremities:  Right AKA stump is with retention sock in place.   CBC    Component Value Date/Time   WBC 11.9* 06/20/2012 0620   RBC 3.60* 06/20/2012 0620   HGB 11.7* 06/20/2012 0620   HCT 33.9* 06/20/2012 0620   PLT 109* 06/20/2012 0620   MCV 94.2 06/20/2012 0620   MCH 32.5 06/20/2012 0620   MCHC 34.5 06/20/2012 0620   RDW 13.0 06/20/2012 0620   LYMPHSABS 1.5 06/17/2012 0518   MONOABS 1.1* 06/17/2012 0518   EOSABS 0.0 06/17/2012 0518   BASOSABS 0.0 06/17/2012 0518    BMET    Component Value Date/Time   NA 135 06/20/2012 0620   K 3.4* 06/20/2012 0620   CL 96 06/20/2012 0620   CO2 33* 06/20/2012 0620   GLUCOSE 100* 06/20/2012 0620   BUN 10 06/20/2012 0620   CREATININE 0.62 06/20/2012 0620   CALCIUM 8.3* 06/20/2012 0620   GFRNONAA >90 06/20/2012 0620   GFRAA >90 06/20/2012 0620    INR No results found for this basename: inr     Intake/Output Summary (Last 24 hours) at 06/22/12 0742 Last data filed at 06/22/12 0735  Gross per 24 hour  Intake    600 ml  Output   1175 ml  Net   -575 ml     Assessment/Plan:  78 y.o. male is s/p right above knee amputation  POD 4  -doing well this am. -CIR awaiting insurance approval for transfer  Delorse Lek [facility] [phone] 06/22/2012 7:42 AM  Addendum  I have independently interviewed and examined the patient, and I agree with the physician assistant's findings.  L popliteal artery does NOT have an aneurysm on duplex evaluation.  Leonides Sake, MD [facility] [place] Office: [phone] Pager: [phone]  06/22/2012, 8:10 AM

## 2012-06-22 NOTE — H&P (View-Only) (Signed)
Physical Medicine and Rehabilitation Admission H&P    Chief Complaint  Patient presents with  . Right leg injury   : HPI: Stephen Dean is a 78 y.o. right-handed male admitted 06/17/2012 with ischemic right leg and increasing rest pain. Patient had been very independent and active prior to admission. Patient had initially attempted to treat this with rest and over-the-counter medications. Noted CPKs greater than 19,000 with findings of acute right foot drop. 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 Patient underwent thrombectomy as well as right common femoral artery to below-the-knee popliteal artery bypass 06/17/2012 per Dr. Imogene Burn. Close monitoring of extremity with ongoing ischemic changes and limb felt to be nonsalvageable and underwent right above-knee amputation 06/18/2012. Postoperative pain management. [employer] prosthetics was consulted for pre-prosthetic equipment needs. Patient placed on a NicoDerm patch for history of tobacco abuse. A chest x-ray completed showing consolidation right lower lobe possible pneumonia and placed on Avelox therapy. Physical and occupational therapy evaluations completed an ongoing with recommendations of physical medicine rehabilitation consult consider inpatient rehabilitation services. Patient was felt to be a candidate for inpatient rehabilitation services and was admitted for comprehensive rehabilitation program  Review of Systems  Respiratory: Positive for cough.  Cardiovascular: Positive for leg swelling.  Gastrointestinal: Positive for constipation.  All other systems reviewed and are negative   History reviewed. No pertinent past medical history. Past Surgical History  Procedure Date  . Hernia repair   . Cardiac surgery     Stent placement x 2  . Embolectomy 06/17/2012    Procedure: EMBOLECTOMY;  Surgeon: Fransisco Hertz, MD;  Location: [facility] OR;  Service: Vascular;  Laterality: Right;   Thrombectomy of right tibial arteries,exclusion of right popliteal aneurysm, femoral -popliteal artery bypass graft,four compartment fasciotomies  . Femoral-popliteal bypass graft 06/17/2012    Procedure: BYPASS GRAFT FEMORAL-POPLITEAL ARTERY;  Surgeon: Fransisco Hertz, MD;  Location: [facility] OR;  Service: Vascular;  Laterality: Right;  Using 6 mm x 80 cm propaten goretex graft  . Intraoperative arteriogram 06/17/2012    Procedure: INTRA OPERATIVE ARTERIOGRAM;  Surgeon: Fransisco Hertz, MD;  Location: [facility] OR;  Service: Vascular;  Laterality: Right;  . Fasciotomy 06/17/2012    Procedure: FASCIOTOMY;  Surgeon: Fransisco Hertz, MD;  Location: [facility] OR;  Service: Vascular;  Laterality: Right;  four compartment  fasciotomy  . Amputation 06/18/2012    Procedure: AMPUTATION ABOVE KNEE;  Surgeon: Pryor Ochoa, MD;  Location: [facility] OR;  Service: Vascular;  Laterality: Right;   History reviewed. No pertinent family history. Social History:  reports that he has been smoking Cigarettes.  He has been smoking about 1 pack per day. He does not have any smokeless tobacco history on file. He reports that he does not drink alcohol or use illicit drugs. Allergies: No Known Allergies Medications Prior to Admission  Medication Sig Dispense Refill  . acetaminophen (TYLENOL) 325 MG tablet Take 650 mg by mouth every 6 (six) hours as needed.      Marland Kitchen aspirin 81 MG chewable tablet Chew 81 mg by mouth daily.      Marland Kitchen atenolol (TENORMIN) 50 MG tablet Take 50 mg by mouth daily.      . fosinopril (MONOPRIL) 10 MG tablet Take 10 mg by mouth daily.      . Multiple Vitamins-Minerals (CENTURY SENIOR PO) Take 1 tablet by mouth daily.      Marland Kitchen omega-3 acid ethyl esters (LOVAZA) 1 G capsule Take 1  g by mouth 2 (two) times daily.      . pravastatin (PRAVACHOL) 40 MG tablet Take 80 mg by mouth daily.      . vitamin C (ASCORBIC ACID) 500 MG tablet Take 250 mg by mouth daily.      . vitamin E 400 UNIT capsule Take 400 Units by mouth daily.         Home: Home Living Lives With: Spouse Available Help at Discharge: Family;Available 24 hours/day Type of Home: House Home Access: Stairs to enter [employer] of Steps: 1 Home Layout: One level Bathroom Shower/Tub: Tub/shower unit;Curtain Firefighter: Standard Home Adaptive Equipment: Walker - rolling   Functional History: Prior Function Able to Take Stairs?: Yes Driving: Yes Vocation: Retired  Functional Status:  Mobility: Bed Mobility Bed Mobility: Supine to Sit;Sitting - Scoot to Edge of Bed Rolling Left: 7: Independent Rolling Left: Patient Percentage: 50% Left Sidelying to Sit: 7: Independent Left Sidelying to Sit: Patient Percentage: 50% Supine to Sit: 5: Supervision;HOB elevated;With rails Sitting - Scoot to Edge of Bed: 7: Independent Sitting - Scoot to [place] of Bed: Patient Percentage: 60% Transfers Transfers: Sit to Stand;Stand to Sit Sit to Stand: 3: Mod assist;From bed;With upper extremity assist Sit to Stand: Patient Percentage: 50% Stand to Sit: 4: Min assist;To chair/3-in-1;With upper extremity assist Stand to Sit: Patient Percentage: 60% Stand Pivot Transfers: 1: +2 Total assist Stand Pivot Transfers: Patient Percentage: 60% Ambulation/Gait Ambulation/Gait Assistance: 4: Min assist Ambulation Distance (Feet): 10 Feet Assistive device: Rolling walker Ambulation/Gait Assistance Details: pt inquired about crutches but at this point, recommend RW for safety. vc's for not rushing, pt occasionally hops too far and throws balance bkwds, min A to correct. Gait Pattern:  (unilateral LE pattern) Gait velocity: decreased Stairs: No Wheelchair Mobility Wheelchair Mobility: No  ADL: ADL Grooming: Set up Where Assessed - Grooming: Supported sitting Upper Body Bathing: Set up Where Assessed - Upper Body Bathing: Unsupported sitting Lower Body Bathing: Maximal assistance Where Assessed - Lower Body Bathing: Supported sit to stand Upper Body  Dressing: Set up Where Assessed - Upper Body Dressing: Unsupported sitting Lower Body Dressing: Maximal assistance Where Assessed - Lower Body Dressing: Supported sit to Pharmacist, hospital: Maximal assistance Toilet Transfer Method: Sit to stand;Stand pivot Acupuncturist: Other (comment) (bed - chair) Equipment Used: Rolling walker;Gait belt Transfers/Ambulation Related to ADLs: sit - stand mod A. Stand pivot Max A ADL Comments: Limited LB ADL. May benefit from AE  Cognition: Cognition Arousal/Alertness: Awake/alert Orientation Level: Oriented X4 Cognition Overall Cognitive Status: Appears within functional limits for tasks assessed/performed Arousal/Alertness: Awake/alert Orientation Level: Appears intact for tasks assessed Behavior During Session: [facility] for tasks performed   Blood pressure 146/75, pulse 82, temperature 98.2 F (36.8 C), temperature source Oral, resp. rate 20, height 5\' 11"  (1.803 m), weight 81.9 kg (180 lb 8.9 oz), SpO2 93.00%. Physical Exam  Constitutional: He is oriented to person, place, and time. He appears well-developed.  HENT:  Head: Normocephalic.  Eyes:  Pupils round and reactive to light  Neck: Neck supple. No thyromegaly present.  Cardiovascular: Normal rate and regular rhythm. No murmur Pulmonary/Chest: Effort normal and breath sounds normal, perhaps decreased at the right base. He has no wheezes. No distress. Abdominal: Bowel sounds are normal. There is no tenderness.  Neurological: He is alert and oriented to person, place, and time. Cn exam normal. Follows full commands. Strength 4/5 except for right AK. Able to flex right hip somewhat agst gravity.  Sensory exam grossly  intact  Skin:  Right AKA site is dressed with dry dressing. Area with scant amount of serosanginuous dc. Leg is appropriately tender.  Psychiatric: He has a normal mood and affect   No results found for this or any previous visit (from the past 48 hour(s)). No  results found.  Post Admission Physician Evaluation: 1. Functional deficits secondary  to right AKA. 2. Patient is admitted to receive collaborative, interdisciplinary care between the physiatrist, rehab nursing staff, and therapy team. 3. Patient's level of medical complexity and substantial therapy needs in context of that medical necessity cannot be provided at a lesser intensity of care such as a SNF. 4. Patient has experienced substantial functional loss from his/her baseline which was documented above under the "Functional History" and "Functional Status" headings.  Judging by the patient's diagnosis, physical exam, and functional history, the patient has potential for functional progress which will result in measurable gains while on inpatient rehab.  These gains will be of substantial and practical use upon discharge  in facilitating mobility and self-care at the household level. 5. Physiatrist will provide 24 hour management of medical needs as well as oversight of the therapy plan/treatment and provide guidance as appropriate regarding the interaction of the two. 6. 24 hour rehab nursing will assist with bladder management, bowel management, safety, skin/wound care, disease management, medication administration, pain management and patient education  and help integrate therapy concepts, techniques,education, etc. 7. PT will assess and treat for:  fxnl mobility, pre-pros ed, pain mgt, adaptive equipment, contracture prevention.  Goals are: mod I to supervision. 8. OT will assess and treat for: UES, ROM, ADL's, fxnl mobility, safety, adaptive equipment.   Goals are: mod I to set up. 9. SLP will assess and treat for: n/a.  Goals are: n/a. 10. Case Management and Social Worker will assess and treat for psychological issues and discharge planning. 11. Team conference will be held weekly to assess progress toward goals and to determine barriers to discharge. 12. Patient will receive at least 3  hours of therapy per day at least 5 days per week. 13. ELOS: 10-12 days      Prognosis:  excellent   Medical Problem List and Plan: 1. 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 DVT Prophylaxis/Anticoagulation: SCDs left lower extremity. Monitor for any signs of DVT 3. Pain Management: Percocet as needed. Monitor with increased mobility 4. Neuropsych: This patient is capable of making decisions on his/her own behalf. 5. Hypertension. Tenormin 50 mg daily, lisinopril 10 mg daily. Monitor with increased mobility 6. Question right lower lobe possible pneumonia. Empiric Avelox initiated 06/21/2012. Oxygen saturations greater than 90% on room air. Patient denies any shortness of breath or sputum production. Will monitor and followup chest x-ray 7. Tobacco abuse. NicoDerm patch. Discuss cessation of nicotine products 8. Hyperlipidemia. Zocor/lovaza 9. CAD/PTCA. Continue aspirin therapy. Patient denies any chest pain 06/22/2012, Ivory Broad, MD

## 2012-06-22 NOTE — Progress Notes (Signed)
Pt arrived to unit at 1530 via wheelchair. Family at bedside rehab process reviewed with pt and family and discussed safety plan with full understanding. Pt signed sheet. Pt resting in bed with call bell in reach with pt denying pain.

## 2012-06-22 NOTE — Interval H&P Note (Signed)
Clemon Vasiliou Spahr was admitted today to Inpatient Rehabilitation with the diagnosis of right above knee amputation.  The patient's history has been reviewed, patient examined, and there is no change in status.  Patient continues to be appropriate for intensive inpatient rehabilitation.  I have reviewed the patient's chart and labs.  Questions were answered to the patient's satisfaction.  Hanin Decook T 06/22/2012, 10:10 PM

## 2012-06-22 NOTE — Plan of Care (Signed)
Overall Plan of Care [facility]) Patient Details Name: Stephen Dean MRN: [id number] DOB: 02-Mar-1929  Diagnosis:    Primary Diagnosis:    S/P AKA (above knee amputation) Co-morbidities: CAD, pain mgt, pneumonia  Functional Problem List  Patient demonstrates impairments in the following areas: Balance, Edema, Endurance, Motor, Pain and Safety  Basic ADL's: grooming, bathing, dressing and toileting Advanced ADL's: NA  Transfers:  bed mobility, bed to chair, toilet, tub/shower, car and furniture Locomotion:  ambulation, wheelchair mobility and stairs  Additional Impairments:  None  Anticipated Outcomes Item Anticipated Outcome  Eating/Swallowing  Mod i  Basic self-care  Supervision  Tolieting  Mod I  Bowel/Bladder  Sup to Mod I  Transfers  Mod I  Locomotion  Mod I w/c, supervision gait  Communication    Cognition    Pain  3 or less  Safety/Judgment  supervision  Other     Therapy Plan:         Team Interventions: Item RN PT OT SLP SW TR Other  Self Care/Advanced ADL Retraining   x      Neuromuscular Re-Education  x x      Therapeutic Activities  x x      UE/LE Strength Training/ROM  x x      UE/LE Coordination Activities  x x      Visual/Perceptual Remediation/Compensation         DME/Adaptive Equipment Instruction  x x      Therapeutic Exercise  x x      Balance/Vestibular Training  x x      Patient/Family Education x x x      Cognitive Remediation/Compensation         Functional Mobility Training  x x      Ambulation/Gait Training  x x      Stair Training  x       Wheelchair Propulsion/Positioning  x x      Functional Tourist information centre manager Reintegration  x x      Dysphagia/Aspiration Film/video editor         Bladder Management         Bowel Management         Disease Management/Prevention x        Pain Management x x x      Medication Management x        Skin Care/Wound Management x   x      Splinting/Orthotics  x x      Discharge Planning  x x      Psychosocial Support                            Team Discharge Planning: Destination:  Home Projected Follow-up:  PT, OT and Home Health Projected Equipment Needs:  Bedside Commode, Tub Bench, Biomedical engineer involved in discharge planning:  Yes  MD ELOS: 5-7 days Medical Rehab Prognosis:  Good Assessment: Pt admitted for CIR therapies. The team will be addressing self-care, safety, pre-prosthetic education, fxnl mobility, pain mgt. Goals are modified independent to supervision.

## 2012-06-22 NOTE — Discharge Summary (Signed)
Discharge delayed one day due to insurance issues.  No change in clinical plan.  Leonides Sake, MD [facility] [place] Office: [phone] Pager: [phone]  06/22/2012, 8:04 AM

## 2012-06-23 ENCOUNTER — Inpatient Hospital Stay (HOSPITAL_COMMUNITY): Payer: Medicare Other | Admitting: *Deleted

## 2012-06-23 ENCOUNTER — Inpatient Hospital Stay (HOSPITAL_COMMUNITY): Payer: Medicare Other | Admitting: Physical Therapy

## 2012-06-23 ENCOUNTER — Inpatient Hospital Stay (HOSPITAL_COMMUNITY): Payer: Medicare Other | Admitting: Occupational Therapy

## 2012-06-23 DIAGNOSIS — L98499 Non-pressure chronic ulcer of skin of other sites with unspecified severity: Secondary | ICD-10-CM

## 2012-06-23 DIAGNOSIS — I739 Peripheral vascular disease, unspecified: Secondary | ICD-10-CM

## 2012-06-23 DIAGNOSIS — S78119A Complete traumatic amputation at level between unspecified hip and knee, initial encounter: Secondary | ICD-10-CM

## 2012-06-23 LAB — COMPREHENSIVE METABOLIC PANEL
ALT: 51 U/L (ref 0–53)
AST: 53 U/L — ABNORMAL HIGH (ref 0–37)
Albumin: 2.5 g/dL — ABNORMAL LOW (ref 3.5–5.2)
CO2: 29 mEq/L (ref 19–32)
Calcium: 8.5 mg/dL (ref 8.4–10.5)
Chloride: 99 mEq/L (ref 96–112)
Creatinine, Ser: 0.6 mg/dL (ref 0.50–1.35)
GFR calc non Af Amer: >90 mL/min (ref >90)
Sodium: 137 mEq/L (ref 135–145)
Total Bilirubin: 0.9 mg/dL (ref 0.3–1.2)

## 2012-06-23 LAB — CBC WITH DIFFERENTIAL/PLATELET
Basophils Absolute: 0 10*3/uL (ref 0.0–0.1)
Basophils Relative: 0 % (ref 0–1)
Lymphocytes Relative: 15 % (ref 12–46)
MCHC: 34.6 g/dL (ref 30.0–36.0)
Monocytes Absolute: 1 10*3/uL (ref 0.1–1.0)
Neutro Abs: 5.9 10*3/uL (ref 1.7–7.7)
Platelets: 200 10*3/uL (ref 150–400)
RDW: 13 % (ref 11.5–15.5)
WBC: 8.5 10*3/uL (ref 4.0–10.5)

## 2012-06-23 MED ORDER — POTASSIUM CHLORIDE CRYS ER 20 MEQ PO TBCR
20.0000 meq | EXTENDED_RELEASE_TABLET | Freq: Two times a day (BID) | ORAL | Status: AC
  Administered 2012-06-23 (×2): 20 meq via ORAL
  Filled 2012-06-23 (×2): qty 1

## 2012-06-23 MED ORDER — ENSURE COMPLETE PO LIQD
237.0000 mL | Freq: Two times a day (BID) | ORAL | Status: DC
  Administered 2012-06-24 – 2012-06-28 (×4): 237 mL via ORAL

## 2012-06-23 NOTE — Progress Notes (Signed)
Physical Therapy Note  Patient Details  Name: PHILIPE LASWELL MRN: [id number] Date of Birth: Oct 24, 1928 Today's Date: 06/23/2012  Time: 1630-1710 40 minutes  No c/o pain.  Transfer training squat pivot bed <> chair multiple attempts with focus on safety, pt improved with repetition.  W/c mobility outdoors on a variety of surfaces.  Pt required min A for up/down ramp for control, supervision with cues for safety on outdoor surfaces.  Pt with good activity tolerance requiring little rest with propulsion > 1000'.  Individual therapy   Romualdo Prosise 06/23/2012, 5:24 PM

## 2012-06-23 NOTE — Progress Notes (Signed)
Physical Therapy Session Note  Patient Details  Name: Stephen Dean MRN: [id number] Date of Birth: [birth year]  Today's Date: 06/23/2012 Time: 2956-2130 Time Calculation (min): 30 min  Skilled Therapeutic Interventions/Progress Updates:  Tx focused on safety with transfers, gait training with RW, standing tolerance in // bars and standing therex.  Pt needed consistent cues for safety with WC management and seemed to have decreased safety awareness, unable to stop walking prior to becoming too fatigued to safely continue. Reinforced importance of calling for assistance at all times in room, per wife's request.   Sit<>stand x4 throughout with Min A for steadying and good hand placement. Gait training 2x32' with RW and Min A with cues to stop walking once unable to clear foot. Good scapular depression with gait.   Static stance in // bars x56min with no LOB and bil UE assist.  Performed R hip EXT and ABD 3x10 with cues for technique as well as L heel raises x10.   Stand-pivot transfer WC>bed with Min A and good hand placement.      Therapy Documentation Precautions:  Precautions Precautions: Fall Restrictions Weight Bearing Restrictions: No   Pain: Pain Assessment Pain Assessment: No/denies pain Mobility: Transfers Sit to Stand: 4: Min assist Stand to Sit: 4: Min assist Stand Pivot Transfers: 4: Min assist Locomotion : Ambulation Ambulation/Gait Assistance: 4: Min Lawyer Distance: 150    See FIM for current functional status  Therapy/Group: Individual Therapy  Virl Cagey, PT 06/23/2012, 2:47 PM

## 2012-06-23 NOTE — Evaluation (Signed)
Physical Therapy Assessment and Plan  Patient Details  Name: Stephen Dean MRN: [id number] Date of Birth: Sep 23, 1928  PT Diagnosis: Difficulty walking and Muscle weakness Rehab Potential: Good ELOS: 5-7 days   Today's Date: 06/23/2012 Time: 0900-0955 Time Calculation (min): 55 min  Problem List:  Patient Active Problem List  Diagnosis  . S/P AKA (above knee amputation)    Past Medical History: History reviewed. No pertinent past medical history. Past Surgical History:  Past Surgical History  Procedure Date  . Hernia repair   . Cardiac surgery     Stent placement x 2  . Embolectomy 06/17/2012    Procedure: EMBOLECTOMY;  Surgeon: Fransisco Hertz, MD;  Location: [facility] OR;  Service: Vascular;  Laterality: Right;  Thrombectomy of right tibial arteries,exclusion of right popliteal aneurysm, femoral -popliteal artery bypass graft,four compartment fasciotomies  . Femoral-popliteal bypass graft 06/17/2012    Procedure: BYPASS GRAFT FEMORAL-POPLITEAL ARTERY;  Surgeon: Fransisco Hertz, MD;  Location: [facility] OR;  Service: Vascular;  Laterality: Right;  Using 6 mm x 80 cm propaten goretex graft  . Intraoperative arteriogram 06/17/2012    Procedure: INTRA OPERATIVE ARTERIOGRAM;  Surgeon: Fransisco Hertz, MD;  Location: [facility] OR;  Service: Vascular;  Laterality: Right;  . Fasciotomy 06/17/2012    Procedure: FASCIOTOMY;  Surgeon: Fransisco Hertz, MD;  Location: [facility] OR;  Service: Vascular;  Laterality: Right;  four compartment  fasciotomy  . Amputation 06/18/2012    Procedure: AMPUTATION ABOVE KNEE;  Surgeon: Pryor Ochoa, MD;  Location: [facility] OR;  Service: Vascular;  Laterality: Right;    Assessment & Plan Clinical Impression: Patient is a 78 y.o. year old male with recent admission to the hospital on 06/17/2012 with ischemic right leg and increasing rest pain. Patient had been very independent and active prior to admission. Patient had initially attempted to treat this with rest and over-the-counter  medications. Noted CPKs greater than 19,000 with findings of acute right foot drop. 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 Patient underwent thrombectomy as well as right common femoral artery to below-the-knee popliteal artery bypass 06/17/2012 per Dr. Imogene Burn. Close monitoring of extremity with ongoing ischemic changes and limb felt to be nonsalvageable and underwent right above-knee amputation 06/18/2012.  Patient transferred to CIR on 06/22/2012 .   Patient currently requires min with mobility secondary to muscle weakness and decreased standing balance and decreased balance strategies.  Prior to hospitalization, patient was independent with mobility and lived with Spouse in a House home.  Home access is 1Stairs to enter.  Patient will benefit from skilled PT intervention to maximize safe functional mobility, minimize fall risk and decrease caregiver burden for planned discharge home with intermittent assist.  Anticipate patient will benefit from follow up OP at discharge.  PT - End of Session Activity Tolerance: Tolerates 30+ min activity with multiple rests PT Assessment Rehab Potential: Good PT Plan PT Frequency: 1-2 X/day, 60-90 minutes Estimated Length of Stay: 5-7 days PT Treatment/Interventions: Ambulation/gait training;Community reintegration;DME/adaptive equipment instruction;Neuromuscular re-education;Therapeutic Exercise;UE/LE Strength taining/ROM;Splinting/orthotics;Pain management;Discharge planning;Balance/vestibular training;Patient/family education;Stair training;Therapeutic Activities;Functional mobility training;UE/LE Coordination activities;Wheelchair propulsion/positioning PT Recommendation Follow Up Recommendations: Outpatient PT Equipment Recommended: Wheelchair (measurements);Rolling walker with 5" wheels  PT Evaluation Precautions/Restrictions Precautions Precautions: Fall Restrictions Weight Bearing  Restrictions: No  Vital Signs  HR 108 bpm after gait Pain Pain Assessment Pain Assessment: No/denies pain Pain Score: 0-No pain Home Living/Prior Functioning Home Living Lives With: Spouse Available Help at Discharge: Family Type of  Home: House Home Access: Stairs to enter [employer] of Steps: 1 Home Layout: One level Prior Function Level of Independence: Independent with basic ADLs;Independent with gait;Independent with transfers Able to Take Stairs?: Yes Driving: Yes Vocation: Retired  IT consultant Overall Cognitive Status: Appears within functional limits for tasks assessed Orientation Level: Oriented X4 Safety/Judgment: Impaired Sensation Sensation Light Touch: Appears Intact (some phantom sensations) Proprioception: Appears Intact Coordination Gross Motor Movements are Fluid and Coordinated: Yes Motor  Motor Motor: Within Functional Limits  Mobility Bed Mobility Supine to Sit: 5: Supervision Transfers Stand Pivot Transfers: 4: Min assist Stand Pivot Transfer Details (indicate cue type and reason): with RW cues for safety with turning, assist for balance on turns and backing up Squat Pivot Transfers: 4: Min Designer, industrial/product Details (indicate cue type and reason): assist for wt shift, cues for safety and hand placement Locomotion  Ambulation Ambulation/Gait Assistance: 4: Min assist Ambulation Distance (Feet): 15 Feet Assistive device: Rolling walker Ambulation/Gait Assistance Details: steadying-min A needed for turning and backward gait Wheelchair Mobility Wheelchair Mobility: Yes Wheelchair Assistance: 5: Investment banker, operational: Both upper extremities Wheelchair Parts Management: Needs assistance Distance: 150  Trunk/Postural Assessment  Cervical Assessment Cervical Assessment: Within Functional Limits Thoracic Assessment Thoracic Assessment: Within Functional Limits Lumbar Assessment Lumbar Assessment: Within Functional  Limits Postural Control Postural Control: Within Functional Limits  Balance Static Sitting Balance Static Sitting - Level of Assistance: 5: Stand by assistance Dynamic Standing Balance Dynamic Standing - Balance Support: During functional activity Dynamic Standing - Level of Assistance: 4: Min assist Extremity Assessment      RLE Assessment RLE Assessment: Within Functional Limits LLE Assessment LLE Assessment: Within Functional Limits  See FIM for current functional status Refer to Care Plan for Long Term Goals  Recommendations for other services: None  Discharge Criteria: Patient will be discharged from PT if patient refuses treatment 3 consecutive times without medical reason, if treatment goals not met, if there is a change in medical status, if patient makes no progress towards goals or if patient is discharged from hospital.  The above assessment, treatment plan, treatment alternatives and goals were discussed and mutually agreed upon: by patient  Treatment initiated during session: Supine and sidelying therex fir LE strength and AROM.  Pt able to perform 2 x 15 with no rest breaks, good activity tolerance.  Seated core exercises with 5lb medicine ball for core strength and endurance.  W/c mobility household and controlled environments with supervision.  Pt educated on parts management and requires cues for safe w/c set up for transfers. Ellinore Merced 06/23/2012, 10:17 AM

## 2012-06-23 NOTE — Progress Notes (Signed)
INITIAL ADULT NUTRITION ASSESSMENT Date: 06/23/2012   Time: 3:41 PM  Reason for Assessment: Nutrition Risk report (MST=2)  INTERVENTION: Ensure Complete PO BID to maximize oral intake, each supplement provides 350 kcal and 13 grams of protein.  DOCUMENTATION CODES Per approved criteria  -Severe malnutrition in the context of chronic illness   ASSESSMENT: Male 78 y.o.  Dx: S/P AKA (above knee amputation)  Hx: tobacco abuse, constipation, ischemic right leg--S/P right AKA 10/25  Past Surgical History  Procedure Date  . Hernia repair   . Cardiac surgery     Stent placement x 2  . Embolectomy 06/17/2012    Procedure: EMBOLECTOMY;  Surgeon: Fransisco Hertz, MD;  Location: [facility] OR;  Service: Vascular;  Laterality: Right;  Thrombectomy of right tibial arteries,exclusion of right popliteal aneurysm, femoral -popliteal artery bypass graft,four compartment fasciotomies  . Femoral-popliteal bypass graft 06/17/2012    Procedure: BYPASS GRAFT FEMORAL-POPLITEAL ARTERY;  Surgeon: Fransisco Hertz, MD;  Location: [facility] OR;  Service: Vascular;  Laterality: Right;  Using 6 mm x 80 cm propaten goretex graft  . Intraoperative arteriogram 06/17/2012    Procedure: INTRA OPERATIVE ARTERIOGRAM;  Surgeon: Fransisco Hertz, MD;  Location: [facility] OR;  Service: Vascular;  Laterality: Right;  . Fasciotomy 06/17/2012    Procedure: FASCIOTOMY;  Surgeon: Fransisco Hertz, MD;  Location: [facility] OR;  Service: Vascular;  Laterality: Right;  four compartment  fasciotomy  . Amputation 06/18/2012    Procedure: AMPUTATION ABOVE KNEE;  Surgeon: Pryor Ochoa, MD;  Location: [facility] OR;  Service: Vascular;  Laterality: Right;    Related Meds:  Scheduled Meds:   . aspirin  81 mg Oral Daily  . atenolol  50 mg Oral Daily  . influenza  inactive virus vaccine  0.5 mL Intramuscular Tomorrow-1000  . lisinopril  10 mg Oral Daily  . moxifloxacin  400 mg Oral q1800  . nicotine  21 mg Transdermal Daily  . omega-3 acid ethyl esters  1 g Oral BID  .  pantoprazole  40 mg Oral Q1200  . pneumococcal 23 valent vaccine  0.5 mL Intramuscular Tomorrow-1000  . potassium chloride  20 mEq Oral BID  . simvastatin  20 mg Oral q1800   Continuous Infusions:  PRN Meds:.acetaminophen, alum & mag hydroxide-simeth, bisacodyl, guaiFENesin-dextromethorphan, metoprolol, ondansetron (ZOFRAN) IV, ondansetron, oxyCODONE-acetaminophen, phenol, polyethylene glycol, sorbitol   Ht: 5\' 11"  (180.3 cm)  Wt: 164 lb 14.5 oz (74.8 kg)  Ideal Wt: 73.6 kg (adjusted for AKA) % Ideal Wt: 102%  Wt Readings from Last 10 Encounters:  06/23/12 164 lb 14.5 oz (74.8 kg)  06/18/12 180 lb 8.9 oz (81.9 kg)  06/18/12 180 lb 8.9 oz (81.9 kg)  06/18/12 180 lb 8.9 oz (81.9 kg)   Usual Wt: 200 lb 2 months prior to hospital admission % Usual Wt: 82% (9% weight loss related to amputation, 10% weight loss in 2 months prior to hospital admission)  Body mass index is 23.00 kg/(m^2).  Food/Nutrition Related Hx: decreased appetite with fair PO intake  Labs:  CMP     Component Value Date/Time   NA 137 06/23/2012 0509   K 3.4* 06/23/2012 0509   CL 99 06/23/2012 0509   CO2 29 06/23/2012 0509   GLUCOSE 107* 06/23/2012 0509   BUN 11 06/23/2012 0509   CREATININE 0.60 06/23/2012 0509   CALCIUM 8.5 06/23/2012 0509   PROT 5.7* 06/23/2012 0509   ALBUMIN 2.5* 06/23/2012 0509   AST 53* 06/23/2012 0509   ALT 51 06/23/2012 0509  ALKPHOS 59 06/23/2012 0509   BILITOT 0.9 06/23/2012 0509   GFRNONAA >90 06/23/2012 0509   GFRAA >90 06/23/2012 0509     Intake/Output Summary (Last 24 hours) at 06/23/12 1548 Last data filed at 06/23/12 1300  Gross per 24 hour  Intake    840 ml  Output   1100 ml  Net   -260 ml    Diet Order: Heart Healthy  IVF:  None  Estimated Nutritional Needs:   Kcal: 1900-2100 Protein: 90-105 gm Fluid: >2 liters  Patient reports intake has been fair.  Was drinking a supplement prior to hospital admission because he had been losing weight.  Now with  increased protein and calorie needs due to recent amputation.  Nutrition Focused Physical Exam: Subcutaneous Fat:  Orbital Region: WNL Upper Arm Region: mild-moderate depletion  Thoracic and Lumbar Region: WNL  Muscle:  Temple Region: severe depletion Clavicle Bone Region: WNL Clavicle and Acromion Bone Region: WNL Scapular Bone Region: WNL Dorsal Hand: WNL Patellar Region: WNL Anterior Thigh Region: WNL Posterior Calf Region: WNL  Pt meets criteria for severe malnutrition in the context of chronic illness as evidenced by 10% weight loss in <3 months and severe muscle loss.   NUTRITION DIAGNOSIS: Malnutrition related to inadequate oral intake as evidenced by 10% weight loss in <3 months and severe muscle loss.  MONITORING/EVALUATION(Goals): Goal:  Intake to meet >90% of estimated nutrition needs. Monitor:  PO intake, weight trend, labs, I/O  EDUCATION NEEDS: -Education needs addressed--discussed ways to increase intake of protein and calories to support repletion of nutrient stores and wound healing.   Joaquin Courts, RD, LDN, CNSC Pager# [phone] After Hours Pager# [phone]  06/23/2012, 3:41 PM

## 2012-06-23 NOTE — Progress Notes (Signed)
Patient information reviewed and entered into eRehab system by Castle Lamons, RN, CRRN, PPS Coordinator.  Information including medical coding and functional independence measure will be reviewed and updated through discharge.     Per nursing patient was given "Data Collection Information Summary for Patients in Inpatient Rehabilitation Facilities with attached "Privacy Act Statement-Health Care Records" upon admission.  

## 2012-06-23 NOTE — Progress Notes (Signed)
Subjective/Complaints: Slept well. Sleep a little broken. Pain under reasonable control  Objective: Vital Signs: Blood pressure 139/77, pulse 85, temperature 98.1 F (36.7 C), temperature source Oral, resp. rate 18, height 5\' 11"  (1.803 m), weight 74.8 kg (164 lb 14.5 oz), SpO2 94.00%. No results found.  Basename 06/23/12 0509  WBC 8.5  HGB 11.9*  HCT 34.4*  PLT 200    Basename 06/23/12 0509  NA 137  K 3.4*  CL 99  CO2 29  GLUCOSE 107*  BUN 11  CREATININE 0.60  CALCIUM 8.5   CBG (last 3)  No results found for this basename: GLUCAP:3 in the last 72 hours  Wt Readings from Last 3 Encounters:  06/23/12 74.8 kg (164 lb 14.5 oz)  06/18/12 81.9 kg (180 lb 8.9 oz)  06/18/12 81.9 kg (180 lb 8.9 oz)    Physical Exam:  Constitutional: He is oriented to person, place, and time. He appears well-developed.  HENT:  Head: Normocephalic.  Eyes:  Pupils round and reactive to light  Neck: Neck supple. No thyromegaly present.  Cardiovascular: Normal rate and regular rhythm. No murmur  Pulmonary/Chest: Effort normal and breath sounds normal,  . He has no wheezes. No distress.  Abdominal: Bowel sounds are normal. There is no tenderness.  Neurological: He is alert and oriented to person, place, and time. Cn exam normal. Follows full commands. Strength 4/5 except for right AK. Able to flex right hip somewhat agst gravity. Sensory exam grossly intact  Skin:  Right AKA site is dressed with dry dressing. Area with minimal serosanginuous dc. Leg is appropriately tender.  Psychiatric: He has a normal mood and affect      Assessment/Plan: 1. Functional deficits secondary to right AKA which require 3+ hours per day of interdisciplinary therapy in a comprehensive inpatient rehab setting. Physiatrist is providing close team supervision and 24 hour management of active medical problems listed below. Physiatrist and rehab team continue to assess barriers to discharge/monitor patient progress  toward functional and medical goals. FIM:                      Expression Expression Mode: Verbal Expression: 5-Expresses basic needs/ideas: With no assist  Social Interaction Social Interaction: 5-Interacts appropriately 90% of the time - Needs monitoring or encouragement for participation or interaction.  Problem Solving Problem Solving: 5-Solves basic problems: With no assist  Memory Memory: 5-Recognizes or recalls 90% of the time/requires cueing < 10% of the time  Medical Problem List and Plan:  1. 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 DVT Prophylaxis/Anticoagulation: SCDs left lower extremity. Monitor for any signs of DVT  3. Pain Management: Percocet as needed. Monitor with increased mobility. Tolerable at present  4. Neuropsych: This patient is capable of making decisions on his/her own behalf.  5. Hypertension. Tenormin 50 mg daily, lisinopril 10 mg daily. Monitor with increased mobility  6. Question right lower lobe possible pneumonia. Empiric Avelox initiated 06/21/2012. Oxygen saturations greater than 90% on room air. Patient denies any shortness of breath or sputum production. Will monitor and followup chest x-ray tomorrow. i see no signs of pneumonia clnically at this point 7. Tobacco abuse. NicoDerm patch. Discuss cessation of nicotine products  8. Hyperlipidemia. Zocor/lovaza  9. CAD/PTCA. Continue aspirin therapy. Patient denies any chest pain  LOS (Days) 1 A FACE TO FACE EVALUATION WAS PERFORMED  Amoree Newlon T 06/23/2012,  8:05 AM

## 2012-06-23 NOTE — Evaluation (Signed)
Occupational Therapy Assessment and Plan  Patient Details  Name: Stephen Dean MRN: [id number] Date of Birth: 08-30-1928  OT Diagnosis: muscle weakness (generalized) Rehab Potential: Rehab Potential: Good ELOS: 5-7 days   Today's Date: 06/23/2012 Time: 0900-0955 Time Calculation (min): 55 min  Problem List:  Patient Active Problem List  Diagnosis  . S/P AKA (above knee amputation)    Past Medical History: History reviewed. No pertinent past medical history. Past Surgical History:  Past Surgical History  Procedure Date  . Hernia repair   . Cardiac surgery     Stent placement x 2  . Embolectomy 06/17/2012    Procedure: EMBOLECTOMY;  Surgeon: Fransisco Hertz, MD;  Location: [facility] OR;  Service: Vascular;  Laterality: Right;  Thrombectomy of right tibial arteries,exclusion of right popliteal aneurysm, femoral -popliteal artery bypass graft,four compartment fasciotomies  . Femoral-popliteal bypass graft 06/17/2012    Procedure: BYPASS GRAFT FEMORAL-POPLITEAL ARTERY;  Surgeon: Fransisco Hertz, MD;  Location: [facility] OR;  Service: Vascular;  Laterality: Right;  Using 6 mm x 80 cm propaten goretex graft  . Intraoperative arteriogram 06/17/2012    Procedure: INTRA OPERATIVE ARTERIOGRAM;  Surgeon: Fransisco Hertz, MD;  Location: [facility] OR;  Service: Vascular;  Laterality: Right;  . Fasciotomy 06/17/2012    Procedure: FASCIOTOMY;  Surgeon: Fransisco Hertz, MD;  Location: [facility] OR;  Service: Vascular;  Laterality: Right;  four compartment  fasciotomy  . Amputation 06/18/2012    Procedure: AMPUTATION ABOVE KNEE;  Surgeon: Pryor Ochoa, MD;  Location: [facility] OR;  Service: Vascular;  Laterality: Right;    Assessment & Plan Clinical Impression: Patient is a 78 y.o. year old right-handed male admitted 06/17/2012 with ischemic right leg and increasing rest pain. Patient had been very independent and active prior to admission. Patient had initially attempted to treat this with rest and over-the-counter medications. Noted  CPKs greater than 19,000 with findings of acute right foot drop. 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 Patient underwent thrombectomy as well as right common femoral artery to below-the-knee popliteal artery bypass 06/17/2012 per Dr. Imogene Burn. Close monitoring of extremity with ongoing ischemic changes and limb felt to be nonsalvageable and underwent right above-knee amputation 06/18/2012. Postoperative pain management. [employer] prost[employer] was consulted for pre-prosthetic equipment needs. Patient placed on a NicoDerm patch for history of tobacco abuse. A chest x-ray completed showing consolidation right lower lobe possible pneumonia and placed on Avelox therapy with recent admission to the hospital Patient transferred to CIR on 06/22/2012 .    Patient currently requires min with basic self-care skills secondary to muscle weakness and decreased standing balance, decreased postural control and decreased balance strategies.  Prior to hospitalization, patient could complete ADLs with Independence.  Patient will benefit from skilled intervention to decrease level of assist with basic self-care skills and increase independence with basic self-care skills prior to discharge home with care partner.  Anticipate patient will require intermittent supervision and follow up home health.  OT - End of Session Activity Tolerance: Tolerates 30+ min activity without fatigue OT Assessment Rehab Potential: Good OT Plan OT Frequency: 1-2 X/day, 60-90 minutes Estimated Length of Stay: 5-7 days OT Treatment/Interventions: Balance/vestibular training;Community reintegration;Discharge planning;DME/adaptive equipment instruction;Functional mobility training;Pain management;Patient/family education;Self Care/advanced ADL retraining;Skin care/wound managment;Therapeutic Activities;Splinting/orthotics;Therapeutic Exercise;UE/LE Strength taining/ROM;UE/LE Coordination  activities;Wheelchair propulsion/positioning OT Recommendation Equipment Recommended: Wheelchair (measurements);Rolling walker with 5" wheels  OT Evaluation Precautions/Restrictions  Precautions Precautions: Fall Restrictions Weight Bearing Restrictions: No General Chart Reviewed: Yes  Family/Caregiver Present: Yes (pt's wife) Pain Pain Assessment Pain Assessment: No/denies pain Pain Score: 0-No pain Home Living/Prior Functioning Home Living Lives With: Spouse Available Help at Discharge: Family Type of Home: House Home Access: Stairs to enter Secretary/administrator of Steps: 1 Entrance Stairs-Rails: None Home Layout: One level Bathroom Shower/Tub: Forensic scientist: Standard Additional Comments: has access to 3-in-1 Prior Function Level of Independence: Independent with basic ADLs Able to Take Stairs?: Yes Driving: Yes Vocation: Retired ADL  See FIM Vision/Perception  Vision - History Baseline Vision: Wears glasses only for reading Patient Visual Report: No change from baseline  Cognition Overall Cognitive Status: Appears within functional limits for tasks assessed Arousal/Alertness: Awake/alert Orientation Level: Oriented X4 Safety/Judgment: Impaired Sensation Sensation Light Touch: Appears Intact Proprioception: Appears Intact Coordination Gross Motor Movements are Fluid and Coordinated: Yes Fine Motor Movements are Fluid and Coordinated: Yes (mild bilateral UE tremors premorbidly with intention) Motor  Motor Motor: Within Functional Limits Mobility  Bed Mobility Supine to Sit: 5: Supervision Transfers Sit to Stand: 4: Min assist Stand to Sit: 4: Min assist  Trunk/Postural Assessment  Cervical Assessment Cervical Assessment: Within Functional Limits Thoracic Assessment Thoracic Assessment: Within Functional Limits Lumbar Assessment Lumbar Assessment: Within Functional Limits Postural Control Postural Control: Within  Functional Limits  Balance Static Sitting Balance Static Sitting - Balance Support: Feet supported;No upper extremity supported Static Sitting - Level of Assistance: 5: Stand by assistance Dynamic Standing Balance Dynamic Standing - Balance Support: During functional activity Dynamic Standing - Level of Assistance: 4: Min assist Extremity/Trunk Assessment RUE Assessment RUE Assessment: Within Functional Limits LUE Assessment LUE Assessment: Within Functional Limits  See FIM for current functional status Refer to Care Plan for Long Term Goals  Recommendations for other services: None  Discharge Criteria: Patient will be discharged from OT if patient refuses treatment 3 consecutive times without medical reason, if treatment goals not met, if there is a change in medical status, if patient makes no progress towards goals or if patient is discharged from hospital.  The above assessment, treatment plan, treatment alternatives and goals were discussed and mutually agreed upon: by patient and by family (pt's wife).  1100-1200 OT eval initiated; role, purpose and goals of OT discussed with patient and pt's wife. Self care retraining with bathing at shower level, dressing, and grooming tasks focusing on basic transfers, sit<>stands and dynamic standing balance with RW during ADL tasks.  Jackelyn Poling 06/23/2012, 12:19 PM

## 2012-06-24 ENCOUNTER — Inpatient Hospital Stay (HOSPITAL_COMMUNITY): Payer: Medicare Other

## 2012-06-24 ENCOUNTER — Inpatient Hospital Stay (HOSPITAL_COMMUNITY): Payer: Medicare Other | Admitting: Physical Therapy

## 2012-06-24 NOTE — Progress Notes (Signed)
Subjective/Complaints: Slept well. No breathing issues.. Pain under reasonable control  Objective: Vital Signs: Blood pressure 133/70, pulse 81, temperature 98.2 F (36.8 C), temperature source Oral, resp. rate 19, height 5\' 11"  (1.803 m), weight 74.8 kg (164 lb 14.5 oz), SpO2 96.00%. Dg Chest 2 View  06/24/2012  *RADIOLOGY REPORT*  Clinical Data: Short of breath  CHEST - 2 VIEW  Comparison: Chest radiograph 06/19/2012  Findings: Normal cardiac silhouette.  There is interval improvement in the right lower lobe air space disease.  There is mild persistent air space disease.  Nipple shadows noted.  No pneumothorax.  Trace pleural fluid on the right.  IMPRESSION:  1.  Interval decrease in density of right lower lobe pneumonia.   Original Report Authenticated By: Genevive Bi, M.D.     Basename 06/23/12 0509  WBC 8.5  HGB 11.9*  HCT 34.4*  PLT 200    Basename 06/23/12 0509  NA 137  K 3.4*  CL 99  CO2 29  GLUCOSE 107*  BUN 11  CREATININE 0.60  CALCIUM 8.5   CBG (last 3)  No results found for this basename: GLUCAP:3 in the last 72 hours  Wt Readings from Last 3 Encounters:  06/23/12 74.8 kg (164 lb 14.5 oz)  06/18/12 81.9 kg (180 lb 8.9 oz)  06/18/12 81.9 kg (180 lb 8.9 oz)    Physical Exam:  Constitutional: He is oriented to person, place, and time. He appears well-developed.  HENT:  Head: Normocephalic.  Eyes:  Pupils round and reactive to light  Neck: Neck supple. No thyromegaly present.  Cardiovascular: Normal rate and regular rhythm. No murmur  Pulmonary/Chest: Effort normal and breath sounds normal,  . He has no wheezes. No distress.  Abdominal: Bowel sounds are normal. There is no tenderness.  Neurological: He is alert and oriented to person, place, and time. Cn exam normal. Follows full commands. Strength 4/5 except for right AK. Able to flex right hip somewhat agst gravity. Sensory exam grossly intact  Skin:  Right AKA site is dressed with dry dressing. Area  with minimal serosanginuous dc. Leg is appropriately tender.  Psychiatric: He has a normal mood and affect      Assessment/Plan: 1. Functional deficits secondary to right AKA which require 3+ hours per day of interdisciplinary therapy in a comprehensive inpatient rehab setting. Physiatrist is providing close team supervision and 24 hour management of active medical problems listed below. Physiatrist and rehab team continue to assess barriers to discharge/monitor patient progress toward functional and medical goals. FIM: FIM - Bathing Bathing Steps Patient Completed: Chest;Right Arm;Left Arm;Abdomen Bathing: 5: Supervision: Safety issues/verbal cues (pt declined to bathe LB)  FIM - Upper Body Dressing/Undressing Upper body dressing/undressing steps patient completed: Thread/unthread right sleeve of pullover shirt/dresss;Thread/unthread left sleeve of pullover shirt/dress;Put head through opening of pull over shirt/dress;Pull shirt over trunk Upper body dressing/undressing: 6: More than reasonable amount of time FIM - Lower Body Dressing/Undressing Lower body dressing/undressing steps patient completed: Thread/unthread right underwear leg;Thread/unthread left underwear leg;Thread/unthread right pants leg;Pull underwear up/down;Thread/unthread left pants leg;Pull pants up/down;Don/Doff left sock;Don/Doff left shoe;Fasten/unfasten left shoe;Fasten/unfasten pants Lower body dressing/undressing: 0: Activity did not occur (pt declined LB dressing)  FIM - Toileting Toileting steps completed by patient: Adjust clothing prior to toileting;Performs perineal hygiene;Adjust clothing after toileting Toileting Assistive Devices: Grab bar or rail for support Toileting: 4: Steadying assist  FIM - Diplomatic Services operational officer Devices: Elevated toilet seat Toilet Transfers: 4-To toilet/BSC: Min A (steadying Pt. > 75%);4-From toilet/BSC: Min A (  steadying Pt. > 75%)  FIM - Physiological scientist Devices: Walker;Arm rests Bed/Chair Transfer: 4: Chair or W/C > Bed: Min A (steadying Pt. > 75%)  FIM - Locomotion: Wheelchair Distance: 150 Locomotion: Wheelchair: 5: Travels 150 ft or more: maneuvers on rugs and over door sills with supervision, cueing or coaxing FIM - Locomotion: Ambulation Locomotion: Ambulation Assistive Devices: Designer, industrial/product Ambulation/Gait Assistance: 4: Min assist Locomotion: Ambulation: 1: Travels less than 50 ft with minimal assistance (Pt.>75%)  Comprehension Comprehension Mode: Auditory Comprehension: 5-Follows basic conversation/direction: With no assist  Expression Expression Mode: Verbal Expression: 5-Expresses basic needs/ideas: With extra time/assistive device  Social Interaction Social Interaction: 7-Interacts appropriately with others - No medications needed.  Problem Solving Problem Solving: 5-Solves basic problems: With no assist  Memory Memory: 5-Recognizes or recalls 90% of the time/requires cueing < 10% of the time  Medical Problem List and Plan:  1. 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 DVT Prophylaxis/Anticoagulation: SCDs left lower extremity. Monitor for any signs of DVT  3. Pain Management: Percocet as needed. Monitor with increased mobility. Tolerable at present  4. Neuropsych: This patient is capable of making decisions on his/her own behalf.  5. Hypertension. Tenormin 50 mg daily, lisinopril 10 mg daily. Monitor with increased mobility  6. Question right lower lobe possible pneumonia. Empiric Avelox initiated 06/21/2012. CXR showing improvement. Continue avelox for now 7. Tobacco abuse. NicoDerm patch. Discuss cessation of nicotine products  8. Hyperlipidemia. Zocor/lovaza  9. CAD/PTCA. Continue aspirin therapy. Patient denies any chest pain 10. Hypokalemia-  continue supplement  LOS (Days) 2 A FACE TO FACE EVALUATION WAS PERFORMED  Syra Sirmons T 06/24/2012, 8:21 AM

## 2012-06-24 NOTE — Progress Notes (Signed)
Occupational Therapy Session Note  Patient Details  Name: RAYLIN WINER MRN: [id number] Date of Birth: Nov 30, 1928  Today's Date: 06/24/2012 Time: 0700-0755 Time Calculation (min): 55 min  Short Term Goals: Week 1:  OT Short Term Goal 1 (Week 1): STGs=LTGs (short length of stay)  Pt denies pain.  Skilled Therapeutic Interventions/Progress Updates:    Pt seated in w/c upon arrival.  Pt stated he needed to use bathroom and he wanted to see if he could do it without assistance.  Pt attempted to position w/c to transfer to standard toilet but required verbal cues to position safely.  Pt transferred to toilet using grab bars with steady assist.  Pt required assistance while standing to fasten pants.  Pt declined LB bathing and dressing but wanted to bathe UB and change shirt.  Pt transitioned to tub room to practice tub bench transfers and then to gym for dynamic standing activities at table.  Pt exhibited slight impulsivity during transfers and required min verbal cues for safety awareness.    Therapy Documentation Precautions:  Precautions Precautions: Fall Restrictions Weight Bearing Restrictions: No    See FIM for current functional status  Therapy/Group: Individual Therapy  Rich Brave 06/24/2012, 8:06 AM

## 2012-06-24 NOTE — Progress Notes (Deleted)
MD on call notified regarding elevated Orthostatic B/P. No new orders noted. Will continue to monitor frequently.

## 2012-06-24 NOTE — Progress Notes (Signed)
Physical Therapy Session Note  Patient Details  Name: Stephen Dean MRN: [id number] Date of Birth: 03-Sep-1928  Today's Date: 06/24/2012 Time: 4098-1191 Time Calculation (min): 47 min  Short Term Goals: Week 1:     Skilled Therapeutic Interventions/Progress Updates:  Pt propelling wheelchair with supervision >150' on carpeted surface x2. Stand pivot transfers WC to mat table X6 using RW with cuing for improved safety of WC placement prior to transferring.  Right lower extremity strengthening in supine 3x10: hip abduction, glut sets, isometric hip extension; in side-lying hip extension and hip abduction.  Standing at edge of mat table with RW pt working on transferring weight into shoulders for stepping up onto curb height step.  Cuing pt for transfers, pt performed step-ups onto curb height step x8 reps with extended rest break required half way through.     Therapy Documentation Precautions:  Precautions Precautions: Fall Restrictions Weight Bearing Restrictions: No   Pain: Pain Assessment Pain Assessment: No/denies pain   See FIM for current functional status  Therapy/Group: Individual Therapy  Rexene Agent 06/24/2012, 12:49 PM

## 2012-06-24 NOTE — Progress Notes (Signed)
Physical Therapy Session Note  Patient Details  Name: Stephen Dean MRN: [id number] Date of Birth: 1928/12/16  Today's Date: 06/24/2012 Time: 4098-1191 Time Calculation (min): 28 min  Short Term Goals: Week 1:     Skilled Therapeutic Interventions/Progress Updates:    Therapy focused on standing dynamic balance and gait in home environment.  Pt amb with RW and min assist.  Worked on side stepping with RW which pt needed increased assist to balance.  Worked on squat-pivot transfers w/c-reclincer.  Pt needs cues for proper angle placement.  UE push ups in recliner x 10 reps x 2.  Therapy Documentation Precautions:  Precautions Precautions: Fall Restrictions Weight Bearing Restrictions: No Pain: Pain Assessment Pain Assessment: No/denies pain Does report phantom sensation.   Locomotion : Ambulation Ambulation/Gait Assistance: 4: Min assist     See FIM for current functional status  Therapy/Group: Individual Therapy  Newell Coral 06/24/2012, 12:19 PM

## 2012-06-24 NOTE — Evaluation (Signed)
This note has been reviewed and this clinician agrees with information provided.  

## 2012-06-24 NOTE — Progress Notes (Signed)
Physical Therapy Session Note  Patient Details  Name: GWEN EDLER MRN: [id number] Date of Birth: 1929/05/23  Today's Date: 06/24/2012 Time: 0830-0925 Time Calculation (min): 55 min  Short Term Goals: Week 1:     Skilled Therapeutic Interventions/Progress Updates:    Wheelchair mobility over controlled and home environments x >200' total with supervision. Squat pivot transfers to/from low couch with min assist and  min/mod verbal cues for set up of wheelchair and safety. Pt tends to place wheelchair too far from transfer surface and at poor angles. Stand pivot to/from actual bed min-guard assist. Prone stretch, pt encouraged to perform in bed prior to going to sleep to decrease risk of hip flexion contracture. Hip extension, side lying hip abduction 2 x 10 reps each with facilitation of appropriate musculature. Gait 2 x 50' with RW, min assist, cues to avoid getting too close to walker.    Therapy Documentation Precautions:  Precautions Precautions: Fall Restrictions Weight Bearing Restrictions: No Pain: Pain Assessment Pain Assessment: No/denies pain   See FIM for current functional status  Therapy/Group: Individual Therapy  Wilhemina Bonito 06/24/2012, 12:20 PM

## 2012-06-24 NOTE — Progress Notes (Signed)
[facility] Individual Statement of Services  Patient Name:  Stephen Dean  Date:  06/24/2012  Welcome [facility] the [facility].  Our goal is to provide you with an individualized program based on your diagnosis and situation, designed to meet your specific needs.  With this comprehensive rehabilitation program, you will be expected to participate in at least 3 hours of rehabilitation therapies Monday-Friday, with modified therapy programming on the weekends.  Your rehabilitation program will include the following services:  Physical Therapy (PT), Occupational Therapy (OT), 24 hour per day rehabilitation nursing, Therapeutic Recreaction (TR), Psychology, Case Management (Social Worker), Rehabilitation Medicine, Nutrition Services and Pharmacy Services  Weekly team conferences will be held on Tuesdays to discuss your progress.  Your  Social Worker will talk with you frequently to get your input and to update you on team discussions.  Team conferences with you and your family in attendance may also be held.  Expected length of stay: 5-7 days  Overall anticipated outcome: modified independent (w/c) to minimal                                                                                                                         assistance  Depending on your progress and recovery, your program may change.  Your  Social Worker will coordinate services and will keep you informed of any changes.  Your  Social Worker's  name and contact numbers are listed  below.  The following services may also be recommended but are not provided by the [facility]:   Driving Evaluations  Home Health Rehabiltiation Services  Outpatient Rehabilitatation [facility]  Vocational Rehabilitation   Arrangements will be made to provide these services after discharge if needed.  Arrangements include referral to agencies that provide these services.  Your insurance  has been verified to be:  1. Medicare 2. [employer]  3.  [employer] primary doctor is:  None - Social Work with assist with establishing "new patient" referral  Pertinent information will be shared with your doctor and your insurance company.  Social Worker:  [place], [place] [phone] or ([phone]  Information discussed with and copy given to patient by: Amada Jupiter, 06/24/2012, 1:17 PM

## 2012-06-25 ENCOUNTER — Inpatient Hospital Stay (HOSPITAL_COMMUNITY): Payer: Medicare Other

## 2012-06-25 ENCOUNTER — Inpatient Hospital Stay (HOSPITAL_COMMUNITY): Payer: Medicare Other | Admitting: Physical Therapy

## 2012-06-25 DIAGNOSIS — I743 Embolism and thrombosis of arteries of the lower extremities: Secondary | ICD-10-CM

## 2012-06-25 DIAGNOSIS — I2510 Atherosclerotic heart disease of native coronary artery without angina pectoris: Secondary | ICD-10-CM

## 2012-06-25 DIAGNOSIS — L98499 Non-pressure chronic ulcer of skin of other sites with unspecified severity: Secondary | ICD-10-CM

## 2012-06-25 DIAGNOSIS — I739 Peripheral vascular disease, unspecified: Secondary | ICD-10-CM

## 2012-06-25 DIAGNOSIS — S78119A Complete traumatic amputation at level between unspecified hip and knee, initial encounter: Secondary | ICD-10-CM

## 2012-06-25 NOTE — Progress Notes (Signed)
Subjective/Complaints: Happy with progress. Pain under reasonable control.   Objective: Vital Signs: Blood pressure 128/66, pulse 78, temperature 98.2 F (36.8 C), temperature source Oral, resp. rate 16, height 5\' 11"  (1.803 m), weight 74.8 kg (164 lb 14.5 oz), SpO2 98.00%. Dg Chest 2 View  06/24/2012  *RADIOLOGY REPORT*  Clinical Data: Short of breath  CHEST - 2 VIEW  Comparison: Chest radiograph 06/19/2012  Findings: Normal cardiac silhouette.  There is interval improvement in the right lower lobe air space disease.  There is mild persistent air space disease.  Nipple shadows noted.  No pneumothorax.  Trace pleural fluid on the right.  IMPRESSION:  1.  Interval decrease in density of right lower lobe pneumonia.   Original Report Authenticated By: Genevive Bi, M.D.     Basename 06/23/12 0509  WBC 8.5  HGB 11.9*  HCT 34.4*  PLT 200    Basename 06/23/12 0509  NA 137  K 3.4*  CL 99  CO2 29  GLUCOSE 107*  BUN 11  CREATININE 0.60  CALCIUM 8.5   CBG (last 3)  No results found for this basename: GLUCAP:3 in the last 72 hours  Wt Readings from Last 3 Encounters:  06/23/12 74.8 kg (164 lb 14.5 oz)  06/18/12 81.9 kg (180 lb 8.9 oz)  06/18/12 81.9 kg (180 lb 8.9 oz)    Physical Exam:  Constitutional: He is oriented to person, place, and time. He appears well-developed.  HENT:  Head: Normocephalic.  Eyes:  Pupils round and reactive to light  Neck: Neck supple. No thyromegaly present.  Cardiovascular: Normal rate and regular rhythm. No murmur  Pulmonary/Chest: Effort normal and breath sounds normal,  . He has no wheezes. No distress.  Abdominal: Bowel sounds are normal. There is no tenderness.  Neurological: He is alert and oriented to person, place, and time. Cn exam normal. Follows full commands. Strength 4/5 except for right AK. Able to flex right hip somewhat agst gravity. Sensory exam grossly intact  Skin:  Right AKA site is dressed with dry dressing. Area with minimal  serosanginuous dc. Leg is appropriately tender.  Psychiatric: He has a normal mood and affect      Assessment/Plan: 1. Functional deficits secondary to right AKA which require 3+ hours per day of interdisciplinary therapy in a comprehensive inpatient rehab setting. Physiatrist is providing close team supervision and 24 hour management of active medical problems listed below. Physiatrist and rehab team continue to assess barriers to discharge/monitor patient progress toward functional and medical goals. FIM: FIM - Bathing Bathing Steps Patient Completed: Chest;Right Arm;Left Arm;Abdomen;Front perineal area;Buttocks;Right upper leg;Left upper leg;Left lower leg (including foot);Right lower leg (including foot) Bathing: 5: Supervision: Safety issues/verbal cues (pt declined to bathe LB)  FIM - Upper Body Dressing/Undressing Upper body dressing/undressing steps patient completed: Thread/unthread right sleeve of pullover shirt/dresss;Thread/unthread left sleeve of pullover shirt/dress;Put head through opening of pull over shirt/dress;Pull shirt over trunk Upper body dressing/undressing: 6: More than reasonable amount of time FIM - Lower Body Dressing/Undressing Lower body dressing/undressing steps patient completed: Thread/unthread right underwear leg;Thread/unthread left underwear leg;Pull underwear up/down;Pull pants up/down;Thread/unthread left pants leg;Thread/unthread right pants leg;Don/Doff left sock;Don/Doff left shoe;Fasten/unfasten left shoe Lower body dressing/undressing: 5: Supervision: Safety issues/verbal cues  FIM - Toileting Toileting steps completed by patient: Adjust clothing prior to toileting;Performs perineal hygiene;Adjust clothing after toileting Toileting Assistive Devices: Grab bar or rail for support Toileting: 4: Steadying assist  FIM - Diplomatic Services operational officer Devices: Elevated toilet seat Toilet Transfers: 0-Activity did not  occur  FIM -  Press photographer Assistive Devices: Arm rests Bed/Chair Transfer: 5: Supine > Sit: Supervision (verbal cues/safety issues);5: Bed > Chair or W/C: Supervision (verbal cues/safety issues)  FIM - Locomotion: Wheelchair Distance: 150 Locomotion: Wheelchair: 5: Travels 150 ft or more: maneuvers on rugs and over door sills with supervision, cueing or coaxing FIM - Locomotion: Ambulation Locomotion: Ambulation Assistive Devices: Designer, industrial/product Ambulation/Gait Assistance: 4: Min assist Locomotion: Ambulation: 2: Travels 50 - 149 ft with minimal assistance (Pt.>75%)  Comprehension Comprehension Mode: Auditory Comprehension: 5-Follows basic conversation/direction: With no assist  Expression Expression Mode: Verbal Expression: 5-Expresses basic needs/ideas: With extra time/assistive device  Social Interaction Social Interaction: 7-Interacts appropriately with others - No medications needed.  Problem Solving Problem Solving: 5-Solves basic problems: With no assist  Memory Memory: 5-Recognizes or recalls 90% of the time/requires cueing < 10% of the time  Medical Problem List and Plan:  1. 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 DVT Prophylaxis/Anticoagulation: SCDs left lower extremity. Monitor for any signs of DVT  3. Pain Management: Percocet as needed. Monitor with increased mobility. Tolerable at present  4. Neuropsych: This patient is capable of making decisions on his/her own behalf.  5. Hypertension. Tenormin 50 mg daily, lisinopril 10 mg daily. Good control at present  6. Question right lower lobe possible pneumonia. Empiric Avelox initiated 06/21/2012. CXR showing improvement. Continue avelox thru 06/27/12. 7. Tobacco abuse. NicoDerm patch. Discuss cessation of nicotine products  8. Hyperlipidemia. Zocor/lovaza  9. CAD/PTCA.  Continue aspirin therapy. Patient denies any chest pain 10. Hypokalemia- continue supplement  LOS (Days) 3 A FACE TO FACE EVALUATION WAS PERFORMED  SWARTZ,ZACHARY T 06/25/2012, 8:11 AM

## 2012-06-25 NOTE — Progress Notes (Signed)
Social Work  Social Work Assessment and Plan  Patient Details  Name: Stephen Dean MRN: [id number] Date of Birth: 06-14-1929  Today's Date: 06/25/2012  Problem List:  Patient Active Problem List  Diagnosis  . S/P AKA (above knee amputation)   Past Medical History: History reviewed. No pertinent past medical history. Past Surgical History:  Past Surgical History  Procedure Date  . Hernia repair   . Cardiac surgery     Stent placement x 2  . Embolectomy 06/17/2012    Procedure: EMBOLECTOMY;  Surgeon: Fransisco Hertz, MD;  Location: [facility] OR;  Service: Vascular;  Laterality: Right;  Thrombectomy of right tibial arteries,exclusion of right popliteal aneurysm, femoral -popliteal artery bypass graft,four compartment fasciotomies  . Femoral-popliteal bypass graft 06/17/2012    Procedure: BYPASS GRAFT FEMORAL-POPLITEAL ARTERY;  Surgeon: Fransisco Hertz, MD;  Location: [facility] OR;  Service: Vascular;  Laterality: Right;  Using 6 mm x 80 cm propaten goretex graft  . Intraoperative arteriogram 06/17/2012    Procedure: INTRA OPERATIVE ARTERIOGRAM;  Surgeon: Fransisco Hertz, MD;  Location: [facility] OR;  Service: Vascular;  Laterality: Right;  . Fasciotomy 06/17/2012    Procedure: FASCIOTOMY;  Surgeon: Fransisco Hertz, MD;  Location: [facility] OR;  Service: Vascular;  Laterality: Right;  four compartment  fasciotomy  . Amputation 06/18/2012    Procedure: AMPUTATION ABOVE KNEE;  Surgeon: Pryor Ochoa, MD;  Location: [facility] OR;  Service: Vascular;  Laterality: Right;   Social History:  reports that he has been smoking Cigarettes.  He has been smoking about 1 pack per day. He does not have any smokeless tobacco history on file. He reports that he does not drink alcohol or use illicit drugs.  Family / Support Systems Marital Status: Married Patient Roles: Spouse;Parent Spouse/Significant Other: wife, Deondrea Markos @ [phone] Children: son, Alinda Money [facility]) and daughter, Luster Landsberg (lives next door) Anticipated Caregiver:  wife Ability/Limitations of Caregiver: supervision Caregiver Availability: 24/7 Family Dynamics: Pt describes wife and children as all very supportive.  Wife in relatively good health and able to assist  Social History Preferred language: English Religion:  Cultural Background: NA Education: HS Read: Yes Write: Yes Employment Status: Retired Date Retired/Disabled/Unemployed: approx 20 years Fish farm manager Issues: none Guardian/Conservator: none   Abuse/Neglect Physical Abuse: Denies Verbal Abuse: Denies Sexual Abuse: Denies Exploitation of patient/patient's resources: Denies Self-Neglect: Denies  Emotional Status Pt's affect, behavior adn adjustment status: Pt very pleasnat, oriented and talkative.  Speaks openly about the loss of his leg, however, admits he is still adjusting to this as the medical issues were so sudden and very little time to mentally prepard for AKA.  Pt feels he is coping "well...the best I can...don't have much choice".  Have discussed option of peer visits and follow up support group resources if desired.  Have also referred for neuropsych intervention to screen for depression. Recent Psychosocial Issues: None Pyschiatric History: None Substance Abuse History: None  Patient / Family Perceptions, Expectations & Goals Pt/Family understanding of illness & functional limitations: Pt and wife with very basic understanding that pt lost his leg due to an aneurysm in the leg.  Basic understanding of functional limitations and anticipated d/c needs. Premorbid pt/family roles/activities: Pt and wife were very independent and active PTA - no limitations Anticipated changes in roles/activities/participation: Wife may need to assume some caregiving roles/ duties until pt is more independent Pt/family expectations/goals: Pt hopes to reach some minimal level of independence - he is optimistic about doing this.  Community Resources [employer]:  None Premorbid Home Care/DME Agencies: None Transportation available at discharge: yes Resource referrals recommended: Neuropsychology;Support group (specify) (Amputee Group)  Discharge Planning Living Arrangements: Spouse/significant other Support Systems: Spouse/significant other;Children;Friends/neighbors;Church/faith community Type of Residence: Private residence Insurance Resources: Medicare;Private Insurance (specify) Administrator) Financial Resources: Social Security Financial Screen Referred: No Living Expenses: Own Money Management: Patient Do you have any problems obtaining your medications?: No Home Management: pt and wife Patient/Family Preliminary Plans: Pt plans to return home with his wife as primary support. Social Work Anticipated Follow Up Needs: HH/OP;Support Group Expected length of stay: ELOS 7 to 10 days  Clinical Impression Pt very pleasant, oriented and motivated for CIR therapies.  Has basic understanding of medical issues.  Denies any significant emotional distress, however, will monitor.  [name] 06/25/2012, 2:03 PM

## 2012-06-25 NOTE — Progress Notes (Signed)
Physical Therapy Session Note  Patient Details  Name: GREYSIN MEDLEN MRN: [id number] Date of Birth: [birth year]  Today's Date: 06/25/2012 Time: 4098-1191 Time Calculation (min): 43 min  Short Term Goals: Week 1:     Skilled Therapeutic Interventions/Progress Updates:    Ambulation x 55' with RW, min-guard assist. Progressive assist and cues needed as pt became fatigued and delayed requesting to sit and rest. Performed curb step up forwards (once) then backwards x 5 reps performed with mod assist progressing to min. Cues for sequencing and safety as pt tends to move RW up/down too quickly prior to having balance. Standing balance activity (watering plants) working on no upper extremity support performed with up to min assist. Negotiation of RW over carpeted surface with moving objects along cabinets. Practiced lifting and placing RW at various targets for single limb balance retraining to prep for moving walker up/down when doing steps.   Therapy Documentation Precautions:  Precautions Precautions: Fall Restrictions Weight Bearing Restrictions: No Pain: No c/o pain   See FIM for current functional status  Therapy/Group: Individual Therapy  Wilhemina Bonito 06/25/2012, 5:04 PM

## 2012-06-25 NOTE — Progress Notes (Signed)
Physical Therapy Session Note  Patient Details  Name: Stephen Dean MRN: [id number] Date of Birth: 10-Dec-1928  Today's Date: 06/25/2012  Session 1 Time: 4098-1191 Time Calculation (min): 58 min  Short Term Goals: Week 1:     Skilled Therapeutic Interventions/Progress Updates:  Wheelchair mobility over controlled and home environments >200'x2 with supervision. Stand pivot transfers to/from low couch with RW and supervision with verbal cues for set up of wheelchair and safety. Gait training 65' x2 with RW, min assist, with verbal cues to avoid getting too close to walker.  Seated rest break between bouts.    Standing dynamic balance activities with patient standing in RW at kitchen counter reaching into upper cupboards bringing items onto counter and placing back into cupboard with contact guard without loss of balance.  Worked with pt on stepping up onto and down from curb height step with RW and min A x 4 with verbal cues for safe positioning in walker.  Gait training on carpeted surface with RW forward and backward with min A for walker manipulation 10' and in side-stepping on carpeted surface with contact guard 10' x4 with seated rest break to improve coordination of movement during ambulation.  Pt required minimum cuing for safe movement.    Session 2 Today's Date: 06/25/2012 Time: 4782-9562 Time Calculation (min): 32 min  Skilled Therapeutic Interventions/Progress Updates: Wheelchair mobility for endurance >200' x2 with supervision.  Gait training with RW and supervision 150' with occasional verbal cue for safe proximity within walker.  Patient performed curb step training with RW and contact assist-min A step forward/backward x4 reps.  Armchair pushups from WC at RW x10 for LE strengthening.  Therapy Documentation Precautions:  Precautions Precautions: Fall Restrictions Weight Bearing Restrictions: No   Pain: Pain Assessment Pain Assessment: No/denies pain Pain Score:  0-No pain either session  See FIM for current functional status  Therapy/Group: Individual Therapy both sessions  Rexene Agent 06/25/2012, 11:53 AM

## 2012-06-25 NOTE — Progress Notes (Signed)
Occupational Therapy Session Note  Patient Details  Name: Stephen Dean MRN: [id number] Date of Birth: [birth year]  Today's Date: 06/25/2012 Time: 0700-0755 Time Calculation (min): 55 min  Short Term Goals: Week 1:  OT Short Term Goal 1 (Week 1): STGs=LTGs (short length of stay)  Skilled Therapeutic Interventions/Progress Updates:    Pt in bed upon arrival but agreeable to bathing and dressing this morning.  Pt transferred to w/c to sit at sink to brush teeth before ambulating with RW to shower for bathing with sit<>stand from tub bench.  Pt completed dressing with sit<>stand from EOB.  Pt continues to require min verbal cues for safety with transitional movements.  Focus on safety awareness, functional ambulation, activity tolerance, and dynamic standing balance.  Therapy Documentation Precautions:  Precautions Precautions: Fall Restrictions Weight Bearing Restrictions: No   Pain: Pain Assessment Pain Assessment: No/denies pain  See FIM for current functional status  Therapy/Group: Individual Therapy  Rich Brave 06/25/2012, 7:58 AM

## 2012-06-26 ENCOUNTER — Inpatient Hospital Stay (HOSPITAL_COMMUNITY): Payer: Medicare Other | Admitting: Occupational Therapy

## 2012-06-26 NOTE — Progress Notes (Signed)
Occupational Therapy Session Note  Patient Details  Name: GAURAV ROHLMAN MRN: [id number] Date of Birth: 1929-02-01  Today's Date: 06/26/2012 Time: 1400-1430 Time Calculation (min): 30 min  Short Term Goals: Week 1:  OT Short Term Goal 1 (Week 1): STGs=LTGs (short length of stay)  Skilled Therapeutic Interventions/Progress Updates:  Patient found seated in w/c with wife present in room. Patient propelled self -> ADL apartment for education to patient and wife regarding tub/shower transfers on/off tub transfer bench. Recommend patient use tub transfer bench at home for showering. Patient then propelled self from ADL apartment -> 1st floor for skilled treatment focusing on w/c propulsion & maneuvering and UE strengthening (outside environment on uneven surfaces). Patient propelled self back to room and therapist left patient seated in w/c in room with quick release belt donned. Wife present during entire session.   Precautions:  Precautions Precautions: Fall Restrictions Weight Bearing Restrictions: No  See FIM for current functional status  Therapy/Group: Individual Therapy  Zacchaeus Halm 06/26/2012, 2:39 PM

## 2012-06-26 NOTE — Progress Notes (Signed)
Subjective/Complaints: Happy with progress. Pain under reasonable control. No new complaints today. A 12 point review of systems has been performed and if not noted above is otherwise negative.   Objective: Vital Signs: Blood pressure 112/68, pulse 76, temperature 98.1 F (36.7 C), temperature source Oral, resp. rate 17, height 5\' 11"  (1.803 m), weight 74.8 kg (164 lb 14.5 oz), SpO2 94.00%. No results found. No results found for this basename: WBC:2,HGB:2,HCT:2,PLT:2 in the last 72 hours No results found for this basename: NA:2,K:2,CL:2,CO2:2,GLUCOSE:2,BUN:2,CREATININE:2,CALCIUM:2 in the last 72 hours CBG (last 3)  No results found for this basename: GLUCAP:3 in the last 72 hours  Wt Readings from Last 3 Encounters:  06/23/12 74.8 kg (164 lb 14.5 oz)  06/18/12 81.9 kg (180 lb 8.9 oz)  06/18/12 81.9 kg (180 lb 8.9 oz)    Physical Exam:  Constitutional: He is oriented to person, place, and time. He appears well-developed.  HENT:  Head: Normocephalic.  Eyes:  Pupils round and reactive to light  Neck: Neck supple. No thyromegaly present.  Cardiovascular: Normal rate and regular rhythm. No murmur  Pulmonary/Chest: Effort normal and breath sounds normal,  . He has no wheezes. No distress.  Abdominal: Bowel sounds are normal. There is no tenderness.  Neurological: He is alert and oriented to person, place, and time. Cn exam normal. Follows full commands. Strength 4/5 except for right AK. Able to flex right hip somewhat agst gravity. Sensory exam grossly intact  Skin:  Right AKA site is dressed with dry dressing and dry. Leg is appropriately tender.  Psychiatric: He has a normal mood and affect      Assessment/Plan: 1. Functional deficits secondary to right AKA which require 3+ hours per day of interdisciplinary therapy in a comprehensive inpatient rehab setting. Physiatrist is providing close team supervision and 24 hour management of active medical problems listed  below. Physiatrist and rehab team continue to assess barriers to discharge/monitor patient progress toward functional and medical goals. FIM: FIM - Bathing Bathing Steps Patient Completed: Chest;Right Arm;Left Arm;Abdomen;Front perineal area;Buttocks;Right upper leg;Left upper leg;Left lower leg (including foot);Right lower leg (including foot) Bathing: 5: Supervision: Safety issues/verbal cues (pt declined to bathe LB)  FIM - Upper Body Dressing/Undressing Upper body dressing/undressing steps patient completed: Thread/unthread right sleeve of pullover shirt/dresss;Thread/unthread left sleeve of pullover shirt/dress;Put head through opening of pull over shirt/dress;Pull shirt over trunk Upper body dressing/undressing: 6: More than reasonable amount of time FIM - Lower Body Dressing/Undressing Lower body dressing/undressing steps patient completed: Thread/unthread right underwear leg;Thread/unthread left underwear leg;Pull underwear up/down;Pull pants up/down;Thread/unthread left pants leg;Thread/unthread right pants leg;Don/Doff left sock;Don/Doff left shoe;Fasten/unfasten left shoe Lower body dressing/undressing: 5: Supervision: Safety issues/verbal cues  FIM - Toileting Toileting steps completed by patient: Adjust clothing prior to toileting;Performs perineal hygiene;Adjust clothing after toileting Toileting Assistive Devices: Grab bar or rail for support Toileting: 4: Steadying assist  FIM - Diplomatic Services operational officer Devices: Elevated toilet seat Toilet Transfers: 0-Activity did not occur  FIM - Banker Devices: Arm rests Bed/Chair Transfer: 5: Bed > Chair or W/C: Supervision (verbal cues/safety issues)  FIM - Locomotion: Wheelchair Distance: 150 Locomotion: Wheelchair: 5: Travels 150 ft or more: maneuvers on rugs and over door sills with supervision, cueing or coaxing FIM - Locomotion: Ambulation Locomotion: Ambulation  Assistive Devices: Designer, industrial/product Ambulation/Gait Assistance: 4: Min guard Locomotion: Ambulation: 2: Travels 50 - 149 ft with minimal assistance (Pt.>75%)  Comprehension Comprehension Mode: Auditory Comprehension: 5-Follows basic conversation/direction: With no assist  Expression  Expression Mode: Verbal Expression: 5-Expresses basic 90% of the time/requires cueing < 10% of the time.  Social Interaction Social Interaction: 7-Interacts appropriately with others - No medications needed.  Problem Solving Problem Solving: 5-Solves basic problems: With no assist  Memory Memory: 5-Recognizes or recalls 90% of the time/requires cueing < 10% of the time  Medical Problem List and Plan:  1. 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 DVT Prophylaxis/Anticoagulation: SCDs left lower extremity. Monitor for any signs of DVT  3. Pain Management: Percocet as needed. Monitor with increased mobility. Tolerable at present  4. Neuropsych: This patient is capable of making decisions on his/her own behalf.  5. Hypertension. Tenormin 50 mg daily, lisinopril 10 mg daily. Good control at present  6. Question right lower lobe possible pneumonia. Empiric Avelox initiated 06/21/2012. CXR showing improvement. Continue avelox thru 06/27/12. 7. Tobacco abuse. NicoDerm patch. Discuss cessation of nicotine products  8. Hyperlipidemia. Zocor/lovaza  9. CAD/PTCA. Continue aspirin therapy. Patient denies any chest pain 10. Hypokalemia- continue supplement  LOS (Days) 4 A FACE TO FACE EVALUATION WAS PERFORMED  Damen Windsor T 06/26/2012, 8:41 AM

## 2012-06-27 ENCOUNTER — Inpatient Hospital Stay (HOSPITAL_COMMUNITY): Payer: Medicare Other | Admitting: Physical Therapy

## 2012-06-27 NOTE — Progress Notes (Signed)
Physical Therapy Note  Patient Details  Name: ELIJAH HERRON MRN: [id number] Date of Birth: 10/02/1928 Today's Date: 06/27/2012  4332-9518  (45 minutes) individual Pain: 2/10 RT AKA/ premedicated Focus of treatment: RT AKA exercises; gait training/endurance Treatment: wc mobility: modified Independent unit 150 feet level, controlled environment ; transfers - squat/pivot SBA; RT AKA - hip flexion/extension, hip circles clockwise,counterclockwise; LT :LE heel slides, hip abduction, SAQs , bilateral hip extension over bolster (all X 20); standing reaching activities with vcs for reaching with appropriate UE to maintain balance (left); walker pickup for 5 seconds X 10: gait 50 feet RW min to close SBA; gait up/down curb RW min assist.   Moo Gravley,JIM 06/27/2012, 7:48 AM

## 2012-06-27 NOTE — Progress Notes (Signed)
Physical Therapy Session Note  Patient Details  Name: Stephen Dean MRN: [id number] Date of Birth: 1929-05-01  Today's Date: 06/27/2012 Time: 0814-4818 Time Calculation (min): 40 min   Simulated car transfer using squat pivot technique and gait up to car with RW; 2 repetitions each way. Pt requires cues for set up of w/c for safety and overall close S/steady A for transfers. W/c mobility off the unit and outdoors for training on uneven surfaces, up/down ramp, education on energy conservation, practiced managing various doors on unit from w/c, and furniture transfers from w/c onto couch all with S.     Therapy Documentation Precautions:  Precautions Precautions: Fall Restrictions Weight Bearing Restrictions: No   Pain:  no complaints.   Locomotion : Ambulation Ambulation/Gait Assistance: 4: Min guard   See FIM for current functional status  Therapy/Group: Individual Therapy  Karolee Stamps [facility] 06/27/2012, 2:44 PM

## 2012-06-27 NOTE — Progress Notes (Signed)
Occupational Therapy Session Note  Patient Details  Name: Stephen Dean MRN: [id number] Date of Birth: Jun 05, 1929  Today's Date: 06/27/2012 Time: 0730-0815 and 1300-1345=90total minutes Time Calculation (min): 45 min and     Skilled Therapeutic Interventions/Progress Updates: AM session:  ADL   Shower with focus on dynamic standing to dress LB.  (lateral leans on tub transfer bench for bathing)  Also focused on slowing down.  Good that the chair has anti tippers as patient leaned back in chair for donning and doffing pants and underwear and residual limb shrinker.  Patient moved at a fast pace for all activities but stated he was in fairly good physical condition and was not going to do anything to hurt himself.  PM session:    Standing endurance, fair dynamic standing balance using one hand and supporting self on walker or table with other hand for about 8 minutes per trial; wife and dtr present for pm session Therapy Documentation Precautions:  Precautions Precautions: Fall Restrictions Weight Bearing Restrictions: No  Pain: 3/10 soreness in residual limb     See FIM for current functional status  Therapy/Group: Individual Therapy  Bud Face [facility] 06/27/2012, 8:12 AM

## 2012-06-27 NOTE — Progress Notes (Signed)
Subjective/Complaints: Happy with progress. Up with OT already today. In good spirits A 12 point review of systems has been performed and if not noted above is otherwise negative.   Objective: Vital Signs: Blood pressure 129/67, pulse 82, temperature 97.5 F (36.4 C), temperature source Oral, resp. rate 17, height 5\' 11"  (1.803 m), weight 74.8 kg (164 lb 14.5 oz), SpO2 95.00%. No results found. No results found for this basename: WBC:2,HGB:2,HCT:2,PLT:2 in the last 72 hours No results found for this basename: NA:2,K:2,CL:2,CO2:2,GLUCOSE:2,BUN:2,CREATININE:2,CALCIUM:2 in the last 72 hours CBG (last 3)  No results found for this basename: GLUCAP:3 in the last 72 hours  Wt Readings from Last 3 Encounters:  06/23/12 74.8 kg (164 lb 14.5 oz)  06/18/12 81.9 kg (180 lb 8.9 oz)  06/18/12 81.9 kg (180 lb 8.9 oz)    Physical Exam:  Constitutional: He is oriented to person, place, and time. He appears well-developed.  HENT:  Head: Normocephalic.  Eyes:  Pupils round and reactive to light  Neck: Neck supple. No thyromegaly present.  Cardiovascular: Normal rate and regular rhythm. No murmur  Pulmonary/Chest: Effort normal and breath sounds normal,  . He has no wheezes. No distress.  Abdominal: Bowel sounds are normal. There is no tenderness.  Neurological: He is alert and oriented to person, place, and time. Cn exam normal. Follows full commands. Strength 4/5 except for right AK. Able to flex right hip somewhat agst gravity. Sensory exam grossly intact  Skin:  Right AKA site is dressed with dry dressing and dry. Leg is appropriately tender.  Psychiatric: He has a normal mood and affect      Assessment/Plan: 1. Functional deficits secondary to right AKA which require 3+ hours per day of interdisciplinary therapy in a comprehensive inpatient rehab setting. Physiatrist is providing close team supervision and 24 hour management of active medical problems listed below. Physiatrist and rehab  team continue to assess barriers to discharge/monitor patient progress toward functional and medical goals. FIM: FIM - Bathing Bathing Steps Patient Completed: Chest;Right Arm;Left Arm;Abdomen;Front perineal area;Buttocks;Right upper leg;Left lower leg (including foot);Left upper leg Bathing: 5: Supervision: Safety issues/verbal cues (lateral leans for buttocks; S to Mod I)  FIM - Upper Body Dressing/Undressing Upper body dressing/undressing steps patient completed: Thread/unthread right sleeve of pullover shirt/dresss;Thread/unthread left sleeve of pullover shirt/dress;Put head through opening of pull over shirt/dress;Pull shirt over trunk Upper body dressing/undressing: 6: More than reasonable amount of time FIM - Lower Body Dressing/Undressing Lower body dressing/undressing steps patient completed: Thread/unthread right underwear leg;Thread/unthread left underwear leg;Pull underwear up/down;Thread/unthread right pants leg;Thread/unthread left pants leg;Pull pants up/down;Fasten/unfasten pants;Don/Doff right sock;Don/Doff left sock Lower body dressing/undressing: 5: Supervision: Safety issues/verbal cues  FIM - Toileting Toileting steps completed by patient: Adjust clothing prior to toileting;Performs perineal hygiene;Adjust clothing after toileting Toileting Assistive Devices: Grab bar or rail for support Toileting: 0: Activity did not occur  FIM - Diplomatic Services operational officer Devices: Elevated toilet seat Toilet Transfers: 0-Activity did not occur  FIM - Banker Devices: Arm rests Bed/Chair Transfer: 7: Supine > Sit: No assist;5: Bed > Chair or W/C: Supervision (verbal cues/safety issues) (transferred self to chair from bed while clinician turned aw)  FIM - Locomotion: Wheelchair Distance: 150 Locomotion: Wheelchair: 5: Travels 150 ft or more: maneuvers on rugs and over door sills with supervision, cueing or coaxing FIM -  Locomotion: Ambulation Locomotion: Ambulation Assistive Devices: Designer, industrial/product Ambulation/Gait Assistance: 4: Min guard Locomotion: Ambulation: 2: Travels 50 - 149 ft with minimal assistance (  Pt.>75%)  Comprehension Comprehension Mode: Auditory Comprehension: 6-Follows complex conversation/direction: With extra time/assistive device  Expression Expression Mode: Verbal Expression: 7-Expresses complex ideas: With no assist  Social Interaction Social Interaction: 7-Interacts appropriately with others - No medications needed.  Problem Solving Problem Solving: 6-Solves complex problems: With extra time  Memory Memory: 7-Complete Independence: No helper  Medical Problem List and Plan:  1. 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 DVT Prophylaxis/Anticoagulation: SCDs left lower extremity. Monitor for any signs of DVT  3. Pain Management: Percocet as needed. Monitor with increased mobility. Tolerable at present  4. Neuropsych: This patient is capable of making decisions on his/her own behalf.  5. Hypertension. Tenormin 50 mg daily, lisinopril 10 mg daily. Good control at present  6. Question right lower lobe possible pneumonia. Empiric Avelox initiated 06/21/2012. CXR showing improvement. Continue avelox thru 06/27/12. 7. Tobacco abuse. NicoDerm patch. Discuss cessation of nicotine products  8. Hyperlipidemia. Zocor/lovaza  9. CAD/PTCA. Continue aspirin therapy. Patient denies any chest pain 10. Hypokalemia- continue supplement  LOS (Days) 5 A FACE TO FACE EVALUATION WAS PERFORMED  SWARTZ,ZACHARY T 06/27/2012, 8:10 AM

## 2012-06-28 ENCOUNTER — Inpatient Hospital Stay (HOSPITAL_COMMUNITY): Payer: Medicare Other | Admitting: Physical Therapy

## 2012-06-28 ENCOUNTER — Inpatient Hospital Stay (HOSPITAL_COMMUNITY): Payer: Medicare Other

## 2012-06-28 DIAGNOSIS — L98499 Non-pressure chronic ulcer of skin of other sites with unspecified severity: Secondary | ICD-10-CM

## 2012-06-28 DIAGNOSIS — I2510 Atherosclerotic heart disease of native coronary artery without angina pectoris: Secondary | ICD-10-CM

## 2012-06-28 DIAGNOSIS — S78119A Complete traumatic amputation at level between unspecified hip and knee, initial encounter: Secondary | ICD-10-CM

## 2012-06-28 DIAGNOSIS — I739 Peripheral vascular disease, unspecified: Secondary | ICD-10-CM

## 2012-06-28 NOTE — Consult Note (Signed)
  06/25/12  PSYCHOSOCIAL NOTE - CONFIDENTIAL  [facility]   Mr. Stephen Dean was seen on the [facility]. He is on the unit status post lower right leg amputation. He was referred for neuropsychological consultation to evaluate for memory and cognitive issues and to assess for depressive symptoms and provide coping strategies as necessary.   Mr. [name] reported being in good spirits since his surgeries regardless of the fact that it lead to his leg being amputated because of a ruptured aneurysm. The patient presently lives with his wife. He also has family that lives close to him with his overall support system being very good. He denied experiencing any cognitive or behavioral issues.   Upon interview, Mr. [name] denied suffering from any signs of significant psychopathology and noting unusual was observed. We reviewed basic coping strategies that he could utilize as needed. In addition, I encouraged his family to watch for any emotional crash once he left the hospital, as sometimes the effects of losing a limb can be processed at a later date and lead to increased depression beyond a normal adjustment reaction.    Plan: Follow-up with Mr. [name] as needed. Provide ego support and supportive psychotherapy as required. Encourage his family to look out for cognitive decline or increased emotional issues in the future.   Mr. [name] appeared to understand the results as presented and agreed to all recommendations. Greater than 50% of this visit was spent educating the patient about the possible diagnosis, prognosis, management plan, and in coordination of care.   REFERRING DIAGNOSIS: possible Memory lapses or loss  FINAL DIAGNOSIS:  Cognitive disorder, NOS  Total professional time including medical record review and feedback equals 4 units (81191).   Debbe Mounts, Psy.D.  Clinical Neuropsychologist

## 2012-06-28 NOTE — Progress Notes (Signed)
Occupational Therapy Session Note  Patient Details  Name: Stephen Dean MRN: [id number] Date of Birth: 07-15-1929  Today's Date: 06/28/2012  Session 1 Time: 0700-0745 Time Calculation (min): 45 min  Short Term Goals: Week 1:  OT Short Term Goal 1 (Week 1): STGs=LTGs (short length of stay)  Skilled Therapeutic Interventions/Progress Updates:    Pt engaged in bathing and dressing tasks w/c level at sink before rolling to gym for standing tasks with emphasis on standing balance while engaging in BUE tasks.  Pt stood 4 min X 3 with steady assist.  Pt continues to require min verbal cues for safety awareness during transitional movements.  Focus on w/c mobility, functional ambulation for home mgmt tasks, dynamic standing balance, activity tolerance, and safety awareness.  Therapy Documentation Precautions:  Precautions Precautions: Fall Restrictions Weight Bearing Restrictions: No   Pain: Pain Assessment Pain Assessment: No/denies pain  See FIM for current functional status  Therapy/Group: Individual Therapy  Session 2 Time: 1300-1400 Pt denies pain Individual Therapy Pt rolled to gym for BUE therex including exercises on paragym with focus on shoulders, upper back, triceps, and biceps.  Therapist demonstrated w/c pushups and pt return demonstrated and independently completed 3 X 12 with 30 secs rest.  Discussed equipment recommendations (Tub bench) and safety recommendations for home.  Pt rolled to room and remained in w/c with quick release belt in place.  Lavone Neri [facility] 06/28/2012, 2:01 PM

## 2012-06-28 NOTE — Discharge Summary (Signed)
  Discharge summary job [id number]

## 2012-06-28 NOTE — Progress Notes (Signed)
Physical Therapy Session Note  Patient Details  Name: Stephen Dean MRN: [id number] Date of Birth: [birth year]  Today's Date: 06/28/2012 Time: 0830-0926 Time Calculation (min): 56 min  Short Term Goals: Week 1:     Skilled Therapeutic Interventions/Progress Updates:    Wheelchair mobility controlled (>150') and home environment (60') modified independent. Home ambulation over carpet and tile surfaces, pt ambulating forwards, backwards, and side hopping with supervision. Controlled environment ambulation x 80' with supervision. Discussed steps to enter home again as pt now reports actually has 2 full steps to enter vs. The one step that he has been practicing. Attempted ascent of steps backwards with walker x 3 attempts each time pt either requiring max or +2 assist to prevent falling. Decided upon scooting up steps on buttocks. Pt able to perform floor transfers easily with step stool x 3 reps and scoot up/down steps all with supervision, unable to perform safely without step stool. Discussed installing rail for longer term, recommended pt not perform steps with rail until he can practice with HHPT.   Second Session Skilled Therapeutic Interventions/Progress Updates:  Time: 1133-1212  Time Calculation (min):  Wife present. Educated on pt's falls risk and need for supervision. Rest of session focused on steps, floor transfer, and what kind of step stool wife should buy. Pt performed wheelchair <> floor transfer (with step stool) and steps with supervision, wife educated on sequencing and safety. Reiterated need for pt to perform steps with rail with HHPT prior to performing with any family members.     Therapy Documentation Precautions:  Precautions Precautions: Fall Restrictions Weight Bearing Restrictions: No Pain: Pain Assessment Pain Assessment: No/denies pain either session  See FIM for current functional status  Therapy/Group: Individual Therapy both  sessions  Wilhemina Bonito 06/28/2012, 12:28 PM

## 2012-06-28 NOTE — Progress Notes (Signed)
Subjective/Complaints: Happy with progress. Up with OT already today again.  A 12 point review of systems has been performed and if not noted above is otherwise negative.   Objective: Vital Signs: Blood pressure 126/65, pulse 76, temperature 98.1 F (36.7 C), temperature source Oral, resp. rate 17, height 5\' 11"  (1.803 m), weight 74.8 kg (164 lb 14.5 oz), SpO2 96.00%. No results found. No results found for this basename: WBC:2,HGB:2,HCT:2,PLT:2 in the last 72 hours No results found for this basename: NA:2,K:2,CL:2,CO2:2,GLUCOSE:2,BUN:2,CREATININE:2,CALCIUM:2 in the last 72 hours CBG (last 3)  No results found for this basename: GLUCAP:3 in the last 72 hours  Wt Readings from Last 3 Encounters:  06/23/12 74.8 kg (164 lb 14.5 oz)  06/18/12 81.9 kg (180 lb 8.9 oz)  06/18/12 81.9 kg (180 lb 8.9 oz)    Physical Exam:  Constitutional: He is oriented to person, place, and time. He appears well-developed.  HENT:  Head: Normocephalic.  Eyes:  Pupils round and reactive to light  Neck: Neck supple. No thyromegaly present.  Cardiovascular: Normal rate and regular rhythm. No murmur  Pulmonary/Chest: Effort normal and breath sounds normal,  . He has no wheezes. No distress.  Abdominal: Bowel sounds are normal. There is no tenderness.  Neurological: He is alert and oriented to person, place, and time. Cn exam normal. Follows full commands. Strength 4/5 except for right AK. Able to flex right hip somewhat agst gravity. Sensory exam grossly intact  Skin:  Right AKA site is dressed with dry dressing and dry. Leg is appropriately tender.  Psychiatric: He has a normal mood and affect      Assessment/Plan: 1. Functional deficits secondary to right AKA which require 3+ hours per day of interdisciplinary therapy in a comprehensive inpatient rehab setting. Physiatrist is providing close team supervision and 24 hour management of active medical problems listed below. Physiatrist and rehab team  continue to assess barriers to discharge/monitor patient progress toward functional and medical goals.  Making nice progress. Should be ready to go tomorrow. FIM: FIM - Bathing Bathing Steps Patient Completed: Chest;Right Arm;Left Arm;Abdomen;Front perineal area;Buttocks;Right upper leg;Left lower leg (including foot);Left upper leg Bathing: 5: Supervision: Safety issues/verbal cues (lateral leans for buttocks; S to Mod I)  FIM - Upper Body Dressing/Undressing Upper body dressing/undressing steps patient completed: Thread/unthread right sleeve of pullover shirt/dresss;Thread/unthread left sleeve of pullover shirt/dress;Put head through opening of pull over shirt/dress;Pull shirt over trunk Upper body dressing/undressing: 6: More than reasonable amount of time FIM - Lower Body Dressing/Undressing Lower body dressing/undressing steps patient completed: Thread/unthread right underwear leg;Thread/unthread left underwear leg;Pull underwear up/down;Thread/unthread right pants leg;Thread/unthread left pants leg;Pull pants up/down;Fasten/unfasten pants;Don/Doff right sock;Don/Doff left sock Lower body dressing/undressing: 5: Supervision: Safety issues/verbal cues  FIM - Toileting Toileting steps completed by patient: Adjust clothing prior to toileting;Performs perineal hygiene;Adjust clothing after toileting Toileting Assistive Devices: Grab bar or rail for support Toileting: 4: Steadying assist  FIM - Diplomatic Services operational officer Devices: Elevated toilet seat Toilet Transfers: 4-To toilet/BSC: Min A (steadying Pt. > 75%);4-From toilet/BSC: Min A (steadying Pt. > 75%)  FIM - Bed/Chair Transfer Bed/Chair Transfer Assistive Devices: Arm rests Bed/Chair Transfer: 7: Supine > Sit: No assist;4: Bed > Chair or W/C: Min A (steadying Pt. > 75%);4: Chair or W/C > Bed: Min A (steadying Pt. > 75%)  FIM - Locomotion: Wheelchair Distance: 150 Locomotion: Wheelchair: 5: Travels 150 ft or more:  maneuvers on rugs and over door sills with supervision, cueing or coaxing FIM - Locomotion: Ambulation Locomotion: Ambulation  Assistive Devices: Designer, industrial/product Ambulation/Gait Assistance: 4: Min guard Locomotion: Ambulation: 1: Travels less than 50 ft with minimal assistance (Pt.>75%)  Comprehension Comprehension Mode: Auditory Comprehension: 5-Follows basic conversation/direction: With no assist  Expression Expression Mode: Verbal Expression: 5-Expresses basic 90% of the time/requires cueing < 10% of the time.  Social Interaction Social Interaction: 7-Interacts appropriately with others - No medications needed.  Problem Solving Problem Solving: 5-Solves basic problems: With no assist  Memory Memory: 5-Recognizes or recalls 90% of the time/requires cueing < 10% of the time  Medical Problem List and Plan:  1. 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 DVT Prophylaxis/Anticoagulation: SCDs left lower extremity. Monitor for any signs of DVT  3. Pain Management: Percocet as needed. Monitor with increased mobility. Tolerable at present  4. Neuropsych: This patient is capable of making decisions on his/her own behalf.  5. Hypertension. Tenormin 50 mg daily, lisinopril 10 mg daily. Good control at present  6. Question right lower lobe possible pneumonia. Empiric Avelox initiated 06/21/2012. CXR showing improvement. Continue avelox thru 06/27/12. 7. Tobacco abuse. NicoDerm patch. Discuss cessation of nicotine products  8. Hyperlipidemia. Zocor/lovaza  9. CAD/PTCA. Continue aspirin therapy. Patient denies any chest pain 10. Hypokalemia- continue supplement  LOS (Days) 6 A FACE TO FACE EVALUATION WAS PERFORMED  SWARTZ,ZACHARY T 06/28/2012, 7:40 AM

## 2012-06-28 NOTE — Discharge Summary (Signed)
NAMECARLISLE, ENKE             ACCOUNT NO.:  [id number]  MEDICAL RECORD NO.:  [id number]  LOCATION:  4140                         FACILITY:  [facility]  PHYSICIAN:  Ranelle Oyster, M.D.DATE OF BIRTH:  1929-07-09  DATE OF ADMISSION:  06/22/2012 DATE OF DISCHARGE:  06/29/2012                              DISCHARGE SUMMARY   DISCHARGE DIAGNOSES: 1. Right above-knee amputation secondary to thrombosed right popliteal     aneurysm. 2. Sequential compression device of left lower extremity for deep vein     thrombosis prophylaxis, pain management, hypertension, tobacco     abuse, hyperlipidemia, coronary artery disease with percutaneous     transluminal coronary angioplasty.  This is an 78 year old right-handed male admitted June 17, 2012, with ischemic right leg and increasing rest pain.  The patient had been very independent and active prior to admission.  The patient had initially attempted to treat this with rest and over the counter medications. Noted CPKs greater than 19,000 with findings of acute right foot drop. CT angiogram of the right lower extremity revealed thrombosed 4.5 cm proximal right popliteal artery aneurysm.  The patient underwent thrombectomy as well as right common femoral artery to below the knee popliteal artery bypass, June 17, 2012, per Dr. Imogene Burn.  Close monitoring of extremity with ongoing ischemic changes on limb was not felt to be salvageable and underwent right above-knee amputation June 18, 2012.  Postoperative pain management.  [employer] was consulted for preprosthetic equipment needs.  Placed on a NicoDerm patch for tobacco abuse.  Chest x-ray completed showing consolidation of right lower lobe possible pneumonia, placed on empiric Avelox.  The patient was admitted for comprehensive rehab program.  PAST MEDICAL HISTORY:  See discharge diagnoses.  SOCIAL HISTORY:  Lives with spouse, 1 level home, 1 step to entry.  Functional history  prior to admission was independent, retired. Functional status upon admission to rehab services was +2 total assist stand pivot transfers ambulating 10 feet with a rolling walker.  PHYSICAL EXAMINATION:  VITAL SIGNS:  Blood pressure 146/75, pulse 82, temperature 98.2, respirations 20. GENERAL:  This is an alert male, oriented x3. LUNGS:  Decreased breath sounds.  Clear to auscultation. CARDIAC:  Regular rate and rhythm. ABDOMEN:  Soft, nontender.  Good bowel sounds.  Right above-knee amputation site was dressed with a scant amount of serosanguineous drainage.  REHABILITATION HOSPITAL COURSE:  The patient was admitted to inpatient rehab services with therapies initiated on a 3-hour daily basis consisting of physical therapy, occupational therapy, and rehabilitation nursing.  The following issues were addressed during the patient's rehabilitation stay.  Pertaining to Mr. Maziarz right above-knee amputation secondary to thrombosed right popliteal aneurysm surgical site healing nicely after a surgical procedure.  He would follow up Dr. Imogene Burn of Vascular Surgery.  Pain management ongoing with the use of oxycodone in good results.  His blood pressures were well controlled on Tenormin as well as lisinopril with no orthostatic changes.  He did have a history of tobacco abuse.  He did receive bedside counseling in regards to abuse of nicotine products and the need for cessation of smoking.  It was questionable if he will be compliant with these requests.  He remained  on Zocor and Lovaza for hyperlipidemia.  Aspirin therapy for history of coronary artery disease with PTCA.  He completed a course of Avelox for empiric pneumonia, oxygen saturations greater than 90% on room air.  The patient received weekly collaborative interdisciplinary team conferences to discuss estimated length of stay, family teaching, and any barriers to discharge.  He was ambulating minimal assist with a walker needing  minimal assist for lower body activities of daily living.  His strength and endurance have greatly improved.  He was independent upper body activities of daily living and grooming.  Full family teaching was completed and plan will be discharged to home with ongoing therapies dictated as per rehab services.  DISCHARGE MEDICATIONS: 1. Percocet 5/325 one or two tablets every 4 hours as needed pain. 2. Aspirin 81 mg daily. 3. Atenolol 50 mg daily. 4. Lisinopril 10 mg daily. 5. NicoDerm patch 21 mg daily, taper as advised. 6. Lovaza 1 g b.i.d. 7. Protonix 40 mg daily. 8. Zocor 20 mg daily.  DIET:  Cardiac diet.  SPECIAL INSTRUCTIONS:  Follow with Dr. Imogene Burn, Vascular Surgery 2 weeks for removal of staples.  Call if any increased redness, drainage, or fever.  Follow up Dr. Harold Hedge at the outpatient rehab service office August 10, 2012.  Follow up primary MD for ongoing medical management.     Mariam Dollar, P.A.   ______________________________ Ranelle Oyster, M.D.    DA/MEDQ  D:  06/28/2012  T:  06/28/2012  Job:  [id number]  cc:   Fransisco Hertz, MD

## 2012-06-29 ENCOUNTER — Encounter (HOSPITAL_COMMUNITY): Payer: Medicare Other

## 2012-06-29 ENCOUNTER — Inpatient Hospital Stay (HOSPITAL_COMMUNITY): Payer: Medicare Other | Admitting: Physical Therapy

## 2012-06-29 DIAGNOSIS — S78119A Complete traumatic amputation at level between unspecified hip and knee, initial encounter: Secondary | ICD-10-CM

## 2012-06-29 DIAGNOSIS — I739 Peripheral vascular disease, unspecified: Secondary | ICD-10-CM

## 2012-06-29 DIAGNOSIS — L98499 Non-pressure chronic ulcer of skin of other sites with unspecified severity: Secondary | ICD-10-CM

## 2012-06-29 DIAGNOSIS — I2510 Atherosclerotic heart disease of native coronary artery without angina pectoris: Secondary | ICD-10-CM

## 2012-06-29 MED ORDER — NICOTINE 21 MG/24HR TD PT24: 14.0000 | MEDICATED_PATCH | Freq: Every day | TRANSDERMAL | Status: DC

## 2012-06-29 MED ORDER — OXYCODONE-ACETAMINOPHEN 5-325 MG PO TABS: 1.0000 | ORAL_TABLET | ORAL | Status: DC | PRN

## 2012-06-29 NOTE — Progress Notes (Signed)
Physical Therapy Discharge Summary  Patient Details  Name: Stephen Dean MRN: [id number] Date of Birth: [birth year]  Today's Date: 06/29/2012 Time: 1000-1100 Time Calculation (min): 60 min  Wife present for family education. She has purchased step aerobics blocks for floor transfer, discussed use and she feels comfortable with them. Educated wife on positioning if pt needs supervision for car transfer, wife and pt performed together well. Wife providing supervision for pt with gait on unit x 150' and in home environment x 60' over carpet in varying directions, no overt losses of balance. Wheelchair <> floor transfer and stair transfer (buttock scoot) x 2 reps. PT to set up wheelchair and step first attempt, wife and pt setting up second attempt. Pt performed floor transfer and steps with supervision. Educated wife and pt on wheelchair set up, use and adjustment of anti-tippers for bumping wheelchair up/down steps (wife performed), folding/openning wheelchair, transfer of wheelchair to car. Wife reports she has no further questions at this time. Stressed strongly importance of performing steps as has been practiced to decrease risk of falls, strongly stressed pt wait to practice any other method when HHPT at house to provide professional input and assistance to decrease risk of fall. Educated on risk and consequences of falls.   Patient has met 9 of 9 long term goals due to improved activity tolerance, improved balance, increased strength, decreased pain, ability to compensate for deficits and improved awareness.  Patient to discharge at an ambulatory level Supervision, modified independent for wheelchair mobility.   Patient's care partner is independent to provide the necessary physical assistance at discharge.  Reasons goals not met: NA  Recommendation:  Patient will benefit from ongoing skilled PT services in home health setting to continue to advance safe functional mobility, address ongoing  impairments in stair ascent/descent in standing position, dynamic balance, safety with use of RW around home environments with tendency to rush, and to minimize fall risk.  Equipment: wheelchair, RW  Reasons for discharge: treatment goals met and discharge from hospital  Patient/family agrees with progress made and goals achieved: Yes  PT Discharge Precautions/Restrictions Precautions Precautions: Fall Pain  no c/o pain  Cognition Overall Cognitive Status: Appears within functional limits for tasks assessed Arousal/Alertness: Awake/alert Orientation Level: Oriented X4 Comments: Has difficulty thinking fully through safety of certain aspects of mobility, will benefit from supervision with most standing mobility.  Sensation Sensation Light Touch: Appears Intact Stereognosis: Not tested Hot/Cold: Appears Intact Proprioception: Appears Intact Coordination Gross Motor Movements are Fluid and Coordinated: Yes Fine Motor Movements are Fluid and Coordinated: Yes  Mobility Bed Mobility Bed Mobility: Sit to Supine Supine to Sit: 6: Modified independent (Device/Increase time) Sit to Supine: 6: Modified independent (Device/Increase time) Transfers Sit to Stand: 6: Modified independent (Device/Increase time) Stand to Sit: 6: Modified independent (Device/Increase time) Stand Pivot Transfers: 6: Modified independent (Device/Increase time) Locomotion  Ambulation Ambulation: Yes Ambulation/Gait Assistance: 5: Supervision Ambulation Distance (Feet): 150 Feet Assistive device: Rolling walker Ambulation/Gait Assistance Details: Cues for safe speed High Level Ambulation High Level Ambulation: Side stepping;Backwards walking;Direction changes Side Stepping: supervision Backwards Walking: supervision Direction Changes: supervision Stairs / Additional Locomotion Stairs: Yes Stairs Assistance: 5: Supervision Stairs Assistance Details (indicate cue type and reason): Performed via floor  transfer then scoot up/down steps on buttocks. No assist or cues needed. Number of Stairs: 4  Height of Stairs: 6  (inch) Wheelchair Mobility Wheelchair Mobility: Yes Wheelchair Assistance: 6: Modified independent (Device/Increase time) Occupational hygienist: Both upper extremities Wheelchair Parts Management: Independent  Trunk/Postural Assessment  Cervical Assessment Cervical Assessment: Within Functional Limits Thoracic Assessment Thoracic Assessment: Within Functional Limits Lumbar Assessment Lumbar Assessment: Within Functional Limits Postural Control Postural Control: Within Functional Limits  Balance Static Sitting Balance Static Sitting - Level of Assistance: 7: Independent Dynamic Standing Balance Dynamic Standing - Balance Support: Bilateral upper extremity supported Dynamic Standing - Level of Assistance: 6: Modified independent (Device/Increase time) Extremity Assessment  RUE Assessment RUE Assessment: Within Functional Limits LUE Assessment LUE Assessment: Within Functional Limits RLE Assessment RLE Assessment: Exceptions to [facility] RLE Strength RLE Overall Strength Comments: for intact joints LLE Assessment LLE Assessment: Within Functional Limits  See FIM for current functional status  Wilhemina Bonito 06/29/2012, 6:03 PM

## 2012-06-29 NOTE — Progress Notes (Signed)
Social Work  Discharge Note  The overall goal for the admission was met for:   Discharge location: Yes - home with wife to assist  Length of Stay: Yes - supervision to minimal assistance overall  Discharge activity level: Yes  Home/community participation: Yes  Services provided included: MD, RD, PT, OT, RN, TR, Pharmacy and SW  Financial Services: Medicare and Private Insurance: Community education officer and [facility]  Follow-up services arranged: Home Health: RN, PT, OT via [facility], DME: 18x16 [place]ree[place] w/c with Basic cushion, rolling walker, tub bench via [facility], Other: provided information on local Amputee Support Group, Lif[employer] program and private duty agency list and Patient/Family has no preference for HH/DME agencies  Comments (or additional information):  Patient/Family verbalized understanding of follow-up arrangements: Yes  Individual responsible for coordination of the follow-up plan: pt and wife  Confirmed correct DME delivered: Amada Jupiter 06/29/2012    Santasia Rew

## 2012-06-29 NOTE — Progress Notes (Signed)
Occupational Therapy Session Note  Patient Details  Name: Stephen Dean MRN: [id number] Date of Birth: 1929/05/23  Today's Date: 06/29/2012 Time: 0900-0955 Time Calculation (min): 55 min  Short Term Goals: Week 1:  OT Short Term Goal 1 (Week 1): STGs=LTGs (short length of stay)  Skilled Therapeutic Interventions/Progress Updates:    Pt seated in w/c with wife present to participate in ADL retraining including bathing at tub/shower level and dressing with sit<>stand from tub seat.  Pt completed all tasks with supervision.  Discussed with patient and wife recommendation for 24 hour supervision.  Pt continues to demonstrate impulsiveness and requires occasional verbal cues for safety awareness.  Therapy Documentation Precautions:  Precautions Precautions: Fall Restrictions Weight Bearing Restrictions: No   Pain: Pain Assessment Pain Assessment: No/denies pain Pain Score: Asleep  See FIM for current functional status  Therapy/Group: Individual Therapy  Rich Brave 06/29/2012, 9:58 AM

## 2012-06-29 NOTE — Progress Notes (Signed)
Pt discharged to home at 1125 with wife. Discharge instructions given to family and pt. Equipment with family and belongings with family. No further questions at this time.

## 2012-06-29 NOTE — Progress Notes (Signed)
Subjective/Complaints: Excite to go home today!.  A 12 point review of systems has been performed and if not noted above is otherwise negative.   Objective: Vital Signs: Blood pressure 118/61, pulse 76, temperature 98.2 F (36.8 C), temperature source Oral, resp. rate 18, height 5\' 11"  (1.803 m), weight 74.8 kg (164 lb 14.5 oz), SpO2 94.00%. No results found. No results found for this basename: WBC:2,HGB:2,HCT:2,PLT:2 in the last 72 hours No results found for this basename: NA:2,K:2,CL:2,CO2:2,GLUCOSE:2,BUN:2,CREATININE:2,CALCIUM:2 in the last 72 hours CBG (last 3)  No results found for this basename: GLUCAP:3 in the last 72 hours  Wt Readings from Last 3 Encounters:  06/23/12 74.8 kg (164 lb 14.5 oz)  06/18/12 81.9 kg (180 lb 8.9 oz)  06/18/12 81.9 kg (180 lb 8.9 oz)    Physical Exam:  Constitutional: He is oriented to person, place, and time. He appears well-developed.  HENT:  Head: Normocephalic.  Eyes:  Pupils round and reactive to light  Neck: Neck supple. No thyromegaly present.  Cardiovascular: Normal rate and regular rhythm. No murmur  Pulmonary/Chest: Effort normal and breath sounds normal,  . He has no wheezes. No distress.  Abdominal: Bowel sounds are normal. There is no tenderness.  Neurological: He is alert and oriented to person, place, and time. Cn exam normal. Follows full commands. Strength 4/5 except for right AK. Able to flex right hip somewhat agst gravity. Sensory exam grossly intact  Skin:  Right AKA site is clean and dry. Leg is appropriately tender.  Psychiatric: He has a normal mood and affect      Assessment/Plan: 1. Functional deficits secondary to right AKA which require 3+ hours per day of interdisciplinary therapy in a comprehensive inpatient rehab setting. Physiatrist is providing close team supervision and 24 hour management of active medical problems listed below. Physiatrist and rehab team continue to assess barriers to discharge/monitor  patient progress toward functional and medical goals.  Home today!   FIM: FIM - Bathing Bathing Steps Patient Completed: Chest;Right Arm;Left Arm;Abdomen;Front perineal area;Buttocks;Right upper leg;Left upper leg;Left lower leg (including foot) Bathing: 6: Assistive device (Comment)  FIM - Upper Body Dressing/Undressing Upper body dressing/undressing steps patient completed: Thread/unthread right sleeve of pullover shirt/dresss;Thread/unthread left sleeve of pullover shirt/dress;Put head through opening of pull over shirt/dress;Pull shirt over trunk Upper body dressing/undressing: 7: Complete Independence: No helper FIM - Lower Body Dressing/Undressing Lower body dressing/undressing steps patient completed: Thread/unthread right underwear leg;Thread/unthread left underwear leg;Pull underwear up/down;Pull pants up/down;Thread/unthread left pants leg;Thread/unthread right pants leg;Don/Doff left sock;Don/Doff left shoe;Fasten/unfasten left shoe Lower body dressing/undressing: 5: Supervision: Safety issues/verbal cues  FIM - Toileting Toileting steps completed by patient: Adjust clothing prior to toileting;Performs perineal hygiene;Adjust clothing after toileting Toileting Assistive Devices: Grab bar or rail for support Toileting: 5: Supervision: Safety issues/verbal cues  FIM - Diplomatic Services operational officer Devices: Elevated toilet seat;Bedside commode Toilet Transfers: 5-To toilet/BSC: Supervision (verbal cues/safety issues);5-From toilet/BSC: Supervision (verbal cues/safety issues)  FIM - Press photographer Assistive Devices: Arm rests Bed/Chair Transfer: 7: Supine > Sit: No assist;4: Bed > Chair or W/C: Min A (steadying Pt. > 75%);4: Chair or W/C > Bed: Min A (steadying Pt. > 75%)  FIM - Locomotion: Wheelchair Distance: 150 Locomotion: Wheelchair: 5: Travels 150 ft or more: maneuvers on rugs and over door sills with supervision, cueing or  coaxing FIM - Locomotion: Ambulation Locomotion: Ambulation Assistive Devices: Designer, industrial/product Ambulation/Gait Assistance: 4: Min guard Locomotion: Ambulation: 1: Travels less than 50 ft with minimal assistance (Pt.>75%)  Comprehension Comprehension Mode: Auditory Comprehension: 5-Follows basic conversation/direction: With no assist  Expression Expression Mode: Verbal Expression: 5-Expresses basic 90% of the time/requires cueing < 10% of the time.  Social Interaction Social Interaction: 7-Interacts appropriately with others - No medications needed.  Problem Solving Problem Solving: 5-Solves basic problems: With no assist  Memory Memory: 5-Recognizes or recalls 90% of the time/requires cueing < 10% of the time  Medical Problem List and Plan:  1. 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 DVT Prophylaxis/Anticoagulation: SCDs left lower extremity. Monitor for any signs of DVT  3. Pain Management: Percocet as needed. Monitor with increased mobility. Tolerable at present  4. Neuropsych: This patient is capable of making decisions on his/her own behalf.  5. Hypertension. Tenormin 50 mg daily, lisinopril 10 mg daily. Good control at present  6. Question right lower lobe possible pneumonia. Empiric Avelox initiated 06/21/2012. CXR showing improvement. avelox completed. Continue activity, IS, etc 7. Tobacco abuse. NicoDerm patch. Discuss cessation of nicotine products  8. Hyperlipidemia. Zocor/lovaza  9. CAD/PTCA. Continue aspirin therapy. Patient denies any chest pain 10. Hypokalemia- continue supplement  LOS (Days) 7 A FACE TO FACE EVALUATION WAS PERFORMED  Endora Teresi T 06/29/2012, 8:22 AM

## 2012-06-29 NOTE — Patient Care Conference (Signed)
Inpatient RehabilitationTeam Conference Note Date: 06/29/2012   Time: 2:45 PM    Patient Name: Stephen Dean      Medical Record Number: [id number]  Date of Birth: Feb 16, 1929 Sex: Male         Room/Bed: 4140/4140-01 Payor Info: Payor: Advertising copywriter  Plan: [employer]  Product Type: *No Product type*     Admitting Diagnosis: RT AKA  Admit Date/Time:  06/22/2012  3:38 PM Admission Comments: No comment available   Primary Diagnosis:  S/P AKA (above knee amputation) Principal Problem: S/P AKA (above knee amputation)  Patient Active Problem List   Diagnosis Date Noted  . S/P AKA (above knee amputation) 06/22/2012    Expected Discharge Date: Expected Discharge Date: 06/29/12  Team Members Present: Physician: Dr. Faith Rogue Social Worker Present: Amada Jupiter, LCSW Nurse Present: Other (comment) Kennon Portela RN) PT Present: Reggy Eye, PT OT Present: Mackie Pai, Marye Round, OT;Ardis Rowan, COTA Other (Discipline and Name): Tora Duck, PPS Coordinator     Current Status/Progress Goal Weekly Team Focus  Medical   s/p left AKA. pnemonia  clearance of pneumonia, decreased pain  completion of abx, resolution of pneumonia, wound and pain mgt   Bowel/Bladder   pt continent of bowel and bladder         Swallow/Nutrition/ Hydration             ADL's   supervision overall  supervision overall  family education   Mobility             Communication             Safety/Cognition/ Behavioral Observations            Pain   no complaints of pain         Skin   staples to right AKA with no drainage present.  Shinker in place. incision to right groin clean and dry. Dermabond intact  free of infection and skin breakdown  monitor skin q shift and change AKA dressing as ordered or prn    Rehab Goals Patient on target to meet rehab goals: Yes *See Interdisciplinary Assessment and Plan and progress notes for long and short-term goals  Barriers to  Discharge: none    Possible Resolutions to Barriers:  n/a    Discharge Planning/Teaching Needs:  home with wife to provide any needed assistance      Team Discussion:  Has made good gains and ready for d/c  Revisions to Treatment Plan:  None   Continued Need for Acute Rehabilitation Level of Care: The patient requires daily medical management by a physician with specialized training in physical medicine and rehabilitation for the following conditions: Daily direction of a multidisciplinary physical rehabilitation program to ensure safe treatment while eliciting the highest outcome that is of practical value to the patient.: Yes Daily analysis of laboratory values and/or radiology reports with any subsequent need for medication adjustment of medical intervention for : Other;Post surgical problems;Pulmonary problems  Stephen Dean 06/29/2012, 4:26 PM

## 2012-06-30 DIAGNOSIS — Z48812 Encounter for surgical aftercare following surgery on the circulatory system: Secondary | ICD-10-CM | POA: Diagnosis not present

## 2012-06-30 DIAGNOSIS — R269 Unspecified abnormalities of gait and mobility: Secondary | ICD-10-CM | POA: Diagnosis not present

## 2012-06-30 DIAGNOSIS — G8918 Other acute postprocedural pain: Secondary | ICD-10-CM | POA: Diagnosis not present

## 2012-06-30 DIAGNOSIS — M6281 Muscle weakness (generalized): Secondary | ICD-10-CM | POA: Diagnosis not present

## 2012-06-30 DIAGNOSIS — I2510 Atherosclerotic heart disease of native coronary artery without angina pectoris: Secondary | ICD-10-CM | POA: Diagnosis not present

## 2012-06-30 DIAGNOSIS — I10 Essential (primary) hypertension: Secondary | ICD-10-CM | POA: Diagnosis not present

## 2012-07-01 DIAGNOSIS — M6281 Muscle weakness (generalized): Secondary | ICD-10-CM | POA: Diagnosis not present

## 2012-07-01 DIAGNOSIS — G8918 Other acute postprocedural pain: Secondary | ICD-10-CM | POA: Diagnosis not present

## 2012-07-01 DIAGNOSIS — Z48812 Encounter for surgical aftercare following surgery on the circulatory system: Secondary | ICD-10-CM | POA: Diagnosis not present

## 2012-07-01 DIAGNOSIS — I2510 Atherosclerotic heart disease of native coronary artery without angina pectoris: Secondary | ICD-10-CM | POA: Diagnosis not present

## 2012-07-01 DIAGNOSIS — R269 Unspecified abnormalities of gait and mobility: Secondary | ICD-10-CM | POA: Diagnosis not present

## 2012-07-01 DIAGNOSIS — I10 Essential (primary) hypertension: Secondary | ICD-10-CM | POA: Diagnosis not present

## 2012-07-02 DIAGNOSIS — R269 Unspecified abnormalities of gait and mobility: Secondary | ICD-10-CM | POA: Diagnosis not present

## 2012-07-02 DIAGNOSIS — G8918 Other acute postprocedural pain: Secondary | ICD-10-CM | POA: Diagnosis not present

## 2012-07-02 DIAGNOSIS — I10 Essential (primary) hypertension: Secondary | ICD-10-CM | POA: Diagnosis not present

## 2012-07-02 DIAGNOSIS — Z48812 Encounter for surgical aftercare following surgery on the circulatory system: Secondary | ICD-10-CM | POA: Diagnosis not present

## 2012-07-02 DIAGNOSIS — I2510 Atherosclerotic heart disease of native coronary artery without angina pectoris: Secondary | ICD-10-CM | POA: Diagnosis not present

## 2012-07-02 DIAGNOSIS — M6281 Muscle weakness (generalized): Secondary | ICD-10-CM | POA: Diagnosis not present

## 2012-07-05 DIAGNOSIS — Z48812 Encounter for surgical aftercare following surgery on the circulatory system: Secondary | ICD-10-CM | POA: Diagnosis not present

## 2012-07-05 DIAGNOSIS — M6281 Muscle weakness (generalized): Secondary | ICD-10-CM | POA: Diagnosis not present

## 2012-07-05 DIAGNOSIS — R269 Unspecified abnormalities of gait and mobility: Secondary | ICD-10-CM | POA: Diagnosis not present

## 2012-07-05 DIAGNOSIS — I2510 Atherosclerotic heart disease of native coronary artery without angina pectoris: Secondary | ICD-10-CM | POA: Diagnosis not present

## 2012-07-05 DIAGNOSIS — I10 Essential (primary) hypertension: Secondary | ICD-10-CM | POA: Diagnosis not present

## 2012-07-05 DIAGNOSIS — G8918 Other acute postprocedural pain: Secondary | ICD-10-CM | POA: Diagnosis not present

## 2012-07-06 DIAGNOSIS — G8918 Other acute postprocedural pain: Secondary | ICD-10-CM | POA: Diagnosis not present

## 2012-07-06 DIAGNOSIS — I2510 Atherosclerotic heart disease of native coronary artery without angina pectoris: Secondary | ICD-10-CM | POA: Diagnosis not present

## 2012-07-06 DIAGNOSIS — I10 Essential (primary) hypertension: Secondary | ICD-10-CM | POA: Diagnosis not present

## 2012-07-06 DIAGNOSIS — R269 Unspecified abnormalities of gait and mobility: Secondary | ICD-10-CM | POA: Diagnosis not present

## 2012-07-06 DIAGNOSIS — Z48812 Encounter for surgical aftercare following surgery on the circulatory system: Secondary | ICD-10-CM | POA: Diagnosis not present

## 2012-07-06 DIAGNOSIS — M6281 Muscle weakness (generalized): Secondary | ICD-10-CM | POA: Diagnosis not present

## 2012-07-07 DIAGNOSIS — Z48812 Encounter for surgical aftercare following surgery on the circulatory system: Secondary | ICD-10-CM | POA: Diagnosis not present

## 2012-07-07 DIAGNOSIS — M6281 Muscle weakness (generalized): Secondary | ICD-10-CM | POA: Diagnosis not present

## 2012-07-07 DIAGNOSIS — G8918 Other acute postprocedural pain: Secondary | ICD-10-CM | POA: Diagnosis not present

## 2012-07-07 DIAGNOSIS — R269 Unspecified abnormalities of gait and mobility: Secondary | ICD-10-CM | POA: Diagnosis not present

## 2012-07-07 DIAGNOSIS — I10 Essential (primary) hypertension: Secondary | ICD-10-CM | POA: Diagnosis not present

## 2012-07-07 DIAGNOSIS — I2510 Atherosclerotic heart disease of native coronary artery without angina pectoris: Secondary | ICD-10-CM | POA: Diagnosis not present

## 2012-07-09 DIAGNOSIS — R269 Unspecified abnormalities of gait and mobility: Secondary | ICD-10-CM | POA: Diagnosis not present

## 2012-07-09 DIAGNOSIS — M6281 Muscle weakness (generalized): Secondary | ICD-10-CM | POA: Diagnosis not present

## 2012-07-09 DIAGNOSIS — I2510 Atherosclerotic heart disease of native coronary artery without angina pectoris: Secondary | ICD-10-CM | POA: Diagnosis not present

## 2012-07-09 DIAGNOSIS — G8918 Other acute postprocedural pain: Secondary | ICD-10-CM | POA: Diagnosis not present

## 2012-07-09 DIAGNOSIS — I10 Essential (primary) hypertension: Secondary | ICD-10-CM | POA: Diagnosis not present

## 2012-07-09 DIAGNOSIS — Z48812 Encounter for surgical aftercare following surgery on the circulatory system: Secondary | ICD-10-CM | POA: Diagnosis not present

## 2012-07-12 ENCOUNTER — Encounter: Payer: Self-pay | Admitting: Vascular Surgery

## 2012-07-12 DIAGNOSIS — R269 Unspecified abnormalities of gait and mobility: Secondary | ICD-10-CM | POA: Diagnosis not present

## 2012-07-12 DIAGNOSIS — Z48812 Encounter for surgical aftercare following surgery on the circulatory system: Secondary | ICD-10-CM | POA: Diagnosis not present

## 2012-07-12 DIAGNOSIS — F17200 Nicotine dependence, unspecified, uncomplicated: Secondary | ICD-10-CM | POA: Diagnosis not present

## 2012-07-12 DIAGNOSIS — I10 Essential (primary) hypertension: Secondary | ICD-10-CM | POA: Diagnosis not present

## 2012-07-12 DIAGNOSIS — I2510 Atherosclerotic heart disease of native coronary artery without angina pectoris: Secondary | ICD-10-CM | POA: Diagnosis not present

## 2012-07-12 DIAGNOSIS — G8918 Other acute postprocedural pain: Secondary | ICD-10-CM | POA: Diagnosis not present

## 2012-07-12 DIAGNOSIS — I739 Peripheral vascular disease, unspecified: Secondary | ICD-10-CM | POA: Diagnosis not present

## 2012-07-12 DIAGNOSIS — M6281 Muscle weakness (generalized): Secondary | ICD-10-CM | POA: Diagnosis not present

## 2012-07-12 DIAGNOSIS — E7800 Pure hypercholesterolemia, unspecified: Secondary | ICD-10-CM | POA: Diagnosis not present

## 2012-07-13 ENCOUNTER — Encounter: Payer: Self-pay | Admitting: Vascular Surgery

## 2012-07-13 ENCOUNTER — Ambulatory Visit (INDEPENDENT_AMBULATORY_CARE_PROVIDER_SITE_OTHER): Payer: 59 | Admitting: Vascular Surgery

## 2012-07-13 VITALS — BP 104/68 | HR 70 | Resp 18 | Ht 71.0 in | Wt 170.0 lb

## 2012-07-13 DIAGNOSIS — I739 Peripheral vascular disease, unspecified: Secondary | ICD-10-CM

## 2012-07-13 NOTE — Progress Notes (Signed)
Subjective:     Patient ID: Stephen Dean, male   DOB: Mar 06, 1929, 78 y.o.   MRN: [id number]  HPI this 78 year old male returns for followup regarding his right above-knee amputation I performed 3 weeks ago. Patient had been previously operated on by Dr. Imogene Burn. Patient denies any pain in the amputation site but does complain of phantom pain in the right lower extremity. He did spend time in the rehabilitation unit following the amputation and is receiving outpatient physical therapy currently Review of Systems     Objective:   Physical ExamBP 104/68  Pulse 70  Resp 18  Ht 5\' 11"  (1.803 m)  Wt 170 lb (77.111 kg)  BMI 23.71 kg/m2  General well-developed well-nourished male in no apparent distress Right AKA has healed nicely with minimal erythema medially probably staple reaction. No drainage.    Assessment:     #1 right AKA healing nicely now 3 weeks postop Skin staple removal today     Plan:     Patient will follow up with Dr. Imogene Burn as scheduled on Friday, December 6

## 2012-07-14 DIAGNOSIS — M6281 Muscle weakness (generalized): Secondary | ICD-10-CM | POA: Diagnosis not present

## 2012-07-14 DIAGNOSIS — R269 Unspecified abnormalities of gait and mobility: Secondary | ICD-10-CM | POA: Diagnosis not present

## 2012-07-14 DIAGNOSIS — I2510 Atherosclerotic heart disease of native coronary artery without angina pectoris: Secondary | ICD-10-CM | POA: Diagnosis not present

## 2012-07-14 DIAGNOSIS — G8918 Other acute postprocedural pain: Secondary | ICD-10-CM | POA: Diagnosis not present

## 2012-07-14 DIAGNOSIS — Z48812 Encounter for surgical aftercare following surgery on the circulatory system: Secondary | ICD-10-CM | POA: Diagnosis not present

## 2012-07-14 DIAGNOSIS — I10 Essential (primary) hypertension: Secondary | ICD-10-CM | POA: Diagnosis not present

## 2012-07-15 DIAGNOSIS — M6281 Muscle weakness (generalized): Secondary | ICD-10-CM | POA: Diagnosis not present

## 2012-07-15 DIAGNOSIS — I2510 Atherosclerotic heart disease of native coronary artery without angina pectoris: Secondary | ICD-10-CM | POA: Diagnosis not present

## 2012-07-15 DIAGNOSIS — I10 Essential (primary) hypertension: Secondary | ICD-10-CM | POA: Diagnosis not present

## 2012-07-15 DIAGNOSIS — G8918 Other acute postprocedural pain: Secondary | ICD-10-CM | POA: Diagnosis not present

## 2012-07-15 DIAGNOSIS — R269 Unspecified abnormalities of gait and mobility: Secondary | ICD-10-CM | POA: Diagnosis not present

## 2012-07-15 DIAGNOSIS — Z48812 Encounter for surgical aftercare following surgery on the circulatory system: Secondary | ICD-10-CM | POA: Diagnosis not present

## 2012-07-16 DIAGNOSIS — G8918 Other acute postprocedural pain: Secondary | ICD-10-CM | POA: Diagnosis not present

## 2012-07-16 DIAGNOSIS — I10 Essential (primary) hypertension: Secondary | ICD-10-CM | POA: Diagnosis not present

## 2012-07-16 DIAGNOSIS — Z48812 Encounter for surgical aftercare following surgery on the circulatory system: Secondary | ICD-10-CM | POA: Diagnosis not present

## 2012-07-16 DIAGNOSIS — R269 Unspecified abnormalities of gait and mobility: Secondary | ICD-10-CM | POA: Diagnosis not present

## 2012-07-16 DIAGNOSIS — M6281 Muscle weakness (generalized): Secondary | ICD-10-CM | POA: Diagnosis not present

## 2012-07-16 DIAGNOSIS — I2510 Atherosclerotic heart disease of native coronary artery without angina pectoris: Secondary | ICD-10-CM | POA: Diagnosis not present

## 2012-07-20 DIAGNOSIS — I10 Essential (primary) hypertension: Secondary | ICD-10-CM | POA: Diagnosis not present

## 2012-07-20 DIAGNOSIS — R269 Unspecified abnormalities of gait and mobility: Secondary | ICD-10-CM | POA: Diagnosis not present

## 2012-07-20 DIAGNOSIS — G8918 Other acute postprocedural pain: Secondary | ICD-10-CM | POA: Diagnosis not present

## 2012-07-20 DIAGNOSIS — Z48812 Encounter for surgical aftercare following surgery on the circulatory system: Secondary | ICD-10-CM | POA: Diagnosis not present

## 2012-07-20 DIAGNOSIS — M6281 Muscle weakness (generalized): Secondary | ICD-10-CM | POA: Diagnosis not present

## 2012-07-20 DIAGNOSIS — I2510 Atherosclerotic heart disease of native coronary artery without angina pectoris: Secondary | ICD-10-CM | POA: Diagnosis not present

## 2012-07-22 DIAGNOSIS — I2510 Atherosclerotic heart disease of native coronary artery without angina pectoris: Secondary | ICD-10-CM | POA: Diagnosis not present

## 2012-07-22 DIAGNOSIS — R269 Unspecified abnormalities of gait and mobility: Secondary | ICD-10-CM | POA: Diagnosis not present

## 2012-07-22 DIAGNOSIS — M6281 Muscle weakness (generalized): Secondary | ICD-10-CM | POA: Diagnosis not present

## 2012-07-22 DIAGNOSIS — G8918 Other acute postprocedural pain: Secondary | ICD-10-CM | POA: Diagnosis not present

## 2012-07-22 DIAGNOSIS — I10 Essential (primary) hypertension: Secondary | ICD-10-CM | POA: Diagnosis not present

## 2012-07-22 DIAGNOSIS — Z48812 Encounter for surgical aftercare following surgery on the circulatory system: Secondary | ICD-10-CM | POA: Diagnosis not present

## 2012-07-26 DIAGNOSIS — Z48812 Encounter for surgical aftercare following surgery on the circulatory system: Secondary | ICD-10-CM | POA: Diagnosis not present

## 2012-07-26 DIAGNOSIS — R269 Unspecified abnormalities of gait and mobility: Secondary | ICD-10-CM | POA: Diagnosis not present

## 2012-07-26 DIAGNOSIS — I2510 Atherosclerotic heart disease of native coronary artery without angina pectoris: Secondary | ICD-10-CM | POA: Diagnosis not present

## 2012-07-26 DIAGNOSIS — I10 Essential (primary) hypertension: Secondary | ICD-10-CM | POA: Diagnosis not present

## 2012-07-26 DIAGNOSIS — M6281 Muscle weakness (generalized): Secondary | ICD-10-CM | POA: Diagnosis not present

## 2012-07-26 DIAGNOSIS — G8918 Other acute postprocedural pain: Secondary | ICD-10-CM | POA: Diagnosis not present

## 2012-07-29 ENCOUNTER — Encounter: Payer: Self-pay | Admitting: Vascular Surgery

## 2012-07-30 ENCOUNTER — Encounter: Payer: Self-pay | Admitting: Vascular Surgery

## 2012-07-30 ENCOUNTER — Ambulatory Visit (INDEPENDENT_AMBULATORY_CARE_PROVIDER_SITE_OTHER): Payer: 59 | Admitting: Vascular Surgery

## 2012-07-30 VITALS — BP 116/64 | HR 71 | Temp 97.7°F | Ht 71.0 in | Wt 170.0 lb

## 2012-07-30 DIAGNOSIS — I724 Aneurysm of artery of lower extremity: Secondary | ICD-10-CM | POA: Insufficient documentation

## 2012-07-30 DIAGNOSIS — I739 Peripheral vascular disease, unspecified: Secondary | ICD-10-CM

## 2012-07-30 NOTE — Progress Notes (Signed)
[facility] POST OPERATIVE OFFICE NOTE  CC:  F/u for surgery  HPI:  This is a 78 y.o. male who is s/p right AKA after revascularization.  His entire calf muscle was ischemic and therefore underwent right AKA.  He has done well with rehabilitation.  He states that he has 3 sores on his stump with bloody drainage.  He has been afebrile.  No Known Allergies  Current Outpatient Prescriptions  Medication Sig Dispense Refill  . acetaminophen (TYLENOL) 325 MG tablet Take 650 mg by mouth every 6 (six) hours as needed.      Marland Kitchen aspirin 81 MG chewable tablet Chew 81 mg by mouth daily.      Marland Kitchen atenolol (TENORMIN) 50 MG tablet Take 50 mg by mouth daily.      . fosinopril (MONOPRIL) 10 MG tablet Take 10 mg by mouth daily.      . Multiple Vitamins-Minerals (CENTURY SENIOR PO) Take 1 tablet by mouth daily.      . nicotine (NICODERM CQ - DOSED IN MG/24 HOURS) 21 mg/24hr patch Place 14 patches onto the skin daily.  28 patch  0  . omega-3 acid ethyl esters (LOVAZA) 1 G capsule Take 1 g by mouth 2 (two) times daily.      Marland Kitchen oxyCODONE-acetaminophen (PERCOCET/ROXICET) 5-325 MG per tablet Take 1-2 tablets by mouth every 4 (four) hours as needed.  90 tablet  0  . pravastatin (PRAVACHOL) 40 MG tablet Take 80 mg by mouth daily.      . vitamin C (ASCORBIC ACID) 500 MG tablet Take 250 mg by mouth daily.      . vitamin E 400 UNIT capsule Take 400 Units by mouth daily.         ROS:  See HPI  Physical Exam:  Filed Vitals:   07/30/12 1143  BP: 116/64  Pulse: 71  Temp: 97.7 F (36.5 C)    Incision:  There is a medial sore with a scab that has erythema around it.  There is also a lateral sore and one in the meddle of the incision.  These are without erythema.  All are superficial and do not tract. Extremities:  Good movement of right AKA stump   A/P:  This is a 78 y.o. male here for f/u to right AKA.  He does have 3 sores on his incision, which is minimal fat necrosis and these are superficial  and do not track.   He is advised to clean the wounds bid and then use neosporin to wounds. He is given an Rx for Cipro 500 mg po bid x 10 days. He will f/u in 4 weeks (pt has other appts and the holidays are near).  He and his family know to call if these sores worsen, has increased drainage or is febrile.  Doreatha Massed, PA-C [facility] [phone]  Clinic MD:  Seen and examined with Dr. Imogene Burn  Addendum  I have independently interviewed and examined the patient, and I agree with the physician assistant's findings.  R AKA incision well healed excepted for three area of superficial necrosis.   I&D of all 3 areas completed with minimal pus draining.  Wound packed with antibiotic ointment and sterile dressings.  Pt given abx as detailed above.  LWC instructions given.  Follow up in 3 weeks.  Leonides Sake, MD [facility] [place] Office: [phone] Pager: [phone]  07/30/2012, 12:39 PM

## 2012-08-05 ENCOUNTER — Telehealth: Payer: Self-pay

## 2012-08-05 NOTE — Telephone Encounter (Signed)
Wife called to report that pt's stump incision looks worse.  States there is an area of increased redness and inflammation.  States still taking antibiotic prescribed a week ago.  Denies fever / chills.  Appt. Given for reevaluation of site 08/06/12 at 10:20 AM.  Agrees w/ plan.

## 2012-08-06 ENCOUNTER — Encounter: Payer: Self-pay | Admitting: Neurosurgery

## 2012-08-06 ENCOUNTER — Ambulatory Visit (INDEPENDENT_AMBULATORY_CARE_PROVIDER_SITE_OTHER): Payer: 59 | Admitting: Neurosurgery

## 2012-08-06 VITALS — BP 130/75 | HR 68 | Resp 16 | Ht 71.0 in | Wt 170.0 lb

## 2012-08-06 DIAGNOSIS — I10 Essential (primary) hypertension: Secondary | ICD-10-CM | POA: Diagnosis not present

## 2012-08-06 DIAGNOSIS — I739 Peripheral vascular disease, unspecified: Secondary | ICD-10-CM

## 2012-08-06 DIAGNOSIS — R269 Unspecified abnormalities of gait and mobility: Secondary | ICD-10-CM | POA: Diagnosis not present

## 2012-08-06 DIAGNOSIS — I2510 Atherosclerotic heart disease of native coronary artery without angina pectoris: Secondary | ICD-10-CM | POA: Diagnosis not present

## 2012-08-06 DIAGNOSIS — T8140XA Infection following a procedure, unspecified, initial encounter: Secondary | ICD-10-CM | POA: Insufficient documentation

## 2012-08-06 DIAGNOSIS — G8918 Other acute postprocedural pain: Secondary | ICD-10-CM | POA: Diagnosis not present

## 2012-08-06 DIAGNOSIS — Z48812 Encounter for surgical aftercare following surgery on the circulatory system: Secondary | ICD-10-CM | POA: Diagnosis not present

## 2012-08-06 DIAGNOSIS — M6281 Muscle weakness (generalized): Secondary | ICD-10-CM | POA: Diagnosis not present

## 2012-08-06 NOTE — Progress Notes (Signed)
Subjective:     Patient ID: Stephen Dean, male   DOB: Apr 16, 1929, 78 y.o.   MRN: [id number]  HPI: 78 year-old male patient status post right AKA with Dr. Imogene Burn. The patient was seen 2 weeks to clinic for wound check and was scheduled for 09/03/2012 however his wife called asking for a sooner appointment do to some drainage and redness. The patient was brought in for evaluation. The patient denies any pain.   Review of Systems: Point review of systems is notable for the difficulties described above otherwise unremarkable     Objective:   Physical Exam: Afebrile, vital signs are stable, the surgical wound is well-healed, laterally there is one small area that is slightly red but not draining. Medially there is an area that is indurated and I was able to express a very small amount of pus. The family states it actually looks better than it did when they called asking for the appointment. Dr. Imogene Burn saw the patient as well and agrees that this should heal without any further difficulty     Assessment:     . Assessment as above    Plan:     The patient is to keep the areas clean with just soap and water and keep his stump cap on, he may use a sterile 4 x 4 over the largest area to prevent drainage. The patient has 3 days of Cipro left which he is to finish. They will followup as scheduled 09/03/2012 with Dr. Imogene Burn, if they have further difficulty in the interim they are to call our office and they understand this.   Lauree Chandler ANP  Clinic M.D.: Imogene Burn

## 2012-08-10 ENCOUNTER — Encounter: Payer: Self-pay | Admitting: Physical Medicine & Rehabilitation

## 2012-08-10 ENCOUNTER — Encounter: Payer: Medicare Other | Attending: Physical Medicine & Rehabilitation | Admitting: Physical Medicine & Rehabilitation

## 2012-08-10 VITALS — BP 113/62 | HR 68 | Resp 14 | Ht 71.0 in | Wt 161.0 lb

## 2012-08-10 DIAGNOSIS — L02419 Cutaneous abscess of limb, unspecified: Secondary | ICD-10-CM | POA: Diagnosis not present

## 2012-08-10 DIAGNOSIS — T8740 Infection of amputation stump, unspecified extremity: Secondary | ICD-10-CM | POA: Diagnosis not present

## 2012-08-10 DIAGNOSIS — I10 Essential (primary) hypertension: Secondary | ICD-10-CM | POA: Diagnosis not present

## 2012-08-10 DIAGNOSIS — E785 Hyperlipidemia, unspecified: Secondary | ICD-10-CM | POA: Insufficient documentation

## 2012-08-10 DIAGNOSIS — Y835 Amputation of limb(s) as the cause of abnormal reaction of the patient, or of later complication, without mention of misadventure at the time of the procedure: Secondary | ICD-10-CM | POA: Insufficient documentation

## 2012-08-10 DIAGNOSIS — L03119 Cellulitis of unspecified part of limb: Secondary | ICD-10-CM | POA: Diagnosis not present

## 2012-08-10 DIAGNOSIS — I739 Peripheral vascular disease, unspecified: Secondary | ICD-10-CM | POA: Diagnosis not present

## 2012-08-10 DIAGNOSIS — S78119A Complete traumatic amputation at level between unspecified hip and knee, initial encounter: Secondary | ICD-10-CM

## 2012-08-10 DIAGNOSIS — Z89619 Acquired absence of unspecified leg above knee: Secondary | ICD-10-CM

## 2012-08-10 MED ORDER — CEPHALEXIN 250 MG PO CAPS: 250.0000 mg | ORAL_CAPSULE | Freq: Four times a day (QID) | ORAL | Status: DC

## 2012-08-10 NOTE — Progress Notes (Signed)
Subjective:    Patient ID: Stephen Dean, male    DOB: 07-Jan-1929, 78 y.o.   MRN: [id number]  HPI  Mr Lucks is back regarding his right AKA. He has had a problem with a wound infection. He saw Dr. Imogene Burn (or a PA) earlier this month and was placed on cipro. He has since finished the medication and the wound has become redder in color and has been draining. He doesn't see Imogene Burn back until 09/03/12. He denies pain in the leg. The left leg is intact.  He has completed PT. He is walking around his home with his walker. He is generally ulimited in his ambulation at home. He does ROM exercises regularly   Pain Inventory Average Pain 3 Pain Right Now 3 My pain is aching  In the last 24 hours, has pain interfered with the following? General activity 0 Relation with others 0 Enjoyment of life 0 What TIME of day is your pain at its worst? evening Sleep (in general) Fair  Pain is worse with: unsure Pain improves with: rest Relief from Meds: not taking any  Mobility walk without assistance use a walker ability to climb steps?  no do you drive?  no use a wheelchair transfers alone Do you have any goals in this area?  yes  Function not employed: date last employed 1991 retired I need assistance with the following:  meal prep, household duties and shopping  Neuro/Psych trouble walking  Prior Studies Any changes since last visit?  no  Physicians involved in your care Dr Imogene Burn, Dr Hart Rochester   Family History  Problem Relation Age of Onset  . Kidney disease Mother    History   Social History  . Marital Status: Married    Spouse Name: N/A    Number of Children: N/A  . Years of Education: N/A   Social History Main Topics  . Smoking status: Former Smoker -- 1.0 packs/day    Types: Cigarettes  . Smokeless tobacco: Never Used  . Alcohol Use: No  . Drug Use: No  . Sexually Active: None   Other Topics Concern  . None   Social History Narrative  . None   Past Surgical  History  Procedure Date  . Hernia repair   . Cardiac surgery     Stent placement x 2  . Embolectomy 06/17/2012    Procedure: EMBOLECTOMY;  Surgeon: Fransisco Hertz, MD;  Location: [facility] OR;  Service: Vascular;  Laterality: Right;  Thrombectomy of right tibial arteries,exclusion of right popliteal aneurysm, femoral -popliteal artery bypass graft,four compartment fasciotomies  . Femoral-popliteal bypass graft 06/17/2012    Procedure: BYPASS GRAFT FEMORAL-POPLITEAL ARTERY;  Surgeon: Fransisco Hertz, MD;  Location: [facility] OR;  Service: Vascular;  Laterality: Right;  Using 6 mm x 80 cm propaten goretex graft  . Intraoperative arteriogram 06/17/2012    Procedure: INTRA OPERATIVE ARTERIOGRAM;  Surgeon: Fransisco Hertz, MD;  Location: [facility] OR;  Service: Vascular;  Laterality: Right;  . Fasciotomy 06/17/2012    Procedure: FASCIOTOMY;  Surgeon: Fransisco Hertz, MD;  Location: [facility] OR;  Service: Vascular;  Laterality: Right;  four compartment  fasciotomy  . Amputation 06/18/2012    Procedure: AMPUTATION ABOVE KNEE;  Surgeon: Pryor Ochoa, MD;  Location: [facility] OR;  Service: Vascular;  Laterality: Right;   Past Medical History  Diagnosis Date  . Peripheral vascular disease   . Heart disease   . Hyperlipidemia   . Hypertension    BP 113/62  Pulse 68  Resp 14  Ht 5\' 11"  (1.803 m)  Wt 161 lb (73.029 kg)  BMI 22.45 kg/m2  SpO2 96%     Review of Systems  Musculoskeletal: Positive for gait problem.  All other systems reviewed and are negative.       Objective:   Physical Exam  General: Alert and oriented x 3, No apparent distress HEENT: Head is normocephalic, atraumatic, PERRLA, EOMI, sclera anicteric, oral mucosa pink and moist, dentition intact, ext ear canals clear,  Neck: Supple without JVD or lymphadenopathy Heart: Reg rate and rhythm. No murmurs rubs or gallops Chest: CTA bilaterally without wheezes, rales, or rhonchi; no distress Abdomen: Soft, non-tender, non-distended, bowel sounds  positive. Extremities: No clubbing, cyanosis, or edema. Pulses are 2+ Skin: He has three open areas along the AKA. The medial site has two small openings with surrounding erythema which is blanching. The are is warm. There is some serosanginous drainage from the wound. There are two smaller wounds at the central and lateral aspects. The central wound is closing. The lateral wound has surrounding erythema to a lesser extent. The leg itself was minimally tender. Neuro: Pt is cognitively appropriate with normal insight, memory, and awareness. Cranial nerves 2-12 are intact.   Fine motor coordination is intact. No tremors. Motor function is grossly 5/5.  Musculoskeletal: Full ROM, No pain with AROM or PROM in the neck, trunk, or extremities. Posture appropriate. Right AKA is well formed. No signs of any contractures.  Psych: Pt's affect is appropriate. Pt is cooperative         Assessment & Plan:  1. Right AKA.  -he is not ready for a prosthesis yet due to wound issues  -he will be a K3 ambulator  -I will be happy to write an rx for his prosthesis when he is ready. 2. Cellulitis  -placed pt on keflex for a 10 day course  -instructed him to follow up with surgery sooner if wound doesn't begin to improve by the weekend. 3. Follow up with me prn. All questions were encouraged and answered.

## 2012-08-10 NOTE — Patient Instructions (Signed)
Continue to keep wounds clean, covered. Antibiotic ointment is fine.  If reddened areas don't improve over the next several days, you need to contact Dr. Nicky Pugh office.

## 2012-08-11 DIAGNOSIS — I739 Peripheral vascular disease, unspecified: Secondary | ICD-10-CM | POA: Diagnosis not present

## 2012-08-11 DIAGNOSIS — L989 Disorder of the skin and subcutaneous tissue, unspecified: Secondary | ICD-10-CM | POA: Diagnosis not present

## 2012-08-11 DIAGNOSIS — E7800 Pure hypercholesterolemia, unspecified: Secondary | ICD-10-CM | POA: Diagnosis not present

## 2012-08-11 DIAGNOSIS — I10 Essential (primary) hypertension: Secondary | ICD-10-CM | POA: Diagnosis not present

## 2012-09-02 ENCOUNTER — Encounter: Payer: Self-pay | Admitting: Vascular Surgery

## 2012-09-03 ENCOUNTER — Ambulatory Visit (INDEPENDENT_AMBULATORY_CARE_PROVIDER_SITE_OTHER): Payer: 59 | Admitting: Vascular Surgery

## 2012-09-03 ENCOUNTER — Encounter: Payer: Self-pay | Admitting: Vascular Surgery

## 2012-09-03 VITALS — BP 136/65 | HR 64 | Ht 71.0 in | Wt 161.0 lb

## 2012-09-03 DIAGNOSIS — Z89619 Acquired absence of unspecified leg above knee: Secondary | ICD-10-CM

## 2012-09-03 DIAGNOSIS — S78119A Complete traumatic amputation at level between unspecified hip and knee, initial encounter: Secondary | ICD-10-CM

## 2012-09-03 NOTE — Progress Notes (Signed)
[facility] [place]  Postoperative Visit  History of Present Illness  Stephen Dean is a 79 y.o. male who presents for postoperative follow-up for:   PROCEDURE (06/17/12):  1. Right anterior tibial artery thrombectomy  2. Exclusion of popliteal artery  3. Right common femoral artery to below-the-knee popliteal artery bypass with 6 mm Propaten  4. Open cannulation of right common femoral artery  5. Right leg runoff  6. Right four-compartment fasciotomy  Unfortunately the calf muscle died by the time the procedure was completed, so he required:  Right above-the-knee amputation (Date: 06/18/12).  The patient's wound has some necrotic fat pockets which required superficial drainage and antibiotics.  His wound have healed at this point.  The patient notes pain is well controlled.  The patient's current symptoms are: none.  Physical Examination  Filed Vitals:   09/03/12 1019  BP: 136/65  Pulse: 64   R AKA: Incision is healed.  No further signs of infection.  Medical Decision Making  Stephen Dean is a 79 y.o. male who presents s/p R above-the-knee ampuation.  The patient's stump is healed appropriately with resolution of pre-operative symptoms. I discussed in depth with the patient the nature of atherosclerosis, and emphasized the importance of maximal medical management including strict control of blood pressure, blood glucose, and lipid levels, obtaining regular exercise, and cessation of smoking.  The patient is aware that without maximal medical management the underlying atherosclerotic disease process will progress, possibly leading to a more proximal amputation. The patient agrees to participate in their maximal medical care.  Thank you for allowing [place] to participate in this patient's care.  The patient has been referred for prosthetic fitting.  The patient can follow up with [place] as needed.  Leonides Sake, MD [facility]  [place] Office: [phone] Pager: [phone]

## 2012-10-18 DIAGNOSIS — I2510 Atherosclerotic heart disease of native coronary artery without angina pectoris: Secondary | ICD-10-CM | POA: Diagnosis not present

## 2012-10-18 DIAGNOSIS — I119 Hypertensive heart disease without heart failure: Secondary | ICD-10-CM | POA: Diagnosis not present

## 2012-10-18 DIAGNOSIS — R609 Edema, unspecified: Secondary | ICD-10-CM | POA: Diagnosis not present

## 2012-10-18 DIAGNOSIS — I4510 Unspecified right bundle-branch block: Secondary | ICD-10-CM | POA: Diagnosis not present

## 2012-11-04 ENCOUNTER — Ambulatory Visit: Payer: Medicare Other | Attending: Vascular Surgery | Admitting: Physical Therapy

## 2012-11-04 DIAGNOSIS — R5381 Other malaise: Secondary | ICD-10-CM | POA: Diagnosis not present

## 2012-11-04 DIAGNOSIS — Z4789 Encounter for other orthopedic aftercare: Secondary | ICD-10-CM | POA: Diagnosis not present

## 2012-11-04 DIAGNOSIS — R269 Unspecified abnormalities of gait and mobility: Secondary | ICD-10-CM | POA: Diagnosis not present

## 2012-11-04 DIAGNOSIS — S88119A Complete traumatic amputation at level between knee and ankle, unspecified lower leg, initial encounter: Secondary | ICD-10-CM | POA: Insufficient documentation

## 2012-11-04 DIAGNOSIS — IMO0001 Reserved for inherently not codable concepts without codable children: Secondary | ICD-10-CM | POA: Insufficient documentation

## 2012-11-08 ENCOUNTER — Ambulatory Visit: Payer: Medicare Other | Admitting: Physical Therapy

## 2012-11-11 ENCOUNTER — Ambulatory Visit: Payer: Medicare Other | Admitting: Physical Therapy

## 2012-11-11 DIAGNOSIS — IMO0001 Reserved for inherently not codable concepts without codable children: Secondary | ICD-10-CM | POA: Diagnosis not present

## 2012-11-11 DIAGNOSIS — R269 Unspecified abnormalities of gait and mobility: Secondary | ICD-10-CM | POA: Diagnosis not present

## 2012-11-11 DIAGNOSIS — S88119A Complete traumatic amputation at level between knee and ankle, unspecified lower leg, initial encounter: Secondary | ICD-10-CM | POA: Diagnosis not present

## 2012-11-11 DIAGNOSIS — Z4789 Encounter for other orthopedic aftercare: Secondary | ICD-10-CM | POA: Diagnosis not present

## 2012-11-11 DIAGNOSIS — R5381 Other malaise: Secondary | ICD-10-CM | POA: Diagnosis not present

## 2012-11-12 ENCOUNTER — Ambulatory Visit: Payer: Medicare Other | Admitting: Physical Therapy

## 2012-11-12 DIAGNOSIS — IMO0001 Reserved for inherently not codable concepts without codable children: Secondary | ICD-10-CM | POA: Diagnosis not present

## 2012-11-12 DIAGNOSIS — R5381 Other malaise: Secondary | ICD-10-CM | POA: Diagnosis not present

## 2012-11-12 DIAGNOSIS — Z4789 Encounter for other orthopedic aftercare: Secondary | ICD-10-CM | POA: Diagnosis not present

## 2012-11-12 DIAGNOSIS — R269 Unspecified abnormalities of gait and mobility: Secondary | ICD-10-CM | POA: Diagnosis not present

## 2012-11-12 DIAGNOSIS — S88119A Complete traumatic amputation at level between knee and ankle, unspecified lower leg, initial encounter: Secondary | ICD-10-CM | POA: Diagnosis not present

## 2012-11-16 ENCOUNTER — Ambulatory Visit: Payer: Medicare Other | Admitting: Physical Therapy

## 2012-11-16 DIAGNOSIS — IMO0001 Reserved for inherently not codable concepts without codable children: Secondary | ICD-10-CM | POA: Diagnosis not present

## 2012-11-16 DIAGNOSIS — R5381 Other malaise: Secondary | ICD-10-CM | POA: Diagnosis not present

## 2012-11-16 DIAGNOSIS — S88119A Complete traumatic amputation at level between knee and ankle, unspecified lower leg, initial encounter: Secondary | ICD-10-CM | POA: Diagnosis not present

## 2012-11-16 DIAGNOSIS — Z4789 Encounter for other orthopedic aftercare: Secondary | ICD-10-CM | POA: Diagnosis not present

## 2012-11-16 DIAGNOSIS — R269 Unspecified abnormalities of gait and mobility: Secondary | ICD-10-CM | POA: Diagnosis not present

## 2012-11-18 ENCOUNTER — Ambulatory Visit: Payer: Medicare Other | Admitting: Physical Therapy

## 2012-11-18 DIAGNOSIS — R5381 Other malaise: Secondary | ICD-10-CM | POA: Diagnosis not present

## 2012-11-18 DIAGNOSIS — IMO0001 Reserved for inherently not codable concepts without codable children: Secondary | ICD-10-CM | POA: Diagnosis not present

## 2012-11-18 DIAGNOSIS — R269 Unspecified abnormalities of gait and mobility: Secondary | ICD-10-CM | POA: Diagnosis not present

## 2012-11-18 DIAGNOSIS — S88119A Complete traumatic amputation at level between knee and ankle, unspecified lower leg, initial encounter: Secondary | ICD-10-CM | POA: Diagnosis not present

## 2012-11-18 DIAGNOSIS — Z4789 Encounter for other orthopedic aftercare: Secondary | ICD-10-CM | POA: Diagnosis not present

## 2012-11-23 ENCOUNTER — Ambulatory Visit: Payer: Medicare Other | Attending: Vascular Surgery | Admitting: Physical Therapy

## 2012-11-23 DIAGNOSIS — R5381 Other malaise: Secondary | ICD-10-CM | POA: Diagnosis not present

## 2012-11-23 DIAGNOSIS — R269 Unspecified abnormalities of gait and mobility: Secondary | ICD-10-CM | POA: Diagnosis not present

## 2012-11-23 DIAGNOSIS — IMO0001 Reserved for inherently not codable concepts without codable children: Secondary | ICD-10-CM | POA: Insufficient documentation

## 2012-11-26 ENCOUNTER — Ambulatory Visit: Payer: Medicare Other | Admitting: Physical Therapy

## 2012-12-01 ENCOUNTER — Ambulatory Visit: Payer: Medicare Other | Admitting: Physical Therapy

## 2012-12-03 ENCOUNTER — Ambulatory Visit: Payer: Medicare Other | Admitting: Physical Therapy

## 2012-12-07 ENCOUNTER — Ambulatory Visit: Payer: Medicare Other | Admitting: Physical Therapy

## 2012-12-09 ENCOUNTER — Ambulatory Visit: Payer: Medicare Other | Admitting: Physical Therapy

## 2012-12-14 ENCOUNTER — Ambulatory Visit: Payer: Medicare Other | Admitting: Physical Therapy

## 2012-12-16 ENCOUNTER — Ambulatory Visit: Payer: Medicare Other | Admitting: Physical Therapy

## 2012-12-21 ENCOUNTER — Ambulatory Visit: Payer: Medicare Other | Admitting: Physical Therapy

## 2012-12-23 ENCOUNTER — Encounter: Payer: Medicare Other | Admitting: Physical Therapy

## 2012-12-23 ENCOUNTER — Ambulatory Visit: Payer: Medicare Other | Attending: Vascular Surgery | Admitting: Physical Therapy

## 2012-12-23 DIAGNOSIS — R269 Unspecified abnormalities of gait and mobility: Secondary | ICD-10-CM | POA: Insufficient documentation

## 2012-12-23 DIAGNOSIS — R5381 Other malaise: Secondary | ICD-10-CM | POA: Insufficient documentation

## 2012-12-23 DIAGNOSIS — IMO0001 Reserved for inherently not codable concepts without codable children: Secondary | ICD-10-CM | POA: Insufficient documentation

## 2012-12-28 ENCOUNTER — Ambulatory Visit: Payer: Medicare Other | Admitting: Physical Therapy

## 2012-12-28 DIAGNOSIS — IMO0001 Reserved for inherently not codable concepts without codable children: Secondary | ICD-10-CM | POA: Diagnosis not present

## 2012-12-28 DIAGNOSIS — R269 Unspecified abnormalities of gait and mobility: Secondary | ICD-10-CM | POA: Diagnosis not present

## 2012-12-28 DIAGNOSIS — R5381 Other malaise: Secondary | ICD-10-CM | POA: Diagnosis not present

## 2012-12-30 ENCOUNTER — Ambulatory Visit: Payer: Medicare Other | Admitting: Physical Therapy

## 2012-12-30 DIAGNOSIS — IMO0001 Reserved for inherently not codable concepts without codable children: Secondary | ICD-10-CM | POA: Diagnosis not present

## 2012-12-30 DIAGNOSIS — R269 Unspecified abnormalities of gait and mobility: Secondary | ICD-10-CM | POA: Diagnosis not present

## 2012-12-30 DIAGNOSIS — R5381 Other malaise: Secondary | ICD-10-CM | POA: Diagnosis not present

## 2013-01-04 ENCOUNTER — Ambulatory Visit: Payer: Medicare Other | Admitting: Physical Therapy

## 2013-01-04 DIAGNOSIS — R5381 Other malaise: Secondary | ICD-10-CM | POA: Diagnosis not present

## 2013-01-04 DIAGNOSIS — R269 Unspecified abnormalities of gait and mobility: Secondary | ICD-10-CM | POA: Diagnosis not present

## 2013-01-04 DIAGNOSIS — IMO0001 Reserved for inherently not codable concepts without codable children: Secondary | ICD-10-CM | POA: Diagnosis not present

## 2013-01-06 ENCOUNTER — Ambulatory Visit: Payer: Medicare Other | Admitting: Physical Therapy

## 2013-01-06 DIAGNOSIS — IMO0001 Reserved for inherently not codable concepts without codable children: Secondary | ICD-10-CM | POA: Diagnosis not present

## 2013-01-06 DIAGNOSIS — R269 Unspecified abnormalities of gait and mobility: Secondary | ICD-10-CM | POA: Diagnosis not present

## 2013-01-06 DIAGNOSIS — R5381 Other malaise: Secondary | ICD-10-CM | POA: Diagnosis not present

## 2013-01-10 ENCOUNTER — Ambulatory Visit: Payer: Medicare Other | Admitting: Physical Therapy

## 2013-01-10 DIAGNOSIS — R5381 Other malaise: Secondary | ICD-10-CM | POA: Diagnosis not present

## 2013-01-10 DIAGNOSIS — R269 Unspecified abnormalities of gait and mobility: Secondary | ICD-10-CM | POA: Diagnosis not present

## 2013-01-10 DIAGNOSIS — IMO0001 Reserved for inherently not codable concepts without codable children: Secondary | ICD-10-CM | POA: Diagnosis not present

## 2013-01-12 ENCOUNTER — Ambulatory Visit: Payer: Medicare Other | Admitting: Physical Therapy

## 2013-01-12 DIAGNOSIS — R5381 Other malaise: Secondary | ICD-10-CM | POA: Diagnosis not present

## 2013-01-12 DIAGNOSIS — R269 Unspecified abnormalities of gait and mobility: Secondary | ICD-10-CM | POA: Diagnosis not present

## 2013-01-12 DIAGNOSIS — IMO0001 Reserved for inherently not codable concepts without codable children: Secondary | ICD-10-CM | POA: Diagnosis not present

## 2013-01-18 ENCOUNTER — Ambulatory Visit: Payer: Medicare Other | Admitting: Physical Therapy

## 2013-01-18 DIAGNOSIS — R5381 Other malaise: Secondary | ICD-10-CM | POA: Diagnosis not present

## 2013-01-18 DIAGNOSIS — R269 Unspecified abnormalities of gait and mobility: Secondary | ICD-10-CM | POA: Diagnosis not present

## 2013-01-18 DIAGNOSIS — IMO0001 Reserved for inherently not codable concepts without codable children: Secondary | ICD-10-CM | POA: Diagnosis not present

## 2013-01-20 ENCOUNTER — Ambulatory Visit: Payer: Medicare Other | Admitting: Physical Therapy

## 2013-01-20 DIAGNOSIS — R5381 Other malaise: Secondary | ICD-10-CM | POA: Diagnosis not present

## 2013-01-20 DIAGNOSIS — IMO0001 Reserved for inherently not codable concepts without codable children: Secondary | ICD-10-CM | POA: Diagnosis not present

## 2013-01-20 DIAGNOSIS — R269 Unspecified abnormalities of gait and mobility: Secondary | ICD-10-CM | POA: Diagnosis not present

## 2013-01-24 ENCOUNTER — Ambulatory Visit: Payer: Medicare Other | Attending: Vascular Surgery | Admitting: Physical Therapy

## 2013-01-24 DIAGNOSIS — R269 Unspecified abnormalities of gait and mobility: Secondary | ICD-10-CM | POA: Insufficient documentation

## 2013-01-24 DIAGNOSIS — IMO0001 Reserved for inherently not codable concepts without codable children: Secondary | ICD-10-CM | POA: Insufficient documentation

## 2013-01-24 DIAGNOSIS — R5381 Other malaise: Secondary | ICD-10-CM | POA: Insufficient documentation

## 2013-01-27 ENCOUNTER — Ambulatory Visit: Payer: Medicare Other | Admitting: Physical Therapy

## 2013-01-31 ENCOUNTER — Encounter: Payer: Medicare Other | Admitting: Physical Therapy

## 2013-02-02 ENCOUNTER — Encounter: Payer: Medicare Other | Admitting: Physical Therapy

## 2013-02-08 ENCOUNTER — Ambulatory Visit: Payer: Medicare Other | Admitting: Physical Therapy

## 2013-02-10 ENCOUNTER — Ambulatory Visit: Payer: Medicare Other | Admitting: Physical Therapy

## 2013-02-10 DIAGNOSIS — F329 Major depressive disorder, single episode, unspecified: Secondary | ICD-10-CM | POA: Diagnosis not present

## 2013-02-10 DIAGNOSIS — H9190 Unspecified hearing loss, unspecified ear: Secondary | ICD-10-CM | POA: Diagnosis not present

## 2013-02-10 DIAGNOSIS — I10 Essential (primary) hypertension: Secondary | ICD-10-CM | POA: Diagnosis not present

## 2013-02-10 DIAGNOSIS — I739 Peripheral vascular disease, unspecified: Secondary | ICD-10-CM | POA: Diagnosis not present

## 2013-02-10 DIAGNOSIS — Z0000 Encounter for general adult medical examination without abnormal findings: Secondary | ICD-10-CM | POA: Diagnosis not present

## 2013-02-10 DIAGNOSIS — E7800 Pure hypercholesterolemia, unspecified: Secondary | ICD-10-CM | POA: Diagnosis not present

## 2013-02-15 ENCOUNTER — Ambulatory Visit: Payer: Medicare Other | Admitting: Physical Therapy

## 2013-02-17 ENCOUNTER — Ambulatory Visit: Payer: Medicare Other | Admitting: Physical Therapy

## 2013-02-18 DIAGNOSIS — H903 Sensorineural hearing loss, bilateral: Secondary | ICD-10-CM | POA: Diagnosis not present

## 2013-02-22 ENCOUNTER — Ambulatory Visit: Payer: Medicare Other | Attending: Vascular Surgery | Admitting: Physical Therapy

## 2013-02-22 DIAGNOSIS — R5381 Other malaise: Secondary | ICD-10-CM | POA: Insufficient documentation

## 2013-02-22 DIAGNOSIS — R269 Unspecified abnormalities of gait and mobility: Secondary | ICD-10-CM | POA: Insufficient documentation

## 2013-02-22 DIAGNOSIS — IMO0001 Reserved for inherently not codable concepts without codable children: Secondary | ICD-10-CM | POA: Insufficient documentation

## 2013-02-24 ENCOUNTER — Ambulatory Visit: Payer: Medicare Other | Admitting: Physical Therapy

## 2013-02-28 ENCOUNTER — Ambulatory Visit: Payer: Medicare Other | Admitting: Physical Therapy

## 2013-03-02 ENCOUNTER — Ambulatory Visit: Payer: Medicare Other | Admitting: Physical Therapy

## 2013-03-08 ENCOUNTER — Encounter: Payer: Medicare Other | Admitting: Physical Therapy

## 2013-03-10 ENCOUNTER — Encounter: Payer: Medicare Other | Admitting: Physical Therapy

## 2013-03-10 DIAGNOSIS — R319 Hematuria, unspecified: Secondary | ICD-10-CM | POA: Diagnosis not present

## 2013-03-23 DIAGNOSIS — R319 Hematuria, unspecified: Secondary | ICD-10-CM | POA: Diagnosis not present

## 2013-03-23 DIAGNOSIS — N390 Urinary tract infection, site not specified: Secondary | ICD-10-CM | POA: Diagnosis not present

## 2013-03-24 ENCOUNTER — Ambulatory Visit (INDEPENDENT_AMBULATORY_CARE_PROVIDER_SITE_OTHER): Payer: Medicare Other | Admitting: Cardiovascular Disease

## 2013-03-24 ENCOUNTER — Encounter: Payer: Self-pay | Admitting: Cardiovascular Disease

## 2013-03-24 VITALS — BP 110/70 | HR 63 | Ht 71.0 in | Wt 188.8 lb

## 2013-03-24 DIAGNOSIS — E785 Hyperlipidemia, unspecified: Secondary | ICD-10-CM

## 2013-03-24 DIAGNOSIS — I10 Essential (primary) hypertension: Secondary | ICD-10-CM | POA: Diagnosis not present

## 2013-03-24 DIAGNOSIS — I2510 Atherosclerotic heart disease of native coronary artery without angina pectoris: Secondary | ICD-10-CM | POA: Diagnosis not present

## 2013-03-24 NOTE — Patient Instructions (Signed)
Your physician recommends that you schedule a follow-up appointment in: [facility] 2015.  No other changes made today.

## 2013-04-02 ENCOUNTER — Encounter: Payer: Self-pay | Admitting: Cardiovascular Disease

## 2013-04-02 DIAGNOSIS — I10 Essential (primary) hypertension: Secondary | ICD-10-CM | POA: Insufficient documentation

## 2013-04-02 DIAGNOSIS — E785 Hyperlipidemia, unspecified: Secondary | ICD-10-CM | POA: Insufficient documentation

## 2013-04-02 DIAGNOSIS — I2510 Atherosclerotic heart disease of native coronary artery without angina pectoris: Secondary | ICD-10-CM | POA: Insufficient documentation

## 2013-04-02 NOTE — Progress Notes (Signed)
Patient ID: Stephen Dean, male   DOB: October 24, 1928, 79 y.o.   MRN: [id number]     HPI: Stephen Dean, is a 79 y.o. male who presents to the office today for six-month cardiology evaluation.  Mr. Stephen Dean has known coronary as well as peripheral vascular disease. If every 1999 he underwent PTCA of a long 70-80% RCA stenosis. At that time he had total occlusion of the circumflex and segmental stenoses of his LAD. The patient is status post abdominal aortic aneurysm repair and right iliac artery aneurysm repair by Dr. Madilyn Fireman. He has a history of right bundle branch block, hypertension, hyperlipidemia. He required right anterior tibial artery thrombectomy in October 2013 and subsequently underwent right above-the-knee amputation by Dr. Hart Rochester. His last nuclear perfusion study in August 2013 was unchanged from previously and showed the old scar in the inferior wall. Over the past 6 months, he denies recent episodes of chest pain or awareness of tachycardia palpitations. His mobility is somewhat limited. He now sees Dr. Nehemiah Settle for primary care.    Past Medical History  Diagnosis Date  . Peripheral vascular disease   . Heart disease   . Hyperlipidemia   . Hypertension     Past Surgical History  Procedure Laterality Date  . Hernia repair    . Cardiac surgery      Stent placement x 2  . Embolectomy  06/17/2012    Procedure: EMBOLECTOMY;  Surgeon: Fransisco Hertz, MD;  Location: [facility] OR;  Service: Vascular;  Laterality: Right;  Thrombectomy of right tibial arteries,exclusion of right popliteal aneurysm, femoral -popliteal artery bypass graft,four compartment fasciotomies  . Femoral-popliteal bypass graft  06/17/2012    Procedure: BYPASS GRAFT FEMORAL-POPLITEAL ARTERY;  Surgeon: Fransisco Hertz, MD;  Location: [facility] OR;  Service: Vascular;  Laterality: Right;  Using 6 mm x 80 cm propaten goretex graft  . Intraoperative arteriogram  06/17/2012    Procedure: INTRA OPERATIVE ARTERIOGRAM;  Surgeon: Fransisco Hertz, MD;  Location: [facility] OR;  Service: Vascular;  Laterality: Right;  . Fasciotomy  06/17/2012    Procedure: FASCIOTOMY;  Surgeon: Fransisco Hertz, MD;  Location: [facility] OR;  Service: Vascular;  Laterality: Right;  four compartment  fasciotomy  . Amputation  06/18/2012    Procedure: AMPUTATION ABOVE KNEE;  Surgeon: Pryor Ochoa, MD;  Location: [facility] OR;  Service: Vascular;  Laterality: Right;    No Known Allergies  Current Outpatient Prescriptions  Medication Sig Dispense Refill  . acetaminophen (TYLENOL) 325 MG tablet Take 650 mg by mouth every 6 (six) hours as needed.      Marland Kitchen aspirin 81 MG chewable tablet Chew 81 mg by mouth daily.      Marland Kitchen atenolol (TENORMIN) 50 MG tablet Take 50 mg by mouth daily.      . citalopram (CELEXA) 10 MG tablet Take 10 mg by mouth daily.      . fosinopril (MONOPRIL) 10 MG tablet Take 10 mg by mouth daily.      . hydrochlorothiazide (HYDRODIURIL) 12.5 MG tablet Take 12.5 mg by mouth daily.      . Multiple Vitamins-Minerals (CENTURY SENIOR PO) Take 1 tablet by mouth daily.      Marland Kitchen omega-3 acid ethyl esters (LOVAZA) 1 G capsule Take 1 g by mouth 2 (two) times daily.      . pravastatin (PRAVACHOL) 40 MG tablet Take 80 mg by mouth daily.      . vitamin C (ASCORBIC ACID) 500 MG tablet Take 250 mg by  mouth daily.      . vitamin E 400 UNIT capsule Take 400 Units by mouth daily.       No current facility-administered medications for this visit.    History   Social History  . Marital Status: Married    Spouse Name: N/A    Number of Children: N/A  . Years of Education: N/A   Occupational History  . Not on file.   Social History Main Topics  . Smoking status: Former Smoker -- 1.00 packs/day    Types: Cigarettes  . Smokeless tobacco: Never Used  . Alcohol Use: No  . Drug Use: No  . Sexually Active: Not on file   Other Topics Concern  . Not on file   Social History Narrative  . No narrative on file    Social he is notable that he is married has 2 children one  grandchild. There is a remote tobacco history having quit last year  ROS is negative for fevers, chills or night sweats he denies visual symptoms. He denies tachycardia palpitations. There is no syncope. He denies recent episodes of chest pain. He denies bleeding. He denies recent leg swelling. Other system review is negative.  PE BP 110/70  Pulse 63  Ht 5\' 11"  (1.803 m)  Wt 188 lb 12.8 oz (85.639 kg)  BMI 26.34 kg/m2  General: Alert, oriented, no distress.  Skin: normal turgor, no rashes HEENT: Normocephalic, atraumatic. Pupils round and reactive; sclera anicteric;no lid lag.  Nose without nasal septal hypertrophy Mouth/Parynx benign; Mallinpatti scale 3 Neck: No JVD, no carotid briuts Lungs: Decreased breath sounds; no wheezing or rales Heart: RRR, s1 s2 normal 1/6 systolic murmur. Abdomen: soft, nontender; no hepatosplenomehaly, BS+; abdominal aorta nontender and not dilated by palpation. Pulses 2+ Extremities: Status post right AKA; no clubbing cyanosis or edema, Homan's sign negative  Neurologic: grossly nonfocal  ECG: Normal sinus rhythm with right bundle branch block and probable left anterior fascicular block  LABS:  BMET    Component Value Date/Time   NA 137 06/23/2012 0509   K 3.4* 06/23/2012 0509   CL 99 06/23/2012 0509   CO2 29 06/23/2012 0509   GLUCOSE 107* 06/23/2012 0509   BUN 11 06/23/2012 0509   CREATININE 0.60 06/23/2012 0509   CALCIUM 8.5 06/23/2012 0509   GFRNONAA >90 06/23/2012 0509   GFRAA >90 06/23/2012 0509     Hepatic Function Panel     Component Value Date/Time   PROT 5.7* 06/23/2012 0509   ALBUMIN 2.5* 06/23/2012 0509   AST 53* 06/23/2012 0509   ALT 51 06/23/2012 0509   ALKPHOS 59 06/23/2012 0509   BILITOT 0.9 06/23/2012 0509     CBC    Component Value Date/Time   WBC 8.5 06/23/2012 0509   RBC 3.69* 06/23/2012 0509   HGB 11.9* 06/23/2012 0509   HCT 34.4* 06/23/2012 0509   PLT 200 06/23/2012 0509   MCV 93.2 06/23/2012 0509    MCH 32.2 06/23/2012 0509   MCHC 34.6 06/23/2012 0509   RDW 13.0 06/23/2012 0509   LYMPHSABS 1.3 06/23/2012 0509   MONOABS 1.0 06/23/2012 0509   EOSABS 0.3 06/23/2012 0509   BASOSABS 0.0 06/23/2012 0509     BNP No results found for this basename: probnp    Lipid Panel  No results found for this basename: chol, trig, hdl, cholhdl, vldl, ldlcalc     RADIOLOGY: No results found.    ASSESSMENT AND PLAN: Mr. Stephen Dean has known coronary as well as peripheral vascular  disease. He is status post intervention to his right coronary artery and has known total occlusion to a circumflex with disease in his LAD. His last nuclear perfusion study did show inferior to inferolateral scar concordant with a circumflex occlusion. He is now 9 months following his right AKA. He does have blood pressure presently. In December 2013 total cholesterol was 119 HDL 43 LDL 43 and triglycerides 135 on his current medical regimen he'll continue current treatment as prescribed. I'll see him in March 2015 for further evaluation.     Lennette Bihari, MD, [facility]  04/02/2013 4:19 PM

## 2013-06-06 DIAGNOSIS — Z23 Encounter for immunization: Secondary | ICD-10-CM | POA: Diagnosis not present

## 2013-08-09 ENCOUNTER — Ambulatory Visit
Admission: RE | Admit: 2013-08-09 | Discharge: 2013-08-09 | Disposition: A | Discharge status: Home / Self Care | Payer: Medicare Other | Source: Ambulatory Visit | Attending: Internal Medicine | Admitting: Internal Medicine

## 2013-08-09 ENCOUNTER — Other Ambulatory Visit: Payer: Self-pay | Admitting: Internal Medicine

## 2013-08-09 DIAGNOSIS — R05 Cough: Secondary | ICD-10-CM

## 2013-08-09 DIAGNOSIS — E7800 Pure hypercholesterolemia, unspecified: Secondary | ICD-10-CM | POA: Diagnosis not present

## 2013-08-09 DIAGNOSIS — I739 Peripheral vascular disease, unspecified: Secondary | ICD-10-CM | POA: Diagnosis not present

## 2013-08-09 DIAGNOSIS — R059 Cough, unspecified: Secondary | ICD-10-CM

## 2013-08-09 DIAGNOSIS — I10 Essential (primary) hypertension: Secondary | ICD-10-CM | POA: Diagnosis not present

## 2013-08-09 DIAGNOSIS — Z23 Encounter for immunization: Secondary | ICD-10-CM | POA: Diagnosis not present

## 2013-09-12 ENCOUNTER — Telehealth: Payer: Self-pay | Admitting: Cardiovascular Disease

## 2013-09-12 MED ORDER — FOSINOPRIL SODIUM 10 MG PO TABS: 10.0000 mg | ORAL_TABLET | Freq: Every day | ORAL | Status: DC

## 2013-09-12 MED ORDER — HYDROCHLOROTHIAZIDE 12.5 MG PO TABS: 12.5000 mg | ORAL_TABLET | Freq: Every day | ORAL | Status: DC

## 2013-09-12 MED ORDER — ATENOLOL 50 MG PO TABS: 50.0000 mg | ORAL_TABLET | Freq: Every day | ORAL | Status: DC

## 2013-09-12 MED ORDER — PRAVASTATIN SODIUM 80 MG PO TABS: 80.0000 mg | ORAL_TABLET | Freq: Every day | ORAL | Status: DC

## 2013-09-12 NOTE — Telephone Encounter (Signed)
Refill(s) sent to pharmacy.  Pt needs an appt for refills.  Appt due March 2015.

## 2013-09-12 NOTE — Telephone Encounter (Signed)
Needs new RX's called into [facility] on [address]  (switching from mail order to local )  Pravastatin 80mg  1 daily 90 tablets,  Hydrochlorot 12.5 mg 1 daily 90 day supply,  Fosinopril 10mg  1 daily  90 day supply and Atenolol 50 mg 1 daily 90 day supply.  Please call.

## 2013-09-20 DIAGNOSIS — I10 Essential (primary) hypertension: Secondary | ICD-10-CM | POA: Diagnosis not present

## 2013-09-20 DIAGNOSIS — R059 Cough, unspecified: Secondary | ICD-10-CM | POA: Diagnosis not present

## 2013-09-20 DIAGNOSIS — R05 Cough: Secondary | ICD-10-CM | POA: Diagnosis not present

## 2013-11-01 DIAGNOSIS — R05 Cough: Secondary | ICD-10-CM | POA: Diagnosis not present

## 2013-11-01 DIAGNOSIS — R059 Cough, unspecified: Secondary | ICD-10-CM | POA: Diagnosis not present

## 2013-11-01 DIAGNOSIS — J449 Chronic obstructive pulmonary disease, unspecified: Secondary | ICD-10-CM | POA: Diagnosis not present

## 2013-12-19 ENCOUNTER — Other Ambulatory Visit: Payer: Self-pay | Admitting: *Deleted

## 2013-12-19 MED ORDER — ATENOLOL 50 MG PO TABS: 50.0000 mg | ORAL_TABLET | Freq: Every day | ORAL | Status: DC

## 2013-12-19 MED ORDER — HYDROCHLOROTHIAZIDE 12.5 MG PO TABS: 12.5000 mg | ORAL_TABLET | Freq: Every day | ORAL | Status: DC

## 2013-12-19 MED ORDER — PRAVASTATIN SODIUM 80 MG PO TABS: 80.0000 mg | ORAL_TABLET | Freq: Every day | ORAL | Status: DC

## 2013-12-22 ENCOUNTER — Ambulatory Visit (INDEPENDENT_AMBULATORY_CARE_PROVIDER_SITE_OTHER): Payer: Medicare Other | Admitting: Cardiovascular Disease

## 2013-12-22 ENCOUNTER — Encounter: Payer: Self-pay | Admitting: Cardiovascular Disease

## 2013-12-22 VITALS — BP 110/70 | HR 73 | Ht 71.0 in | Wt 202.9 lb

## 2013-12-22 DIAGNOSIS — I739 Peripheral vascular disease, unspecified: Secondary | ICD-10-CM

## 2013-12-22 DIAGNOSIS — I2510 Atherosclerotic heart disease of native coronary artery without angina pectoris: Secondary | ICD-10-CM | POA: Diagnosis not present

## 2013-12-22 DIAGNOSIS — R5381 Other malaise: Secondary | ICD-10-CM | POA: Diagnosis not present

## 2013-12-22 DIAGNOSIS — Z89619 Acquired absence of unspecified leg above knee: Secondary | ICD-10-CM

## 2013-12-22 DIAGNOSIS — E782 Mixed hyperlipidemia: Secondary | ICD-10-CM

## 2013-12-22 DIAGNOSIS — Z9889 Other specified postprocedural states: Secondary | ICD-10-CM | POA: Insufficient documentation

## 2013-12-22 DIAGNOSIS — I10 Essential (primary) hypertension: Secondary | ICD-10-CM | POA: Diagnosis not present

## 2013-12-22 DIAGNOSIS — E785 Hyperlipidemia, unspecified: Secondary | ICD-10-CM

## 2013-12-22 DIAGNOSIS — R5383 Other fatigue: Secondary | ICD-10-CM

## 2013-12-22 DIAGNOSIS — S78119A Complete traumatic amputation at level between unspecified hip and knee, initial encounter: Secondary | ICD-10-CM

## 2013-12-22 NOTE — Patient Instructions (Signed)
Your physician recommends that you return for lab work fasting.  Your physician recommends that you schedule a follow-up appointment in: 6 months. 

## 2013-12-22 NOTE — Progress Notes (Signed)
Patient ID: KIONTE BAUMGARDNER, male   DOB: 03/19/1929, 80 y.o.   MRN: [id number]      HPI: BLAIKE VICKERS is a 80 y.o. male who presents to the office today for 38-month cardiology evaluation.  [name] has known coronary as well as peripheral vascular disease. In February 1999 he underwent PTCA of a long 70-80% RCA stenosis. At that time he had total occlusion of the circumflex and segmental stenoses of his LAD. He is status post abdominal aortic aneurysm repair and right iliac artery aneurysm repair by Dr. Amedeo Plenty. He has a history of right bundle branch block, hypertension, hyperlipidemia. He required right anterior tibial artery thrombectomy in October 2013 and subsequently underwent right above-the-knee amputation by Dr. Kellie Simmering. His last nuclear perfusion study in August 2013 was unchanged from previously and showed the old scar in the inferior wall.   Since I last saw him his in July 2014, he has remained fairly stable.  He is on atenolol 50 mg, Monopril, 10 mg, and HCTZ 12.5 mg for hypertension.  He is on Pravachol 80 mg and Lovaza1 g twice a day for hyperlipidemia.  He takes Celexa 10 mg for depression.  His ambulation is limited secondary to his right leg prosthesis.  He does note some trivial left ankle edema.  He denies recurrent anginal symptoms.  He denies palpitations.  He denies presyncope or syncope  Past Medical History  Diagnosis Date  . Peripheral vascular disease   . Heart disease   . Hyperlipidemia   . Hypertension     Past Surgical History  Procedure Laterality Date  . Hernia repair    . Cardiac surgery      Stent placement x 2  . Embolectomy  06/17/2012    Procedure: EMBOLECTOMY;  Surgeon: Conrad Bascom, MD;  Location: [place];  Service: Vascular;  Laterality: Right;  Thrombectomy of right tibial arteries,exclusion of right popliteal aneurysm, femoral -popliteal artery bypass graft,four compartment fasciotomies  . Femoral-popliteal bypass graft  06/17/2012   Procedure: BYPASS GRAFT FEMORAL-POPLITEAL ARTERY;  Surgeon: Conrad [place], MD;  Location: [facility] OR;  Service: Vascular;  Laterality: Right;  Using 6 mm x 80 cm propaten goretex graft  . Intraoperative arteriogram  06/17/2012    Procedure: INTRA OPERATIVE ARTERIOGRAM;  Surgeon: Conrad [place], MD;  Location: [place];  Service: Vascular;  Laterality: Right;  . Fasciotomy  06/17/2012    Procedure: FASCIOTOMY;  Surgeon: Conrad [place], MD;  Location: [place];  Service: Vascular;  Laterality: Right;  four compartment  fasciotomy  . Amputation  06/18/2012    Procedure: AMPUTATION ABOVE KNEE;  Surgeon: Mal Misty, MD;  Location: [facility] OR;  Service: Vascular;  Laterality: Right;    No Known Allergies  Current Outpatient Prescriptions  Medication Sig Dispense Refill  . acetaminophen (TYLENOL) 325 MG tablet Take 650 mg by mouth every 6 (six) hours as needed.      Marland Kitchen aspirin 81 MG chewable tablet Chew 81 mg by mouth daily.      Marland Kitchen atenolol (TENORMIN) 50 MG tablet Take 1 tablet (50 mg total) by mouth daily.  30 tablet  0  . citalopram (CELEXA) 10 MG tablet Take 10 mg by mouth daily.      . fosinopril (MONOPRIL) 10 MG tablet Take 1 tablet (10 mg total) by mouth daily. Need appointment for refills.  90 tablet  0  . hydrochlorothiazide (HYDRODIURIL) 12.5 MG tablet Take 1 tablet (12.5 mg total) by mouth daily.  [place]  tablet  0  . Multiple Vitamins-Minerals (CENTURY SENIOR PO) Take 1 tablet by mouth daily.      Marland Kitchen omega-3 acid ethyl esters (LOVAZA) 1 G capsule Take 1 g by mouth 2 (two) times daily.      . pravastatin (PRAVACHOL) 80 MG tablet Take 1 tablet (80 mg total) by mouth daily.  30 tablet  0  . vitamin C (ASCORBIC ACID) 500 MG tablet Take 250 mg by mouth daily.      . vitamin E 400 UNIT capsule Take 400 Units by mouth daily.       No current facility-administered medications for this visit.    History   Social History  . Marital Status: Married    Spouse Name: N/A    Number of Children: N/A  . Years of  Education: N/A   Occupational History  . Not on file.   Social History Main Topics  . Smoking status: Former Smoker -- 1.00 packs/day    Types: Cigarettes  . Smokeless tobacco: Never Used  . Alcohol Use: No  . Drug Use: No  . Sexual Activity: Not on file   Other Topics Concern  . Not on file   Social History Narrative  . No narrative on file    Social he is notable that he is married has 2 children one grandchild. There is a remote tobacco history having quit last year   ROS General: Negative; No fevers, chills, or night sweats;  HEENT: Negative; No changes in vision or hearing, sinus congestion, difficulty swallowing Pulmonary: Negative; No cough, wheezing, shortness of breath, hemoptysis Cardiovascular: Negative; No chest pain, presyncope, syncope, palpatations GI: Negative; No nausea, vomiting, diarrhea, or abdominal pain GU: Negative; No dysuria, hematuria, or difficulty voiding Musculoskeletal: Positive for right above-the-knee amputation with prosthesis; no myalgias, joint pain, or weakness Hematologic/Oncology: Negative; no easy bruising, bleeding Endocrine: Negative; no heat/cold intolerance; no diabetes Neuro: Negative; no changes in balance, headaches Skin: Negative; No rashes or skin lesions Psychiatric: Positive for depression, which has improved with Celexa No behavioral problems,  Sleep: Negative; No snoring, daytime sleepiness, hypersomnolence, bruxism, restless legs, hypnogognic hallucinations, no cataplexy Other comprehensive 14 point system review is negative.  PE BP 110/70  Pulse 73  Ht 5\' 11"  (1.803 m)  Wt 202 lb 14.4 oz (92.035 kg)  BMI 28.31 kg/m2  General: Alert, oriented, no distress.  Skin: normal turgor, no rashes HEENT: Normocephalic, atraumatic. Pupils round and reactive; sclera anicteric;no lid lag.  Nose without nasal septal hypertrophy Mouth/Parynx benign; Mallinpatti scale 3 Neck: No JVD, no carotid bruits .  Normal carotid  upstroke Lungs: Decreased breath sounds; no wheezing or rales Chest: Nontender to palpation Heart: MI not displaced RRR, s1 s2 normal 1/6 systolic murmur.  No S3 or S4 gallop.  No diastolic murmurs, rubs, thrills or heaves. Abdomen: Well-healed abdominal scar from his aneurysm surgery;soft, nontender; no hepatosplenomehaly, BS+; abdominal aorta nontender and not dilated by palpation. Back: No CVA tenderness Pulses 2+ Extremities: Status post right AKA; trace left ankle edema, no clubbing cyanosis , Homan's sign negative  Neurologic: grossly nonfocal  ECG entheses independently read by me): Normal sinus rhythm at 73 beats per minute.  Right bundle branch block with repolarization changes.  Isolated PVC.  Prior 03/24/2013 ECG: Normal sinus rhythm with right bundle branch block and probable left anterior fascicular block  LABS:  BMET    Component Value Date/Time   NA 137 06/23/2012 0509   K 3.4* 06/23/2012 0509   CL 99 06/23/2012  0509   CO2 29 06/23/2012 0509   GLUCOSE 107* 06/23/2012 0509   BUN 11 06/23/2012 0509   CREATININE 0.60 06/23/2012 0509   CALCIUM 8.5 06/23/2012 0509   GFRNONAA >90 06/23/2012 0509   GFRAA >90 06/23/2012 0509     Hepatic Function Panel     Component Value Date/Time   PROT 5.7* 06/23/2012 0509   ALBUMIN 2.5* 06/23/2012 0509   AST 53* 06/23/2012 0509   ALT 51 06/23/2012 0509   ALKPHOS 59 06/23/2012 0509   BILITOT 0.9 06/23/2012 0509     CBC    Component Value Date/Time   WBC 8.5 06/23/2012 0509   RBC 3.69* 06/23/2012 0509   HGB 11.9* 06/23/2012 0509   HCT 34.4* 06/23/2012 0509   PLT 200 06/23/2012 0509   MCV 93.2 06/23/2012 0509   MCH 32.2 06/23/2012 0509   MCHC 34.6 06/23/2012 0509   RDW 13.0 06/23/2012 0509   LYMPHSABS 1.3 06/23/2012 0509   MONOABS 1.0 06/23/2012 0509   EOSABS 0.3 06/23/2012 0509   BASOSABS 0.0 06/23/2012 0509     BNP No results found for this basename: probnp    Lipid Panel  No results found for this  basename: chol,  trig,  hdl,  cholhdl,  vldl,  ldlcalc     RADIOLOGY: No results found.    ASSESSMENT AND PLAN: [name] has known coronary and  is status post intervention to his right coronary artery and has known total occlusion to a circumflex with disease in his LAD. His last nuclear perfusion study did show inferior to inferolateral scar concordant with a circumflex occlusion.  He does have a history of hypertension.  Blood pressure today is well controlled on his medical regimen consisting of atenolol, Monopril, and hydrochlorothiazide.  There is just trivial left ankle edema.  He is on pravastatin.  In addition to localize a 4 hyperlipidemia.  He is doing well with reference to his right leg prosthesis but his walking has been limited.  I am recommending followup laboratory be obtained in the fasting state.  Adjustments will be made to his medical regimen if necessary.  As long as he remains well, I will see him in 6 months for cardiology reevaluation or sooner if problems arise.      Troy Sine, MD, [facility]  12/22/2013 8:38 PM

## 2013-12-27 DIAGNOSIS — R5381 Other malaise: Secondary | ICD-10-CM | POA: Diagnosis not present

## 2013-12-27 DIAGNOSIS — I2510 Atherosclerotic heart disease of native coronary artery without angina pectoris: Secondary | ICD-10-CM | POA: Diagnosis not present

## 2013-12-27 DIAGNOSIS — I10 Essential (primary) hypertension: Secondary | ICD-10-CM | POA: Diagnosis not present

## 2013-12-27 DIAGNOSIS — E782 Mixed hyperlipidemia: Secondary | ICD-10-CM | POA: Diagnosis not present

## 2013-12-27 LAB — CBC
HCT: 45.5 % (ref 39.0–52.0)
Hemoglobin: 15.8 g/dL (ref 13.0–17.0)
MCH: 31.9 pg (ref 26.0–34.0)
MCHC: 34.7 g/dL (ref 30.0–36.0)
MCV: 91.9 fL (ref 78.0–100.0)
Platelets: 187 10*3/uL (ref 150–400)
RBC: 4.95 MIL/uL (ref 4.22–5.81)
RDW: 14.1 % (ref 11.5–15.5)
WBC: 6.4 10*3/uL (ref 4.0–10.5)

## 2013-12-27 LAB — LIPID PANEL
CHOL/HDL RATIO: 3.2 ratio
CHOLESTEROL: 136 mg/dL (ref 0–200)
HDL: 42 mg/dL (ref >39)
LDL Cholesterol: 58 mg/dL (ref 0–99)
Triglycerides: 179 mg/dL — ABNORMAL HIGH (ref <150)
VLDL: 36 mg/dL (ref 0–40)

## 2013-12-27 LAB — COMPREHENSIVE METABOLIC PANEL
ALT: 17 U/L (ref 0–53)
AST: 24 U/L (ref 0–37)
Albumin: 4 g/dL (ref 3.5–5.2)
Alkaline Phosphatase: 53 U/L (ref 39–117)
BILIRUBIN TOTAL: 0.9 mg/dL (ref 0.2–1.2)
BUN: 14 mg/dL (ref 6–23)
CHLORIDE: 101 meq/L (ref 96–112)
CO2: 32 mEq/L (ref 19–32)
Calcium: 9.5 mg/dL (ref 8.4–10.5)
Creat: 0.94 mg/dL (ref 0.50–1.35)
Glucose, Bld: 89 mg/dL (ref 70–99)
Potassium: 4 mEq/L (ref 3.5–5.3)
SODIUM: 141 meq/L (ref 135–145)
Total Protein: 6.8 g/dL (ref 6.0–8.3)

## 2013-12-27 LAB — TSH: TSH: 1.378 u[IU]/mL (ref 0.350–4.500)

## 2013-12-28 ENCOUNTER — Other Ambulatory Visit: Payer: Self-pay | Admitting: *Deleted

## 2013-12-28 MED ORDER — FOSINOPRIL SODIUM 10 MG PO TABS: 10.0000 mg | ORAL_TABLET | Freq: Every day | ORAL | Status: DC

## 2014-01-17 ENCOUNTER — Other Ambulatory Visit: Payer: Self-pay | Admitting: *Deleted

## 2014-01-17 ENCOUNTER — Encounter: Payer: Self-pay | Admitting: *Deleted

## 2014-01-17 MED ORDER — HYDROCHLOROTHIAZIDE 12.5 MG PO TABS: 12.5000 mg | ORAL_TABLET | Freq: Every day | ORAL | Status: DC

## 2014-01-17 MED ORDER — ATENOLOL 50 MG PO TABS: 50.0000 mg | ORAL_TABLET | Freq: Every day | ORAL | Status: DC

## 2014-01-17 NOTE — Telephone Encounter (Signed)
Rx refills sent to patient pharmacy

## 2014-01-19 ENCOUNTER — Other Ambulatory Visit: Payer: Self-pay

## 2014-01-19 MED ORDER — HYDROCHLOROTHIAZIDE 12.5 MG PO TABS: 12.5000 mg | ORAL_TABLET | Freq: Every day | ORAL | Status: DC

## 2014-01-19 NOTE — Telephone Encounter (Signed)
Rx was sent to pharmacy electronically. Patient requested 90-days supply.

## 2014-01-20 ENCOUNTER — Other Ambulatory Visit: Payer: Self-pay | Admitting: *Deleted

## 2014-01-20 MED ORDER — ATENOLOL 50 MG PO TABS: 50.0000 mg | ORAL_TABLET | Freq: Every day | ORAL | Status: DC

## 2014-01-20 NOTE — Telephone Encounter (Signed)
Rx was sent to pharmacy electronically. 

## 2014-02-16 DIAGNOSIS — F329 Major depressive disorder, single episode, unspecified: Secondary | ICD-10-CM | POA: Diagnosis not present

## 2014-02-16 DIAGNOSIS — I714 Abdominal aortic aneurysm, without rupture, unspecified: Secondary | ICD-10-CM | POA: Diagnosis not present

## 2014-02-16 DIAGNOSIS — I739 Peripheral vascular disease, unspecified: Secondary | ICD-10-CM | POA: Diagnosis not present

## 2014-02-16 DIAGNOSIS — I10 Essential (primary) hypertension: Secondary | ICD-10-CM | POA: Diagnosis not present

## 2014-02-16 DIAGNOSIS — F3289 Other specified depressive episodes: Secondary | ICD-10-CM | POA: Diagnosis not present

## 2014-02-16 DIAGNOSIS — Z0000 Encounter for general adult medical examination without abnormal findings: Secondary | ICD-10-CM | POA: Diagnosis not present

## 2014-02-16 DIAGNOSIS — I2510 Atherosclerotic heart disease of native coronary artery without angina pectoris: Secondary | ICD-10-CM | POA: Diagnosis not present

## 2014-02-16 DIAGNOSIS — E7800 Pure hypercholesterolemia, unspecified: Secondary | ICD-10-CM | POA: Diagnosis not present

## 2014-02-16 DIAGNOSIS — Z1331 Encounter for screening for depression: Secondary | ICD-10-CM | POA: Diagnosis not present

## 2014-02-23 ENCOUNTER — Other Ambulatory Visit: Payer: Self-pay | Admitting: *Deleted

## 2014-02-23 MED ORDER — ATENOLOL 50 MG PO TABS: 50.0000 mg | ORAL_TABLET | Freq: Every day | ORAL | Status: DC

## 2014-02-23 NOTE — Telephone Encounter (Signed)
Rx was sent to pharmacy electronically. 

## 2014-03-16 ENCOUNTER — Other Ambulatory Visit: Payer: Self-pay

## 2014-03-16 MED ORDER — PRAVASTATIN SODIUM 80 MG PO TABS: 80.0000 mg | ORAL_TABLET | Freq: Every day | ORAL | Status: DC

## 2014-03-16 NOTE — Telephone Encounter (Signed)
Rx was sent to pharmacy electronically. 

## 2014-03-17 ENCOUNTER — Other Ambulatory Visit: Payer: Self-pay | Admitting: *Deleted

## 2014-03-17 MED ORDER — HYDROCHLOROTHIAZIDE 12.5 MG PO TABS: 12.5000 mg | ORAL_TABLET | Freq: Every day | ORAL | Status: DC

## 2014-03-17 NOTE — Telephone Encounter (Signed)
erx sent to [facility] hctz

## 2014-04-17 ENCOUNTER — Other Ambulatory Visit: Payer: Self-pay

## 2014-04-17 MED ORDER — PRAVASTATIN SODIUM 80 MG PO TABS: 80.0000 mg | ORAL_TABLET | Freq: Every day | ORAL | Status: DC

## 2014-04-17 NOTE — Telephone Encounter (Signed)
Rx was sent to pharmacy electronically. 

## 2014-05-31 DIAGNOSIS — Z23 Encounter for immunization: Secondary | ICD-10-CM | POA: Diagnosis not present

## 2014-06-08 DIAGNOSIS — K529 Noninfective gastroenteritis and colitis, unspecified: Secondary | ICD-10-CM | POA: Diagnosis not present

## 2014-06-08 DIAGNOSIS — R195 Other fecal abnormalities: Secondary | ICD-10-CM | POA: Diagnosis not present

## 2014-06-21 ENCOUNTER — Ambulatory Visit (INDEPENDENT_AMBULATORY_CARE_PROVIDER_SITE_OTHER): Payer: Medicare Other | Admitting: Cardiovascular Disease

## 2014-06-21 ENCOUNTER — Encounter: Payer: Self-pay | Admitting: Cardiovascular Disease

## 2014-06-21 VITALS — BP 120/80 | HR 76 | Ht 71.0 in | Wt 207.3 lb

## 2014-06-21 DIAGNOSIS — I10 Essential (primary) hypertension: Secondary | ICD-10-CM | POA: Diagnosis not present

## 2014-06-21 DIAGNOSIS — Z9889 Other specified postprocedural states: Secondary | ICD-10-CM

## 2014-06-21 DIAGNOSIS — I4510 Unspecified right bundle-branch block: Secondary | ICD-10-CM | POA: Diagnosis not present

## 2014-06-21 DIAGNOSIS — I739 Peripheral vascular disease, unspecified: Secondary | ICD-10-CM

## 2014-06-21 DIAGNOSIS — E785 Hyperlipidemia, unspecified: Secondary | ICD-10-CM | POA: Diagnosis not present

## 2014-06-21 DIAGNOSIS — I2510 Atherosclerotic heart disease of native coronary artery without angina pectoris: Secondary | ICD-10-CM | POA: Diagnosis not present

## 2014-06-21 NOTE — Patient Instructions (Signed)
Your physician wants you to follow-up in: 6 months or sooner if needed with Dr. Kelly. You will receive a reminder letter in the mail two months in advance. If you don't receive a letter, please call our office to schedule the follow-up appointment. No changes were made today in your therapy. 

## 2014-06-21 NOTE — Progress Notes (Signed)
Patient ID: Stephen Dean, male   DOB: 1929-05-10, 80 y.o.   MRN: [id number]      HPI: Stephen Dean is a 80 y.o. male who presents to the office today for 82-month cardiology evaluation.  Stephen Dean has known coronary as well as peripheral vascular disease. In February 1999 he underwent PTCA of a long 70-80% RCA stenosis. At that time he had total occlusion of the circumflex and segmental stenoses of his LAD. He is status post abdominal aortic aneurysm repair and right iliac artery aneurysm repair by Dr. Amedeo Plenty. He has a history of right bundle branch block, hypertension, hyperlipidemia. He required right anterior tibial artery thrombectomy in October 2013 and subsequently underwent right above-the-knee amputation by Dr. Kellie Simmering. His last nuclear perfusion study in August 2013 was unchanged from previously and showed the old scar in the inferior wall.   Since I last saw him his in April 2015, he has remained fairly stable.  He is on atenolol 50 mg, and HCTZ 12.5 mg for hypertension.  He had developed a dry, hacking cough and has resolved.  His Monopril was discontinued.  He is unaware of any significant blood pressure elevation since he no longer is taking this therapy.  He is on Pravachol 80 mg and Lovaza 1 g twice a day for hyperlipidemia.  He takes Celexa 10 mg for depression.  His ambulation is limited secondary to his right leg prosthesis.  He does note some trivial left ankle edema.  He denies recurrent anginal symptoms.  He denies palpitations.  He denies presyncope or syncope.  He has chronic right bundle branch block.  Past Medical History  Diagnosis Date  . Peripheral vascular disease   . Heart disease   . Hyperlipidemia   . Hypertension     Past Surgical History  Procedure Laterality Date  . Hernia repair    . Cardiac surgery      Stent placement x 2  . Embolectomy  06/17/2012    Procedure: EMBOLECTOMY;  Surgeon: Conrad [place], MD;  Location: [place];  Service: Vascular;   Laterality: Right;  Thrombectomy of right tibial arteries,exclusion of right popliteal aneurysm, femoral -popliteal artery bypass graft,four compartment fasciotomies  . Femoral-popliteal bypass graft  06/17/2012    Procedure: BYPASS GRAFT FEMORAL-POPLITEAL ARTERY;  Surgeon: Conrad Isabela, MD;  Location: [facility] OR;  Service: Vascular;  Laterality: Right;  Using 6 mm x 80 cm propaten goretex graft  . Intraoperative arteriogram  06/17/2012    Procedure: INTRA OPERATIVE ARTERIOGRAM;  Surgeon: Conrad Tutwiler, MD;  Location: [place];  Service: Vascular;  Laterality: Right;  . Fasciotomy  06/17/2012    Procedure: FASCIOTOMY;  Surgeon: Conrad [place], MD;  Location: [place];  Service: Vascular;  Laterality: Right;  four compartment  fasciotomy  . Amputation  06/18/2012    Procedure: AMPUTATION ABOVE KNEE;  Surgeon: Mal Misty, MD;  Location: [facility] OR;  Service: Vascular;  Laterality: Right;    No Known Allergies  Current Outpatient Prescriptions  Medication Sig Dispense Refill  . acetaminophen (TYLENOL) 325 MG tablet Take 650 mg by mouth every 6 (six) hours as needed.      Marland Kitchen aspirin 81 MG chewable tablet Chew 81 mg by mouth daily.      Marland Kitchen atenolol (TENORMIN) 50 MG tablet Take 1 tablet (50 mg total) by mouth daily.  30 tablet  9  . citalopram (CELEXA) 10 MG tablet Take 10 mg by mouth daily.      Marland Kitchen  hydrochlorothiazide (HYDRODIURIL) 12.5 MG tablet Take 1 tablet (12.5 mg total) by mouth daily.  90 tablet  3  . Multiple Vitamins-Minerals (CENTURY SENIOR PO) Take 1 tablet by mouth daily.      Marland Kitchen omega-3 acid ethyl esters (LOVAZA) 1 G capsule Take 1 g by mouth 2 (two) times daily.      . pravastatin (PRAVACHOL) 80 MG tablet Take 1 tablet (80 mg total) by mouth daily.  90 tablet  2  . vitamin C (ASCORBIC ACID) 500 MG tablet Take 250 mg by mouth daily.      . vitamin E 400 UNIT capsule Take 400 Units by mouth daily.       No current facility-administered medications for this visit.    History   Social History  .  Marital Status: Married    Spouse Name: N/A    Number of Children: N/A  . Years of Education: N/A   Occupational History  . Not on file.   Social History Main Topics  . Smoking status: Former Smoker -- 1.00 packs/day    Types: Cigarettes  . Smokeless tobacco: Never Used  . Alcohol Use: No  . Drug Use: No  . Sexual Activity: Not on file   Other Topics Concern  . Not on file   Social History Narrative  . No narrative on file    Socially  he is married has 2 children one grandchild. There is a remote tobacco history having quit last year   ROS General: Negative; No fevers, chills, or night sweats;  HEENT: Negative; No changes in vision or hearing, sinus congestion, difficulty swallowing Pulmonary: Negative; No cough, wheezing, shortness of breath, hemoptysis Cardiovascular: Negative; No chest pain, presyncope, syncope, palpatations GI: Negative; No nausea, vomiting, diarrhea, or abdominal pain GU: Negative; No dysuria, hematuria, or difficulty voiding Musculoskeletal: Positive for right above-the-knee amputation with prosthesis; no myalgias, joint pain, or weakness Hematologic/Oncology: Negative; no easy bruising, bleeding Endocrine: Negative; no heat/cold intolerance; no diabetes Neuro: Negative; no changes in balance, headaches Skin: Negative; No rashes or skin lesions Psychiatric: Positive for depression, which has improved with Celexa No behavioral problems,  Sleep: Negative; No snoring, daytime sleepiness, hypersomnolence, bruxism, restless legs, hypnogognic hallucinations, no cataplexy Other comprehensive 14 point system review is negative.  PE BP 120/80  Pulse 76  Ht 5\' 11"  (1.803 m)  Wt 207 lb 4.8 oz (94.031 kg)  BMI 28.93 kg/m2  General: Alert, oriented, no distress.  Skin: normal turgor, no rashes HEENT: Normocephalic, atraumatic. Pupils round and reactive; sclera anicteric;no lid lag.  Nose without nasal septal hypertrophy Mouth/Parynx benign; Mallinpatti  scale 3 Neck: No JVD, no carotid bruits .  Normal carotid upstroke Lungs: Decreased breath sounds; no wheezing or rales Chest: Nontender to palpation Heart: MI not displaced RRR, s1 s2 normal 1/6 systolic murmur.  No S3 or S4 gallop.  No diastolic murmurs, rubs, thrills or heaves. Abdomen: Well-healed abdominal scar from his aneurysm surgery;soft, nontender; no hepatosplenomehaly, BS+; abdominal aorta nontender and not dilated by palpation. Back: No CVA tenderness Pulses 2+ Extremities: Status post right AKA; trace left ankle edema, no clubbing cyanosis , Homan's sign negative  Neurologic: grossly nonfocal  ECG (independently read by me): Normal sinus rhythm at 76 bpm.   Right bundle branch block with repolarization changes.  No ectopy.  Prior April 2015 ECG : Normal sinus rhythm at 73 beats per minute.  Right bundle branch block with repolarization changes.  Isolated PVC.  Prior 03/24/2013 ECG: Normal sinus rhythm with right  bundle branch block and probable left anterior fascicular block  LABS:  BMET    Component Value Date/Time   NA 141 12/27/2013 0954   K 4.0 12/27/2013 0954   CL 101 12/27/2013 0954   CO2 32 12/27/2013 0954   GLUCOSE 89 12/27/2013 0954   BUN 14 12/27/2013 0954   CREATININE 0.94 12/27/2013 0954   CREATININE 0.60 06/23/2012 0509   CALCIUM 9.5 12/27/2013 0954   GFRNONAA >90 06/23/2012 0509   GFRAA >90 06/23/2012 0509     Hepatic Function Panel     Component Value Date/Time   PROT 6.8 12/27/2013 0954   ALBUMIN 4.0 12/27/2013 0954   AST 24 12/27/2013 0954   ALT 17 12/27/2013 0954   ALKPHOS 53 12/27/2013 0954   BILITOT 0.9 12/27/2013 0954     CBC    Component Value Date/Time   WBC 6.4 12/27/2013 0954   RBC 4.95 12/27/2013 0954   HGB 15.8 12/27/2013 0954   HCT 45.5 12/27/2013 0954   PLT 187 12/27/2013 0954   MCV 91.9 12/27/2013 0954   MCH 31.9 12/27/2013 0954   MCHC 34.7 12/27/2013 0954   RDW 14.1 12/27/2013 0954   LYMPHSABS 1.3 06/23/2012 0509   MONOABS 1.0 06/23/2012 0509   EOSABS 0.3  06/23/2012 0509   BASOSABS 0.0 06/23/2012 0509     BNP No results found for this basename: probnp    Lipid Panel     Component Value Date/Time   CHOL 136 12/27/2013 0954     RADIOLOGY: No results found.    ASSESSMENT AND PLAN: Stephen Dean has known coronary and  is status post intervention to his RCA and has known total occlusion to a circumflex with disease in his LAD. His last nuclear perfusion study demonstrated inferior to inferolateral scar concordant with a circumflex occlusion.  He does have a history of hypertension.  Blood pressure today is well controlled on his atenolol 50 mg daily and HCTZ 12.5 mg.  He no longer is taking Monopril and his blood pressure remained stable.  He developed an ACE induced cough which has resolved with discontinuance of therapy.  He is on pravastatin and low positive for hyperlipidemia.  His depression is controlled with Celexa 10 mg.  He has chronic right bundle branch block.  He is not having recurrent anginal symptoms or significant shortness of breath, but his exercise is limited due to his prosthesis.  He tells me he'll be seeing Dr. Delfina Redwood in 1 month and aboratory will be checked.  As long as he is stable, I will see him in 6 months for reevaluation.   Troy Sine, MD, [facility]  06/21/2014 11:07 AM

## 2014-06-23 ENCOUNTER — Encounter: Payer: Self-pay | Admitting: Cardiovascular Disease

## 2014-06-23 DIAGNOSIS — I4510 Unspecified right bundle-branch block: Secondary | ICD-10-CM | POA: Insufficient documentation

## 2014-07-11 DIAGNOSIS — R195 Other fecal abnormalities: Secondary | ICD-10-CM | POA: Diagnosis not present

## 2014-08-03 DIAGNOSIS — Z23 Encounter for immunization: Secondary | ICD-10-CM | POA: Diagnosis not present

## 2014-08-03 DIAGNOSIS — R5383 Other fatigue: Secondary | ICD-10-CM | POA: Diagnosis not present

## 2014-08-03 DIAGNOSIS — I739 Peripheral vascular disease, unspecified: Secondary | ICD-10-CM | POA: Diagnosis not present

## 2014-08-03 DIAGNOSIS — I714 Abdominal aortic aneurysm, without rupture: Secondary | ICD-10-CM | POA: Diagnosis not present

## 2014-08-03 DIAGNOSIS — E780 Pure hypercholesterolemia: Secondary | ICD-10-CM | POA: Diagnosis not present

## 2014-08-03 DIAGNOSIS — I10 Essential (primary) hypertension: Secondary | ICD-10-CM | POA: Diagnosis not present

## 2014-08-03 DIAGNOSIS — J449 Chronic obstructive pulmonary disease, unspecified: Secondary | ICD-10-CM | POA: Diagnosis not present

## 2014-08-03 DIAGNOSIS — I2510 Atherosclerotic heart disease of native coronary artery without angina pectoris: Secondary | ICD-10-CM | POA: Diagnosis not present

## 2014-08-08 DIAGNOSIS — R7309 Other abnormal glucose: Secondary | ICD-10-CM | POA: Diagnosis not present

## 2014-09-25 DIAGNOSIS — R319 Hematuria, unspecified: Secondary | ICD-10-CM | POA: Diagnosis not present

## 2014-11-10 ENCOUNTER — Telehealth: Payer: Self-pay | Admitting: Cardiovascular Disease

## 2014-11-10 DIAGNOSIS — R300 Dysuria: Secondary | ICD-10-CM | POA: Diagnosis not present

## 2014-11-13 NOTE — Telephone Encounter (Signed)
Closed encounter °

## 2014-12-14 ENCOUNTER — Ambulatory Visit (INDEPENDENT_AMBULATORY_CARE_PROVIDER_SITE_OTHER): Payer: Medicare Other | Admitting: Cardiovascular Disease

## 2014-12-14 ENCOUNTER — Other Ambulatory Visit: Payer: Self-pay | Admitting: Cardiovascular Disease

## 2014-12-14 ENCOUNTER — Encounter: Payer: Self-pay | Admitting: Cardiovascular Disease

## 2014-12-14 VITALS — BP 122/68 | HR 67 | Ht 71.0 in | Wt 210.5 lb

## 2014-12-14 DIAGNOSIS — I4510 Unspecified right bundle-branch block: Secondary | ICD-10-CM | POA: Diagnosis not present

## 2014-12-14 DIAGNOSIS — I739 Peripheral vascular disease, unspecified: Secondary | ICD-10-CM

## 2014-12-14 DIAGNOSIS — I2510 Atherosclerotic heart disease of native coronary artery without angina pectoris: Secondary | ICD-10-CM | POA: Diagnosis not present

## 2014-12-14 DIAGNOSIS — I10 Essential (primary) hypertension: Secondary | ICD-10-CM

## 2014-12-14 DIAGNOSIS — E785 Hyperlipidemia, unspecified: Secondary | ICD-10-CM | POA: Diagnosis not present

## 2014-12-14 NOTE — Patient Instructions (Signed)
Your physician recommends that you return for lab work fasting.  Your physician wants you to follow-up in: 6 months or sooner if needed. You will receive a reminder letter in the mail two months in advance. If you don't receive a letter, please call our office to schedule the follow-up appointment. 

## 2014-12-14 NOTE — Telephone Encounter (Signed)
Rx(s) sent to pharmacy electronically.  

## 2014-12-16 ENCOUNTER — Encounter: Payer: Self-pay | Admitting: Cardiovascular Disease

## 2014-12-16 NOTE — Progress Notes (Signed)
Patient ID: Stephen Dean, male   DOB: [birth year], 81 y.o.   MRN: [id number]      HPI: Stephen Dean is a 81 y.o. male who presents to the office today for 53-monthcardiology evaluation.  Stephen Dean known coronary as well as peripheral vascular disease. In February 1999 he underwent PTCA of a long 70-80% RCA stenosis and had total occlusion of the circumflex and segmental stenoses of his LAD. He is status post abdominal aortic aneurysm repair and right iliac artery aneurysm repair by Stephen Dean He has a history of right bundle branch block, hypertension, hyperlipidemia. He required right anterior tibial artery thrombectomy in October 2013 and subsequently underwent right above-the-knee amputation by Stephen Dean His last nuclear perfusion study in August 2013 was unchanged from previously and showed the old scar in the inferior wall.   Since I last saw him his in October 2015, he has remained fairly stable.  He is on atenolol 50 mg, HCTZ 12.5 mg, and losartan 25 mg for hypertension.  Previously, he developed an ACE-induced cough.  He is on Pravachol 80 mg and Lovaza 1 g twice a day for hyperlipidemia.  He takes Celexa 10 mg for depression.  His ambulation is limited secondary to his right leg prosthesis.  He denies recurrent anginal symptoms.  He denies palpitations.  He denies presyncope or syncope.  He has chronic right bundle branch block.  At times there is trivial left pedal edema.   Past Medical History  Diagnosis Date  . Peripheral vascular disease   . Heart disease   . Hyperlipidemia   . Hypertension     Past Surgical History  Procedure Laterality Date  . Hernia repair    . Cardiac surgery      Stent placement x 2  . Embolectomy  06/17/2012    Procedure: EMBOLECTOMY;  Surgeon: Stephen Morris MD;  [place]  Service: Vascular;  Laterality: Right;  Thrombectomy of right tibial arteries,exclusion of right popliteal aneurysm, femoral -popliteal artery bypass  graft,four compartment fasciotomies  . Femoral-popliteal bypass graft  06/17/2012    Procedure: BYPASS GRAFT FEMORAL-POPLITEAL ARTERY;  Surgeon: Stephen Gotha MD;  [facility]  Service: Vascular;  Laterality: Right;  Using 6 mm x 80 cm propaten goretex graft  . Intraoperative arteriogram  06/17/2012    Procedure: INTRA OPERATIVE ARTERIOGRAM;  Surgeon: Stephen Chesaning MD;  [place]  Service: V[place];  Laterality: Right;  . Fasciotomy  06/17/2012    Procedure: FASCIOTOMY;  Surgeon: BConrad  MD;  [place] [facility]  Service: Vascular;  Laterality: Right;  four compartment  fasciotomy  . Amputation  06/18/2012    Procedure: AMPUTATION ABOVE KNEE;  Surgeon: Stephen Misty MD;  [facility]  Service: V[facility];  Laterality: Right;    No Known Allergies  Current Outpatient Prescriptions  Medication Sig Dispense Refill  . acetaminophen (TYLENOL) 325 MG tablet Take 650 mg by mouth every 6 (six) hours as needed.    .Marland Kitchenaspirin 81 MG chewable tablet Chew 81 mg by mouth daily.    .Marland Kitchenatenolol (TENORMIN) 50 MG tablet Take 1 tablet (50 mg total) by mouth daily. 30 tablet 9  . citalopram (CELEXA) 10 MG tablet Take 10 mg by mouth daily.    . hydrochlorothiazide (HYDRODIURIL) 12.5 MG tablet Take 1 tablet (12.5 mg total) by mouth daily. 90 tablet 3  . Multiple Vitamins-Minerals (CENTURY SENIOR PO) Take 1 tablet by mouth daily.    .Marland Kitchen  omega-3 acid ethyl esters (LOVAZA) 1 G capsule Take 1 g by mouth 2 (two) times daily.    . pravastatin (PRAVACHOL) 80 MG tablet TAKE 1 TABLET BY MOUTH DAILY. 90 tablet 1  . losartan (COZAAR) 25 MG tablet Take 25 mg by mouth daily.  3  . vitamin C (ASCORBIC ACID) 500 MG tablet Take 250 mg by mouth daily.    . vitamin E 400 UNIT capsule Take 400 Units by mouth daily.     No current facility-administered medications for this visit.    History   Social History  . Marital Status: Married    Spouse Name: N/A  . Number of Children: N/A  . Years of Education: N/A     Occupational History  . Not on file.   Social History Main Topics  . Smoking status: Former Smoker -- 1.00 packs/day    Types: Cigarettes  . Smokeless tobacco: Never Used  . Alcohol Use: No  . Drug Use: No  . Sexual Activity: Not on file   Other Topics Concern  . Not on file   Social History Narrative    Socially  he is married has 2 children one grandchild. There is a remote tobacco history having quit last year   ROS General: Negative; No fevers, chills, or night sweats;  HEENT: Positive for difficulty hearing for which he uses hearing aids.  No change in vision., sinus congestion, difficulty swallowing Pulmonary: Negative; No cough, wheezing, shortness of breath, hemoptysis Cardiovascular: Negative; No chest pain, presyncope, syncope, palpatations GI: Negative; No nausea, vomiting, diarrhea, or abdominal pain GU: Negative; No dysuria, hematuria, or difficulty voiding Musculoskeletal: Positive for right above-the-knee amputation with prosthesis; no myalgias, joint pain, or weakness Hematologic/Oncology: Negative; no easy bruising, bleeding Endocrine: Negative; no heat/cold intolerance; no diabetes Neuro: Negative; no changes in balance, headaches Skin: Negative; No rashes or skin lesions Psychiatric: Positive for depression, which has improved with Celexa No behavioral problems,  Sleep: Negative; No snoring, daytime sleepiness, hypersomnolence, bruxism, restless legs, hypnogognic hallucinations, no cataplexy Other comprehensive 14 point system review is negative.  PE BP 122/68 mmHg  Pulse 67  Ht 5' 11"  (1.803 m)  Wt 210 lb 8 oz (95.482 kg)  BMI 29.37 kg/m2   Wt Readings from Last 3 Encounters:  12/14/14 210 lb 8 oz (95.482 kg)  06/21/14 207 lb 4.8 oz (94.031 kg)  12/22/13 202 lb 14.4 oz (92.035 kg)   General: Alert, oriented, no distress.  Skin: normal turgor, no rashes HEENT: Normocephalic, atraumatic. Pupils round and reactive; sclera anicteric;no lid lag.   Nose without nasal septal hypertrophy Mouth/Parynx benign; Mallinpatti scale 3 Neck: No JVD, no carotid bruits .  Normal carotid upstroke Lungs: Decreased breath sounds; no wheezing or rales Chest: Nontender to palpation Heart: MI not displaced RRR, s1 s2 normal 1/6 systolic murmur.  No S3 or S4 gallop.  No diastolic murmurs, rubs, thrills or heaves. Abdomen: Well-healed abdominal scar from his aneurysm surgery;soft, nontender; no hepatosplenomehaly, BS+; abdominal aorta nontender and not dilated by palpation. Back: No CVA tenderness Pulses 2+ Extremities: Status post right AKA; trace left ankle edema, no clubbing cyanosis , Homan's sign negative  Neurologic: grossly nonfocal Psychological: Normal affect and mood.  ECG (independently read by me): Normal sinus rhythm at 67 bpm.  Right bundle branch block with repolarization changes.  October 2015 ECG (independently read by me): Normal sinus rhythm at 76 bpm.   Right bundle branch block with repolarization changes.  No ectopy.  Prior April 2015 ECG :  Normal sinus rhythm at 73 beats per minute.  Right bundle branch block with repolarization changes.  Isolated PVC.  Prior 03/24/2013 ECG: Normal sinus rhythm with right bundle branch block and probable left anterior fascicular block  LABS:  BMET  BMP Latest Ref Rng 12/27/2013 06/23/2012 06/20/2012  Glucose 70 - 99 mg/dL 89 107(H) 100(H)  BUN 6 - 23 mg/dL 14 11 10   Creatinine 0.50 - 1.35 mg/dL 0.94 0.60 0.62  Sodium 135 - 145 mEq/L 141 137 135  Potassium 3.5 - 5.3 mEq/L 4.0 3.4(L) 3.4(L)  Chloride 96 - 112 mEq/L 101 99 96  CO2 19 - 32 mEq/L 32 29 33(H)  Calcium 8.4 - 10.5 mg/dL 9.5 8.5 8.3(L)     Hepatic Function Latest Ref Rng 12/27/2013 06/23/2012 06/18/2012  Total Protein 6.0 - 8.3 g/dL 6.8 5.7(L) 5.3(L)  Albumin 3.5 - 5.2 g/dL 4.0 2.5(L) 3.0(L)  AST 0 - 37 U/L 24 53(H) 547(H)  ALT 0 - 53 U/L 17 51 111(H)  Alk Phosphatase 39 - 117 U/L 53 59 44  Total Bilirubin 0.2 - 1.2 mg/dL 0.9  0.9 1.4(H)    CBC Latest Ref Rng 12/27/2013 06/23/2012 06/20/2012  WBC 4.0 - 10.5 K/uL 6.4 8.5 11.9(H)  Hemoglobin 13.0 - 17.0 g/dL 15.8 11.9(L) 11.7(L)  Hematocrit 39.0 - 52.0 % 45.5 34.4(L) 33.9(L)  Platelets 150 - 400 K/uL 187 200 109(L)   Lab Results  Component Value Date   TSH 1.378 12/27/2013   BNP No results found for: PROBNP   Lipid Panel     Component Value Date/Time   CHOL 136 12/27/2013 0954   TRIG 179* 12/27/2013 0954   HDL 42 12/27/2013 0954   CHOLHDL 3.2 12/27/2013 0954   VLDL 36 12/27/2013 0954   LDLCALC 58 12/27/2013 0954     RADIOLOGY: No results found.    ASSESSMENT AND PLAN: Stephen Dean has known CAD and is status post intervention to his RCA and has known total occlusion to a circumflex with disease in his LAD. His last nuclear perfusion study demonstrated inferior to inferolateral scar concordant with a circumflex occlusion.  He has a history of hypertension.  Blood pressure today is well controlled on his atenolol 50 mg daily, HCTZ 12.5 mg and low-dose losartan at 25 mg..  He no longer is taking Monopril and his blood pressure remained stable.  He developed an ACE induced cough which has resolved with discontinuance of therapy.  He is on pravastatin and low positive for hyperlipidemia.  His last lipid profile one year ago showed excellent LDL at 58.  His depression is controlled with Celexa 10 mg.  He has chronic right bundle branch block.  He is not having recurrent anginal symptoms or significant shortness of breath, but his exercise is limited due to his prosthesis.  He'll continue with current medical therapy as prescribed.  As long as he remains stable, I will see him in 6 months for reevaluation.  Follow-up laboratory will be obtained by Dr. Delfina Redwood and I will review this when available.  Time spent: 25 minutes  Stephen Sine, MD, [facility]  12/16/2014 10:54 AM

## 2014-12-18 ENCOUNTER — Ambulatory Visit: Payer: Medicare Other | Admitting: Cardiovascular Disease

## 2014-12-28 DIAGNOSIS — I2510 Atherosclerotic heart disease of native coronary artery without angina pectoris: Secondary | ICD-10-CM | POA: Diagnosis not present

## 2014-12-28 DIAGNOSIS — I10 Essential (primary) hypertension: Secondary | ICD-10-CM | POA: Diagnosis not present

## 2014-12-28 DIAGNOSIS — E785 Hyperlipidemia, unspecified: Secondary | ICD-10-CM | POA: Diagnosis not present

## 2014-12-29 LAB — COMPREHENSIVE METABOLIC PANEL
ALT: 22 U/L (ref 0–53)
AST: 25 U/L (ref 0–37)
Albumin: 4 g/dL (ref 3.5–5.2)
Alkaline Phosphatase: 56 U/L (ref 39–117)
BILIRUBIN TOTAL: 0.8 mg/dL (ref 0.2–1.2)
BUN: 11 mg/dL (ref 6–23)
CO2: 28 mEq/L (ref 19–32)
Calcium: 9.4 mg/dL (ref 8.4–10.5)
Chloride: 100 mEq/L (ref 96–112)
Creat: 0.92 mg/dL (ref 0.50–1.35)
Glucose, Bld: 95 mg/dL (ref 70–99)
Potassium: 3.9 mEq/L (ref 3.5–5.3)
Sodium: 139 mEq/L (ref 135–145)
Total Protein: 6.9 g/dL (ref 6.0–8.3)

## 2014-12-29 LAB — LIPID PANEL
CHOL/HDL RATIO: 3.1 ratio
Cholesterol: 116 mg/dL (ref 0–200)
HDL: 38 mg/dL — ABNORMAL LOW (ref >=40)
LDL CALC: 47 mg/dL (ref 0–99)
Triglycerides: 153 mg/dL — ABNORMAL HIGH (ref <150)
VLDL: 31 mg/dL (ref 0–40)

## 2014-12-29 LAB — CBC
HEMATOCRIT: 46.7 % (ref 39.0–52.0)
Hemoglobin: 16.3 g/dL (ref 13.0–17.0)
MCH: 32.6 pg (ref 26.0–34.0)
MCHC: 34.9 g/dL (ref 30.0–36.0)
MCV: 93.4 fL (ref 78.0–100.0)
MPV: 10.5 fL (ref 8.6–12.4)
PLATELETS: 185 10*3/uL (ref 150–400)
RBC: 5 MIL/uL (ref 4.22–5.81)
RDW: 14.2 % (ref 11.5–15.5)
WBC: 6.7 10*3/uL (ref 4.0–10.5)

## 2015-01-03 ENCOUNTER — Encounter: Payer: Self-pay | Admitting: Cardiovascular Disease

## 2015-01-18 ENCOUNTER — Encounter: Payer: Self-pay | Admitting: *Deleted

## 2015-02-01 DIAGNOSIS — D485 Neoplasm of uncertain behavior of skin: Secondary | ICD-10-CM | POA: Diagnosis not present

## 2015-02-01 DIAGNOSIS — L720 Epidermal cyst: Secondary | ICD-10-CM | POA: Diagnosis not present

## 2015-02-01 DIAGNOSIS — L821 Other seborrheic keratosis: Secondary | ICD-10-CM | POA: Diagnosis not present

## 2015-02-01 DIAGNOSIS — C44511 Basal cell carcinoma of skin of breast: Secondary | ICD-10-CM | POA: Diagnosis not present

## 2015-02-01 DIAGNOSIS — D045 Carcinoma in situ of skin of trunk: Secondary | ICD-10-CM | POA: Diagnosis not present

## 2015-02-01 DIAGNOSIS — C44519 Basal cell carcinoma of skin of other part of trunk: Secondary | ICD-10-CM | POA: Diagnosis not present

## 2015-02-23 DIAGNOSIS — C44619 Basal cell carcinoma of skin of left upper limb, including shoulder: Secondary | ICD-10-CM | POA: Diagnosis not present

## 2015-02-23 DIAGNOSIS — C44519 Basal cell carcinoma of skin of other part of trunk: Secondary | ICD-10-CM | POA: Diagnosis not present

## 2015-02-23 DIAGNOSIS — C44511 Basal cell carcinoma of skin of breast: Secondary | ICD-10-CM | POA: Diagnosis not present

## 2015-02-23 DIAGNOSIS — C44629 Squamous cell carcinoma of skin of left upper limb, including shoulder: Secondary | ICD-10-CM | POA: Diagnosis not present

## 2015-02-23 DIAGNOSIS — D485 Neoplasm of uncertain behavior of skin: Secondary | ICD-10-CM | POA: Diagnosis not present

## 2015-02-27 DIAGNOSIS — F329 Major depressive disorder, single episode, unspecified: Secondary | ICD-10-CM | POA: Diagnosis not present

## 2015-02-27 DIAGNOSIS — I10 Essential (primary) hypertension: Secondary | ICD-10-CM | POA: Diagnosis not present

## 2015-02-27 DIAGNOSIS — Z1389 Encounter for screening for other disorder: Secondary | ICD-10-CM | POA: Diagnosis not present

## 2015-02-27 DIAGNOSIS — J449 Chronic obstructive pulmonary disease, unspecified: Secondary | ICD-10-CM | POA: Diagnosis not present

## 2015-02-27 DIAGNOSIS — I714 Abdominal aortic aneurysm, without rupture: Secondary | ICD-10-CM | POA: Diagnosis not present

## 2015-02-27 DIAGNOSIS — I739 Peripheral vascular disease, unspecified: Secondary | ICD-10-CM | POA: Diagnosis not present

## 2015-02-27 DIAGNOSIS — Z0001 Encounter for general adult medical examination with abnormal findings: Secondary | ICD-10-CM | POA: Diagnosis not present

## 2015-02-27 DIAGNOSIS — E780 Pure hypercholesterolemia: Secondary | ICD-10-CM | POA: Diagnosis not present

## 2015-03-05 DIAGNOSIS — R7309 Other abnormal glucose: Secondary | ICD-10-CM | POA: Diagnosis not present

## 2015-03-14 ENCOUNTER — Other Ambulatory Visit: Payer: Self-pay | Admitting: Cardiovascular Disease

## 2015-03-14 NOTE — Telephone Encounter (Signed)
Rx(s) sent to pharmacy electronically.  

## 2015-03-16 DIAGNOSIS — C4492 Squamous cell carcinoma of skin, unspecified: Secondary | ICD-10-CM | POA: Diagnosis not present

## 2015-03-16 DIAGNOSIS — C4491 Basal cell carcinoma of skin, unspecified: Secondary | ICD-10-CM | POA: Diagnosis not present

## 2015-03-16 DIAGNOSIS — L570 Actinic keratosis: Secondary | ICD-10-CM | POA: Diagnosis not present

## 2015-05-22 DIAGNOSIS — R8299 Other abnormal findings in urine: Secondary | ICD-10-CM | POA: Diagnosis not present

## 2015-05-22 DIAGNOSIS — R5381 Other malaise: Secondary | ICD-10-CM | POA: Diagnosis not present

## 2015-05-22 DIAGNOSIS — Z23 Encounter for immunization: Secondary | ICD-10-CM | POA: Diagnosis not present

## 2015-05-22 DIAGNOSIS — R8290 Unspecified abnormal findings in urine: Secondary | ICD-10-CM | POA: Diagnosis not present

## 2015-05-22 DIAGNOSIS — Z87448 Personal history of other diseases of urinary system: Secondary | ICD-10-CM | POA: Diagnosis not present

## 2015-05-23 ENCOUNTER — Other Ambulatory Visit: Payer: Self-pay | Admitting: Internal Medicine

## 2015-05-23 DIAGNOSIS — R110 Nausea: Secondary | ICD-10-CM

## 2015-05-23 DIAGNOSIS — R7989 Other specified abnormal findings of blood chemistry: Secondary | ICD-10-CM

## 2015-05-23 DIAGNOSIS — R945 Abnormal results of liver function studies: Secondary | ICD-10-CM

## 2015-05-24 ENCOUNTER — Ambulatory Visit
Admission: RE | Admit: 2015-05-24 | Discharge: 2015-05-24 | Disposition: A | Discharge status: Home / Self Care | Payer: Medicare Other | Source: Ambulatory Visit | Attending: Internal Medicine | Admitting: Internal Medicine

## 2015-05-24 DIAGNOSIS — R945 Abnormal results of liver function studies: Secondary | ICD-10-CM

## 2015-05-24 DIAGNOSIS — R7989 Other specified abnormal findings of blood chemistry: Secondary | ICD-10-CM

## 2015-05-24 DIAGNOSIS — R110 Nausea: Secondary | ICD-10-CM

## 2015-05-31 DIAGNOSIS — N390 Urinary tract infection, site not specified: Secondary | ICD-10-CM | POA: Diagnosis not present

## 2015-05-31 DIAGNOSIS — R748 Abnormal levels of other serum enzymes: Secondary | ICD-10-CM | POA: Diagnosis not present

## 2015-05-31 DIAGNOSIS — K8020 Calculus of gallbladder without cholecystitis without obstruction: Secondary | ICD-10-CM | POA: Diagnosis not present

## 2015-06-07 ENCOUNTER — Other Ambulatory Visit: Payer: Self-pay | Admitting: Cardiovascular Disease

## 2015-06-07 NOTE — Telephone Encounter (Signed)
Rx(s) sent to pharmacy electronically.  

## 2015-06-19 ENCOUNTER — Encounter: Payer: Self-pay | Admitting: Cardiovascular Disease

## 2015-06-19 ENCOUNTER — Ambulatory Visit (INDEPENDENT_AMBULATORY_CARE_PROVIDER_SITE_OTHER): Payer: Medicare Other | Admitting: Cardiovascular Disease

## 2015-06-19 VITALS — BP 104/60 | HR 71 | Ht 71.0 in | Wt 209.0 lb

## 2015-06-19 DIAGNOSIS — I10 Essential (primary) hypertension: Secondary | ICD-10-CM

## 2015-06-19 DIAGNOSIS — I4510 Unspecified right bundle-branch block: Secondary | ICD-10-CM | POA: Diagnosis not present

## 2015-06-19 DIAGNOSIS — Z89611 Acquired absence of right leg above knee: Secondary | ICD-10-CM

## 2015-06-19 DIAGNOSIS — I739 Peripheral vascular disease, unspecified: Secondary | ICD-10-CM | POA: Diagnosis not present

## 2015-06-19 DIAGNOSIS — I2583 Coronary atherosclerosis due to lipid rich plaque: Secondary | ICD-10-CM

## 2015-06-19 DIAGNOSIS — I2510 Atherosclerotic heart disease of native coronary artery without angina pectoris: Secondary | ICD-10-CM | POA: Diagnosis not present

## 2015-06-19 NOTE — Progress Notes (Signed)
Patient ID: Stephen Dean, male   DOB: [birth year], 81 y.o.   MRN: [id number]      HPI: Stephen Dean is a 81 y.o. male who presents to the office today for 97-monthcardiology evaluation.  [name] known coronary as well as peripheral vascular disease. In February 1999 he underwent PTCA of a long 70-80% RCA stenosis and had total occlusion of the circumflex and segmental stenoses of his LAD. He is status post abdominal aortic aneurysm repair and right iliac artery aneurysm repair by Dr. HAmedeo Plenty He has a history of right bundle branch block, hypertension, hyperlipidemia. He required right anterior tibial artery thrombectomy in October 2013 and subsequently underwent right above-the-knee amputation by Dr. LKellie Simmering His last nuclear perfusion study in August 2013 was unchanged from previously and showed the old scar in the inferior wall.   Since I last saw him his in April 2016, he has remained fairly stable.  He is on atenolol 50 mg, HCTZ 12.5 mg, and losartan 25 mg for hypertension.  Previously, he developed an ACE-induced cough.  He is on Pravachol 80 mg and Lovaza 1 g twice a day for hyperlipidemia.  He takes Celexa 10 mg for depression.  He denies any myalgias.  His ambulation is limited secondary to his right leg prosthesis and he status post a right AKA.  He denies recurrent anginal symptoms.  He denies palpitations.  He denies presyncope or syncope.  He has chronic right bundle branch block.  He denies edema in the left leg.    Past Medical History  Diagnosis Date  . Peripheral vascular disease ([place]   . Heart disease   . Hyperlipidemia   . Hypertension     Past Surgical History  Procedure Laterality Date  . Hernia repair    . Cardiac surgery      Stent placement x 2  . Embolectomy  06/17/2012    Procedure: EMBOLECTOMY;  Surgeon: BConrad [place] MD;  [place]  Service: Vascular;  Laterality: Right;  Thrombectomy of right tibial arteries,exclusion of right popliteal  aneurysm, femoral -popliteal artery bypass graft,four compartment fasciotomies  . Femoral-popliteal bypass graft  06/17/2012    Procedure: BYPASS GRAFT FEMORAL-POPLITEAL ARTERY;  Surgeon: BConrad [place] MD;  [facility]  Service: Vascular;  Laterality: Right;  Using 6 mm x 80 cm propaten goretex graft  . Intraoperative arteriogram  06/17/2012    Procedure: INTRA OPERATIVE ARTERIOGRAM;  Surgeon: BConrad [place] MD;  [place] [facility]  Service: Vascular;  Laterality: Right;  . Fasciotomy  06/17/2012    Procedure: FASCIOTOMY;  Surgeon: BConrad [place] MD;  [place] [facility]  Service: Vascular;  Laterality: Right;  four compartment  fasciotomy  . Amputation  06/18/2012    Procedure: AMPUTATION ABOVE KNEE;  Surgeon: JMal Misty MD;  [facility]  Service: Vascular;  Laterality: Right;    No Known Allergies  Current Outpatient Prescriptions  Medication Sig Dispense Refill  . acetaminophen (TYLENOL) 325 MG tablet Take 650 mg by mouth every 6 (six) hours as needed.    .Marland Kitchenaspirin 81 MG chewable tablet Chew 81 mg by mouth daily.    .Marland Kitchenatenolol (TENORMIN) 50 MG tablet Take 1 tablet (50 mg total) by mouth daily. 30 tablet 9  . citalopram (CELEXA) 10 MG tablet Take 10 mg by mouth daily.    . hydrochlorothiazide (HYDRODIURIL) 12.5 MG tablet TAKE 1 TABLET (12.5 MG TOTAL) BY MOUTH DAILY. 90 tablet 2  . losartan (  COZAAR) 25 MG tablet Take 25 mg by mouth daily.  3  . Multiple Vitamins-Minerals (CENTURY SENIOR PO) Take 1 tablet by mouth daily.    Marland Kitchen omega-3 acid ethyl esters (LOVAZA) 1 G capsule Take 1 g by mouth 2 (two) times daily.    . pravastatin (PRAVACHOL) 80 MG tablet Take 1 tablet (80 mg total) by mouth daily. 90 tablet 0  . vitamin C (ASCORBIC ACID) 500 MG tablet Take 250 mg by mouth daily.    . vitamin E 400 UNIT capsule Take 400 Units by mouth daily.     No current facility-administered medications for this visit.    Social History   Social History  . Marital Status: Married    Spouse  Name: Stephen Dean  . Number of Children: Stephen Dean  . Years of Education: Stephen Dean   Occupational History  . Not on file.   Social History Main Topics  . Smoking status: Former Smoker -- 1.00 packs/day    Types: Cigarettes  . Smokeless tobacco: Never Used  . Alcohol Use: No  . Drug Use: No  . Sexual Activity: Not on file   Other Topics Concern  . Not on file   Social History Narrative    Socially he is married has 2 children one grandchild. There is a remote tobacco history having quit last year. Marland Kitchen  His wife is also my patient.   ROS General: Negative; No fevers, chills, or night sweats;  HEENT: Positive for difficulty hearing for which he uses hearing aids.  No change in vision., sinus congestion, difficulty swallowing Pulmonary: Negative; No cough, wheezing, shortness of breath, hemoptysis Cardiovascular: Negative; No chest pain, presyncope, syncope, palpatations GI: Negative; No nausea, vomiting, diarrhea, or abdominal pain GU: Negative; No dysuria, hematuria, or difficulty voiding Musculoskeletal: Positive for right above-the-knee amputation with prosthesis; no myalgias, joint pain, or weakness Hematologic/Oncology: Negative; no easy bruising, bleeding Endocrine: Negative; no heat/cold intolerance; no diabetes Neuro: Negative; no changes in balance, headaches Skin: Negative; No rashes or skin lesions Psychiatric: Positive for depression, which has improved with Celexa No behavioral problems,  Sleep: Negative; No snoring, daytime sleepiness, hypersomnolence, bruxism, restless legs, hypnogognic hallucinations, no cataplexy Other comprehensive 14 point system review is negative.  PE BP 104/60 mmHg  Pulse 71  Ht 5' 11"  (1.803 m)  Wt 209 lb (94.802 kg)  BMI 29.16 kg/m2   Repeat blood pressure by me 105/66  Wt Readings from Last 3 Encounters:  06/19/15 209 lb (94.802 kg)  12/14/14 210 lb 8 oz (95.482 kg)  06/21/14 207 lb 4.8 oz (94.031 kg)   General: Alert, oriented, no distress.    Skin: normal turgor, no rashes HEENT: Normocephalic, atraumatic. Pupils round and reactive; sclera anicteric;no lid lag.  Nose without nasal septal hypertrophy Mouth/Parynx benign; Mallinpatti scale 3 Neck: No JVD, no carotid bruits .  Normal carotid upstroke Lungs: Decreased breath sounds; no wheezing or rales Chest: Nontender to palpation Heart: MI not displaced RRR, s1 s2 normal 1/6 systolic murmur.  No S3 or S4 gallop.  No diastolic murmurs, rubs, thrills or heaves. Abdomen: Well-healed abdominal scar from his aneurysm surgery;soft, nontender; no hepatosplenomehaly, BS+; abdominal aorta nontender and not dilated by palpation. Back: No CVA tenderness Pulses 2+ Extremities: Status post right AKA; trace left ankle edema, no clubbing cyanosis , Homan's sign negative  Neurologic: grossly nonfocal Psychological: Normal affect and mood.  ECG (independently read by me): normal sinus rhythm at 71 bpm.  Right bundle branch block with repolarization changes.  April 2016  ECG (independently read by me): Normal sinus rhythm at 67 bpm.  Right bundle branch block with repolarization changes.  October 2015 ECG (independently read by me): Normal sinus rhythm at 76 bpm.   Right bundle branch block with repolarization changes.  No ectopy.  Prior April 2015 ECG : Normal sinus rhythm at 73 beats per minute.  Right bundle branch block with repolarization changes.  Isolated PVC.  Prior 03/24/2013 ECG: Normal sinus rhythm with right bundle branch block and probable left anterior fascicular block  LABS: BMP Latest Ref Rng 12/28/2014 12/27/2013 06/23/2012  Glucose 70 - 99 mg/dL 95 89 107(H)  BUN 6 - 23 mg/dL 11 14 11   Creatinine 0.50 - 1.35 mg/dL 0.92 0.94 0.60  Sodium 135 - 145 mEq/L 139 141 137  Potassium 3.5 - 5.3 mEq/L 3.9 4.0 3.4(L)  Chloride 96 - 112 mEq/L 100 101 99  CO2 19 - 32 mEq/L 28 32 29  Calcium 8.4 - 10.5 mg/dL 9.4 9.5 8.5   Hepatic Function Latest Ref Rng 12/28/2014 12/27/2013 06/23/2012   Total Protein 6.0 - 8.3 g/dL 6.9 6.8 5.7(L)  Albumin 3.5 - 5.2 g/dL 4.0 4.0 2.5(L)  AST 0 - 37 U/L 25 24 53(H)  ALT 0 - 53 U/L 22 17 51  Alk Phosphatase 39 - 117 U/L 56 53 59  Total Bilirubin 0.2 - 1.2 mg/dL 0.8 0.9 0.9   CBC Latest Ref Rng 12/28/2014 12/27/2013 06/23/2012  WBC 4.0 - 10.5 K/uL 6.7 6.4 8.5  Hemoglobin 13.0 - 17.0 g/dL 16.3 15.8 11.9(L)  Hematocrit 39.0 - 52.0 % 46.7 45.5 34.4(L)  Platelets 150 - 400 K/uL 185 187 200   Lab Results  Component Value Date   MCV 93.4 12/28/2014   MCV 91.9 12/27/2013   MCV 93.2 06/23/2012   Lab Results  Component Value Date   TSH 1.378 12/27/2013  No results found for: HGBA1C  Lipid Panel     Component Value Date/Time   CHOL 116 12/28/2014 1104   TRIG 153* 12/28/2014 1104   HDL 38* 12/28/2014 1104   CHOLHDL 3.1 12/28/2014 1104   VLDL 31 12/28/2014 1104   LDLCALC 47 12/28/2014 1104     RADIOLOGY: No results found.    ASSESSMENT AND PLAN: [name] is an 81 year old white male who has known CAD and is status post intervention to his RCA and has known total occlusion to a circumflex with disease in his LAD. His last nuclear perfusion study demonstrated inferior to inferolateral scar concordant with a circumflex occlusion.  He has a history of hypertension.  Blood pressure today continues to be well controlled on his atenolol 50 mg daily, HCTZ 12.5 mg and low-dose losartan at 25 mg..  He no longer is taking Monopril and his blood pressure remained stable.  He developed an ACE induced cough which has resolved with discontinuance of therapy.  I reviewed his recent laboratory from May 2016.  He is on pravastatin and lovaza for hyperlipidemia with his most recent lipid panel showing a total cholesterol 116, and an excellent LDL at 47.  However, he had very minimal triglyceride elevation at 153, and mild low HDL. His depression is controlled with Celexa 10 mg.  He has chronic right bundle branch block.  He is not having recurrent anginal  symptoms or significant shortness of breath, but his exercise is limited due to his prosthesis.  As long as he remains stable, I will see him in 6 months for cardiology reevaluation or sooner if problems arise.  Time spent: 25 minutes  Troy Sine, MD, [facility]  06/19/2015 1:08 PM

## 2015-06-19 NOTE — Patient Instructions (Addendum)
Your physician wants you to follow-up in: 6 months or sooner if needed. You will receive a reminder letter in the mail two months in advance. If you don't receive a letter, please call our office to schedule the follow-up appointment.   If you need a refill on your cardiac medications before your next appointment, please call your pharmacy. 

## 2015-07-19 DIAGNOSIS — R319 Hematuria, unspecified: Secondary | ICD-10-CM | POA: Diagnosis not present

## 2015-07-19 DIAGNOSIS — R300 Dysuria: Secondary | ICD-10-CM | POA: Diagnosis not present

## 2015-07-19 DIAGNOSIS — N419 Inflammatory disease of prostate, unspecified: Secondary | ICD-10-CM | POA: Diagnosis not present

## 2015-08-17 DIAGNOSIS — N390 Urinary tract infection, site not specified: Secondary | ICD-10-CM | POA: Diagnosis not present

## 2015-09-05 ENCOUNTER — Other Ambulatory Visit: Payer: Self-pay | Admitting: Cardiovascular Disease

## 2015-09-05 NOTE — Telephone Encounter (Signed)
Rx request sent to pharmacy.  

## 2015-09-11 DIAGNOSIS — D485 Neoplasm of uncertain behavior of skin: Secondary | ICD-10-CM | POA: Diagnosis not present

## 2015-09-11 DIAGNOSIS — Z23 Encounter for immunization: Secondary | ICD-10-CM | POA: Diagnosis not present

## 2015-09-11 DIAGNOSIS — L821 Other seborrheic keratosis: Secondary | ICD-10-CM | POA: Diagnosis not present

## 2015-09-11 DIAGNOSIS — L814 Other melanin hyperpigmentation: Secondary | ICD-10-CM | POA: Diagnosis not present

## 2015-09-11 DIAGNOSIS — L570 Actinic keratosis: Secondary | ICD-10-CM | POA: Diagnosis not present

## 2015-09-11 DIAGNOSIS — D1800 Hemangioma unspecified site: Secondary | ICD-10-CM | POA: Diagnosis not present

## 2015-09-11 DIAGNOSIS — Z85828 Personal history of other malignant neoplasm of skin: Secondary | ICD-10-CM | POA: Diagnosis not present

## 2015-09-11 DIAGNOSIS — C44511 Basal cell carcinoma of skin of breast: Secondary | ICD-10-CM | POA: Diagnosis not present

## 2015-09-28 DIAGNOSIS — Z0000 Encounter for general adult medical examination without abnormal findings: Secondary | ICD-10-CM | POA: Diagnosis not present

## 2015-09-28 DIAGNOSIS — N3020 Other chronic cystitis without hematuria: Secondary | ICD-10-CM | POA: Diagnosis not present

## 2015-09-28 DIAGNOSIS — N401 Enlarged prostate with lower urinary tract symptoms: Secondary | ICD-10-CM | POA: Diagnosis not present

## 2015-09-28 DIAGNOSIS — R350 Frequency of micturition: Secondary | ICD-10-CM | POA: Diagnosis not present

## 2015-10-11 ENCOUNTER — Ambulatory Visit (INDEPENDENT_AMBULATORY_CARE_PROVIDER_SITE_OTHER): Payer: Medicare HMO | Admitting: Pulmonary Disease

## 2015-10-11 ENCOUNTER — Encounter: Payer: Self-pay | Admitting: Pulmonary Disease

## 2015-10-11 VITALS — BP 132/78 | HR 68 | Ht 72.0 in | Wt 212.0 lb

## 2015-10-11 DIAGNOSIS — J439 Emphysema, unspecified: Secondary | ICD-10-CM | POA: Diagnosis not present

## 2015-10-11 NOTE — Progress Notes (Signed)
Subjective:    Patient ID: Stephen Dean, male    DOB: [birth year], 82 y.o.   MRN: [id number]  HPI New consult for evaluation, management of COPD.  Stephen Dean is an 82 year old with extensive smoking history. He has been referred by Dr. Delfina Redwood for his COPD. His chief complaint today is dyspnea on exertion which has been worsening for several years. He has daily cough with occasional sputum production. No fevers, chills, wheezing, hemoptysis. He was started on Symbicort some time ago but he just used it for 2 months and stopped because he did not like using inhalers. He could not tell if it made any difference with his breathing.  He has limited mobility because of his leg amputation. He is usually confined to the house and uses a wheelchair when he goes outside.  DATA: Chest x-ray 08/09/13 COPD, No acute cardiopulmonary disease. Image reviewed.  Social history: He smoked about 3 packs per day for 6 years. He quit in 2013 when he had the right leg amputation. 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 He is retired now and lives with his wife.  Family history: Emphysema-father Kidney disease-mother.  Patient Active Problem List   Diagnosis Date Noted  . Right bundle branch block 06/23/2014  . H/O abdominal aortic aneurysm repair 12/22/2013  . CAD (coronary artery disease) 04/02/2013  . Hyperlipidemia with target LDL less than 70 04/02/2013  . HTN (hypertension) 04/02/2013  . Cellulitis and abscess of leg 08/10/2012  . Other postoperative infection 08/06/2012  . Popliteal aneurysm ([place]) 07/30/2012  . PVD (peripheral vascular disease) ([place]) 07/13/2012  . S/P AKA (above knee amputation) ([place]) 06/22/2012    Current outpatient prescriptions:  .  acetaminophen (TYLENOL) 325 MG tablet, Take 650 mg by mouth every 6 (six) hours as needed., Disp: , Rfl:  .  aspirin 81 MG chewable tablet, Chew 81 mg by mouth daily., Disp: , Rfl:  .  atenolol (TENORMIN)  50 MG tablet, Take 1 tablet (50 mg total) by mouth daily., Disp: 30 tablet, Rfl: 9 .  cetirizine (ZYRTEC) 10 MG tablet, Take 10 mg by mouth daily., Disp: , Rfl:  .  citalopram (CELEXA) 20 MG tablet, Take 20 mg by mouth daily., Disp: , Rfl: 3 .  hydrochlorothiazide (HYDRODIURIL) 12.5 MG tablet, TAKE 1 TABLET (12.5 MG TOTAL) BY MOUTH DAILY., Disp: 90 tablet, Rfl: 2 .  lansoprazole (PREVACID) 30 MG capsule, Take 30 mg by mouth daily at 12 noon., Disp: , Rfl:  .  losartan (COZAAR) 25 MG tablet, Take 25 mg by mouth daily., Disp: , Rfl: 3 .  Multiple Vitamins-Minerals (CENTURY SENIOR PO), Take 1 tablet by mouth daily., Disp: , Rfl:  .  omega-3 acid ethyl esters (LOVAZA) 1 G capsule, Take 1 g by mouth 2 (two) times daily., Disp: , Rfl:  .  pravastatin (PRAVACHOL) 80 MG tablet, TAKE 1 TABLET (80 MG TOTAL) BY MOUTH DAILY., Disp: 90 tablet, Rfl: 1 .  tamsulosin (FLOMAX) 0.4 MG CAPS capsule, Take 0.4 mg by mouth daily., Disp: , Rfl: 6 .  vitamin C (ASCORBIC ACID) 500 MG tablet, Take 250 mg by mouth daily., Disp: , Rfl:  .  vitamin E 400 UNIT capsule, Take 400 Units by mouth daily., Disp: , Rfl:   Review of Systems Dyspnea on exertion, cough, sputum production. No wheezing, hemoptysis. No fevers, chills, malaise, fatigue. No nausea, vomiting, diarrhea, constipation. No chest pain, palpitations. All other review of systems negative    Objective:  Physical Exam  Blood pressure 132/78, pulse 68, height 6' (1.829 m), weight 212 lb (96.163 kg), SpO2 94 %. Gen: No apparent distress Neuro: No gross focal deficits. Neck: No JVD, lymphadenopathy, thyromegaly. RS: Clear, no wheeze, crackles. CVS: S1-S2 heard, no murmurs rubs gallops. Abdomen: Soft, positive bowel sounds. Extremities: No edema. Rt AKA. Prothesis in place.    Assessment & Plan:  COPD  He likely has severe obstruction based on his heavy exposure to cigarette smoke. I will get a set of lung function tests to evaluate. I discussed with his  therapies for COPD with him but he appears to be reluctant to use inhalers multiple times a day. I discussed use of nebulizers as well but he does not wear a mask. He has agreed to try Spiriva since, it is only one a day. However I'm not entirely sure if he keep up with this therapy. The dyspnea on exertion does not appear to bother him a lot since he is not very mobile.  PLAN: - PFTs - Start Spiriva.  Return to clinic in 3 months.  Marshell Garfinkel MD [facility] Pager [phone] If no answer or after 3pm call: [phone] 10/11/2015, 2:31 PM

## 2015-10-11 NOTE — Patient Instructions (Signed)
We will schedule you for lung function tests. I will prescribed Spiriva inhaler be used once a day in the morning every day.  Return to clinic in 3 months.

## 2015-10-15 ENCOUNTER — Telehealth: Payer: Self-pay | Admitting: Pulmonary Disease

## 2015-10-15 MED ORDER — TIOTROPIUM BROMIDE MONOHYDRATE 18 MCG IN CAPS: 18.0000 ug | ORAL_CAPSULE | Freq: Every day | RESPIRATORY_TRACT | Status: DC

## 2015-10-15 NOTE — Telephone Encounter (Signed)
Last seen on 10/11/15 with PM  Patient Instructions       We will schedule you for lung function tests. I will prescribed Spiriva inhaler be used once a day in the morning every day.  Return to clinic in 3 months.   Called and spoke with the pt's wife. She states that the patient was started on a new inhaler on 10/11/15 by PM but the inhaler was never sent to the pharmacy. I appoligized and informed her that the Spiriva Handihaler. Verified pharmacy as [facility] on [address]. She voiced understanding and had no further questions. Rx sent. Nothing further needed.

## 2015-12-04 DIAGNOSIS — N390 Urinary tract infection, site not specified: Secondary | ICD-10-CM | POA: Diagnosis not present

## 2015-12-04 DIAGNOSIS — R310 Gross hematuria: Secondary | ICD-10-CM | POA: Diagnosis not present

## 2015-12-04 DIAGNOSIS — Z0000 Encounter for general adult medical examination without abnormal findings: Secondary | ICD-10-CM | POA: Diagnosis not present

## 2015-12-08 ENCOUNTER — Other Ambulatory Visit: Payer: Self-pay | Admitting: Cardiovascular Disease

## 2015-12-10 NOTE — Telephone Encounter (Signed)
Rx(s) sent to pharmacy electronically.  

## 2015-12-12 ENCOUNTER — Other Ambulatory Visit: Payer: Self-pay | Admitting: Cardiovascular Disease

## 2015-12-12 DIAGNOSIS — N200 Calculus of kidney: Secondary | ICD-10-CM | POA: Diagnosis not present

## 2015-12-12 DIAGNOSIS — R310 Gross hematuria: Secondary | ICD-10-CM | POA: Diagnosis not present

## 2015-12-12 DIAGNOSIS — K8020 Calculus of gallbladder without cholecystitis without obstruction: Secondary | ICD-10-CM | POA: Diagnosis not present

## 2015-12-12 NOTE — Telephone Encounter (Signed)
Rx request sent to pharmacy.  

## 2016-01-04 DIAGNOSIS — N3020 Other chronic cystitis without hematuria: Secondary | ICD-10-CM | POA: Diagnosis not present

## 2016-01-04 DIAGNOSIS — R310 Gross hematuria: Secondary | ICD-10-CM | POA: Diagnosis not present

## 2016-01-04 DIAGNOSIS — N401 Enlarged prostate with lower urinary tract symptoms: Secondary | ICD-10-CM | POA: Diagnosis not present

## 2016-01-11 ENCOUNTER — Ambulatory Visit (INDEPENDENT_AMBULATORY_CARE_PROVIDER_SITE_OTHER): Payer: Medicare HMO | Admitting: Cardiovascular Disease

## 2016-01-11 ENCOUNTER — Encounter: Payer: Self-pay | Admitting: Cardiovascular Disease

## 2016-01-11 VITALS — BP 115/64 | HR 68 | Ht 72.0 in | Wt 212.0 lb

## 2016-01-11 DIAGNOSIS — E785 Hyperlipidemia, unspecified: Secondary | ICD-10-CM | POA: Diagnosis not present

## 2016-01-11 DIAGNOSIS — Z79899 Other long term (current) drug therapy: Secondary | ICD-10-CM | POA: Diagnosis not present

## 2016-01-11 DIAGNOSIS — I2510 Atherosclerotic heart disease of native coronary artery without angina pectoris: Secondary | ICD-10-CM | POA: Diagnosis not present

## 2016-01-11 DIAGNOSIS — I10 Essential (primary) hypertension: Secondary | ICD-10-CM

## 2016-01-11 DIAGNOSIS — Z89611 Acquired absence of right leg above knee: Secondary | ICD-10-CM

## 2016-01-11 DIAGNOSIS — I739 Peripheral vascular disease, unspecified: Secondary | ICD-10-CM

## 2016-01-11 DIAGNOSIS — I2583 Coronary atherosclerosis due to lipid rich plaque: Secondary | ICD-10-CM

## 2016-01-11 DIAGNOSIS — I4510 Unspecified right bundle-branch block: Secondary | ICD-10-CM

## 2016-01-11 DIAGNOSIS — Z9889 Other specified postprocedural states: Secondary | ICD-10-CM

## 2016-01-11 NOTE — Patient Instructions (Signed)
Your physician recommends that you return for lab work.  Your physician wants you to follow-up in: 6 months or sooner if needed. You will receive a reminder letter in the mail two months in advance. If you don't receive a letter, please call our office to schedule the follow-up appointment.  If you need a refill on your cardiac medications before your next appointment, please call your pharmacy.    

## 2016-01-13 NOTE — Progress Notes (Signed)
Patient ID: Stephen Dean, male   DOB: 1928/09/23, 82 y.o.   MRN: [id number]      HPI: Stephen Dean is a 82 y.o. male who presents to the office today for a 15-monthcardiology evaluation.  [name] known coronary as well as peripheral vascular disease. In February 1999 he underwent PTCA of a long 70-80% RCA stenosis and had total occlusion of the circumflex and segmental stenoses of his LAD. He is status post abdominal aortic aneurysm repair and right iliac artery aneurysm repair by Dr. HAmedeo Plenty He has a history of right bundle branch block, hypertension, hyperlipidemia. He required right anterior tibial artery thrombectomy in October 2013 and subsequently underwent right above-the-knee amputation by Dr. LKellie Simmering His last nuclear perfusion study in August 2013 was unchanged from previously and showed the old scar in the inferior wall.   His ambulation is limited secondary to his right leg prosthesis and he status post a right AKA.  He denies recurrent anginal symptoms.  He denies palpitations.  He denies presyncope or syncope.  He has chronic right bundle branch block.  He denies edema in the left leg.    Since I last saw him, he has remained fairly stable.  He is on atenolol 50 mg, HCTZ 12.5 mg, and losartan 25 mg for hypertension.  Previously, he developed an ACE-induced cough.  He is on Spiriva for COPD.  He admits to intermittent wheezing.  He is on Pravachol 80 mg and Lovaza 1 g twice a day for hyperlipidemia.  He takes Celexa 10 mg for depression.  He denies any myalgias.  He presents for reevaluation.    Past Medical History  Diagnosis Date  . Peripheral vascular disease ([facility]   . Heart disease   . Hyperlipidemia   . Hypertension     Past Surgical History  Procedure Laterality Date  . Hernia repair    . Cardiac surgery      Stent placement x 2  . Embolectomy  06/17/2012    Procedure: EMBOLECTOMY;  Surgeon: BConrad Washburn MD;  [facility]  Service: Vascular;   Laterality: Right;  Thrombectomy of right tibial arteries,exclusion of right popliteal aneurysm, femoral -popliteal artery bypass graft,four compartment fasciotomies  . Femoral-popliteal bypass graft  06/17/2012    Procedure: BYPASS GRAFT FEMORAL-POPLITEAL ARTERY;  Surgeon: BConrad Maverick MD;  [facility]  Service: Vascular;  Laterality: Right;  Using 6 mm x 80 cm propaten goretex graft  . Intraoperative arteriogram  06/17/2012    Procedure: INTRA OPERATIVE ARTERIOGRAM;  Surgeon: BConrad Spanaway MD;  [facility]  Service: Vascular;  Laterality: Right;  . Fasciotomy  06/17/2012    Procedure: FASCIOTOMY;  Surgeon: BConrad Stronghurst MD;  [facility]  Service: Vascular;  Laterality: Right;  four compartment  fasciotomy  . Amputation  06/18/2012    Procedure: AMPUTATION ABOVE KNEE;  Surgeon: JMal Misty MD;  [facility];  Service: Vascular;  Laterality: Right;    No Known Allergies  Current Outpatient Prescriptions  Medication Sig Dispense Refill  . acetaminophen (TYLENOL) 325 MG tablet Take 650 mg by mouth every 6 (six) hours as needed.    .Marland Kitchenaspirin 81 MG chewable tablet Chew 81 mg by mouth daily.    .Marland Kitchenatenolol (TENORMIN) 50 MG tablet TAKE 1 TABLET (50 MG TOTAL) BY MOUTH DAILY. 90 tablet 0  . cetirizine (ZYRTEC) 10 MG tablet Take 10 mg by mouth daily.    . citalopram (CELEXA) 20  MG tablet Take 20 mg by mouth daily.  3  . hydrochlorothiazide (HYDRODIURIL) 12.5 MG tablet TAKE 1 TABLET (12.5 MG TOTAL) BY MOUTH DAILY. 90 tablet 0  . lansoprazole (PREVACID) 30 MG capsule Take 30 mg by mouth daily at 12 noon.    Marland Kitchen losartan (COZAAR) 25 MG tablet Take 25 mg by mouth daily.  3  . Multiple Vitamins-Minerals (CENTURY SENIOR PO) Take 1 tablet by mouth daily.    Marland Kitchen omega-3 acid ethyl esters (LOVAZA) 1 G capsule Take 1 g by mouth 2 (two) times daily.    . pravastatin (PRAVACHOL) 80 MG tablet TAKE 1 TABLET (80 MG TOTAL) BY MOUTH DAILY. 90 tablet 1  . tamsulosin (FLOMAX) 0.4 MG CAPS capsule Take  0.4 mg by mouth daily.  6  . tiotropium (SPIRIVA) 18 MCG inhalation capsule Place 1 capsule (18 mcg total) into inhaler and inhale daily. 30 capsule 6  . vitamin C (ASCORBIC ACID) 500 MG tablet Take 250 mg by mouth daily.    . vitamin E 400 UNIT capsule Take 400 Units by mouth daily.     No current facility-administered medications for this visit.    Social History   Social History  . Marital Status: Married    Spouse Name: N/A  . Number of Children: N/A  . Years of Education: N/A   Occupational History  . Not on file.   Social History Main Topics  . Smoking status: Former Smoker -- 3.00 packs/day for 60 years    Types: Cigarettes    Quit date: 10/10/2012  . Smokeless tobacco: Never Used  . Alcohol Use: No  . Drug Use: No  . Sexual Activity: Not on file   Other Topics Concern  . Not on file   Social History Narrative   Lives with with   Retired - worked with a [employer] company   2 children   Socially he is married has 2 children one grandchild. There is a remote tobacco history having quit last year. Marland Kitchen  His wife is also my patient.  ROS General: Negative; No fevers, chills, or night sweats;  HEENT: Positive for difficulty hearing for which he uses hearing aids.  No change in vision., sinus congestion, difficulty swallowing Pulmonary: Positive for COPD.  Occasional wheezing  Cardiovascular: Negative; No chest pain, presyncope, syncope, palpatations GI: Negative; No nausea, vomiting, diarrhea, or abdominal pain GU: Negative; No dysuria, hematuria, or difficulty voiding Musculoskeletal: Positive for right above-the-knee amputation with prosthesis; no myalgias, joint pain, or weakness Hematologic/Oncology: Negative; no easy bruising, bleeding Endocrine: Negative; no heat/cold intolerance; no diabetes Neuro: Negative; no changes in balance, headaches Skin: Negative; No rashes or skin lesions Psychiatric: Positive for depression, which has improved with Celexa No  behavioral problems,  Sleep: Negative; No snoring, daytime sleepiness, hypersomnolence, bruxism, restless legs, hypnogognic hallucinations, no cataplexy Other comprehensive 14 point system review is negative.  PE BP 115/64 mmHg  Pulse 68  Ht 6' (1.829 m)  Wt 212 lb (96.163 kg)  BMI 28.75 kg/m2   Wt Readings from Last 3 Encounters:  01/11/16 212 lb (96.163 kg)  10/11/15 212 lb (96.163 kg)  06/19/15 209 lb (94.802 kg)   General: Alert, oriented, no distress.  Skin: normal turgor, no rashes HEENT: Normocephalic, atraumatic. Pupils round and reactive; sclera anicteric;no lid lag.  Nose without nasal septal hypertrophy Mouth/Parynx benign; Mallinpatti scale 3 Neck: No JVD, no carotid bruits .  Normal carotid upstroke Lungs: Decreased breath sounds; Very faint intermittent end expiratory wheezing  Chest:  Nontender to palpation Heart: PMI not displaced RRR, s1 s2 normal 1/6 systolic murmur.  No S3 or S4 gallop.  No diastolic murmurs, rubs, thrills or heaves. Abdomen: Well-healed abdominal scar from his aneurysm surgery;soft, nontender; no hepatosplenomehaly, BS+; abdominal aorta nontender and not dilated by palpation. Back: No CVA tenderness Pulses 2+ Extremities: Status post right AKA; trace left ankle edema, no clubbing cyanosis , Homan's sign negative  Neurologic: grossly nonfocal Psychological: Normal affect and mood.  ECG (independently read by me): Normal sinus rhythm at 68 bpm.  Right bundle branch block.  Left anterior hemiblock.  October 2016 ECG (independently read by me): normal sinus rhythm at 71 bpm.  Right bundle branch block with repolarization changes.  April 2016 ECG (independently read by me): Normal sinus rhythm at 67 bpm.  Right bundle branch block with repolarization changes.  October 2015 ECG (independently read by me): Normal sinus rhythm at 76 bpm.   Right bundle branch block with repolarization changes.  No ectopy.  Prior April 2015 ECG : Normal sinus rhythm  at 73 beats per minute.  Right bundle branch block with repolarization changes.  Isolated PVC.  Prior 03/24/2013 ECG: Normal sinus rhythm with right bundle branch block and probable left anterior fascicular block  LABS: BMP Latest Ref Rng 12/28/2014 12/27/2013 06/23/2012  Glucose 70 - 99 mg/dL 95 89 107(H)  BUN 6 - 23 mg/dL 11 14 11   Creatinine 0.50 - 1.35 mg/dL 0.92 0.94 0.60  Sodium 135 - 145 mEq/L 139 141 137  Potassium 3.5 - 5.3 mEq/L 3.9 4.0 3.4(L)  Chloride 96 - 112 mEq/L 100 101 99  CO2 19 - 32 mEq/L 28 32 29  Calcium 8.4 - 10.5 mg/dL 9.4 9.5 8.5   Hepatic Function Latest Ref Rng 12/28/2014 12/27/2013 06/23/2012  Total Protein 6.0 - 8.3 g/dL 6.9 6.8 5.7(L)  Albumin 3.5 - 5.2 g/dL 4.0 4.0 2.5(L)  AST 0 - 37 U/L 25 24 53(H)  ALT 0 - 53 U/L 22 17 51  Alk Phosphatase 39 - 117 U/L 56 53 59  Total Bilirubin 0.2 - 1.2 mg/dL 0.8 0.9 0.9   CBC Latest Ref Rng 12/28/2014 12/27/2013 06/23/2012  WBC 4.0 - 10.5 K/uL 6.7 6.4 8.5  Hemoglobin 13.0 - 17.0 g/dL 16.3 15.8 11.9(L)  Hematocrit 39.0 - 52.0 % 46.7 45.5 34.4(L)  Platelets 150 - 400 K/uL 185 187 200   Lab Results  Component Value Date   MCV 93.4 12/28/2014   MCV 91.9 12/27/2013   MCV 93.2 06/23/2012   Lab Results  Component Value Date   TSH 1.378 12/27/2013  No results found for: HGBA1C  Lipid Panel     Component Value Date/Time   CHOL 116 12/28/2014 1104   TRIG 153* 12/28/2014 1104   HDL 38* 12/28/2014 1104   CHOLHDL 3.1 12/28/2014 1104   VLDL 31 12/28/2014 1104   LDLCALC 47 12/28/2014 1104     RADIOLOGY: No results found.    ASSESSMENT AND PLAN: Mr. Tillman Dean is an 81 year old white male who has known CAD and is status post intervention to his RCA and has known total occlusion to a circumflex with disease in his LAD. His last nuclear perfusion study demonstrated inferior to inferolateral scar concordant with a circumflex occlusion.  He has a history of hypertension and his BP today is well controlled on  atenolol 50 mg  daily, HCTZ 12.5 mg and low-dose losartan at 25 mg..  He no longer is taking Monopril and his blood pressure remained stable.  He developed an ACE induced cough which has resolved with discontinuance of therapy. He has experienced faint intermittent wheezing.  He tells me he will be undergoing pulmonary function studies next month by his pulmonary physician. He is on pravastatin and lovaza for hyperlipidemia with his most recent lipid panel showing a total cholesterol 116, and an excellent LDL at 47.  However, he had very minimal triglyceride elevation at 153, and mild low HDL. His depression is controlled with Celexa 10 mg.  He has bifascicular block with right bundle branch block and left anterior hemiblock.  He is not having recurrent anginal symptoms or significant shortness of breath, but his exercise is limited due to his prosthesis. I have recommended follow-up.  Fasting laboratory.  I will contact him with the results and if adjustments need to be made to his medical regimen.  As long as he remains stable, I will see him in 6 months for follow-up cardiologic evaluation.     Time spent: 25 minutes  Troy Sine, MD, [facility]  01/13/2016 6:24 PM

## 2016-01-31 ENCOUNTER — Ambulatory Visit (INDEPENDENT_AMBULATORY_CARE_PROVIDER_SITE_OTHER): Payer: Medicare HMO | Admitting: Pulmonary Disease

## 2016-01-31 ENCOUNTER — Encounter: Payer: Self-pay | Admitting: Pulmonary Disease

## 2016-01-31 VITALS — BP 112/62 | HR 71 | Ht 71.0 in | Wt 210.0 lb

## 2016-01-31 DIAGNOSIS — J439 Emphysema, unspecified: Secondary | ICD-10-CM | POA: Diagnosis not present

## 2016-01-31 LAB — PULMONARY FUNCTION TEST
DL/VA % PRED: 53 %
DL/VA: 2.48 ml/min/mmHg/L
DLCO UNC % PRED: 39 %
DLCO UNC: 13.25 ml/min/mmHg
DLCO cor % pred: 38 %
DLCO cor: 12.81 ml/min/mmHg
FEF 25-75 Post: 0.72 L/sec
FEF 25-75 Pre: 0.42 L/sec
FEF2575-%CHANGE-POST: 70 %
FEF2575-%PRED-POST: 39 %
FEF2575-%Pred-Pre: 22 %
FEV1-%CHANGE-POST: 12 %
FEV1-%PRED-POST: 47 %
FEV1-%PRED-PRE: 42 %
FEV1-POST: 1.33 L
FEV1-PRE: 1.19 L
FEV1FVC-%CHANGE-POST: 3 %
FEV1FVC-%Pred-Pre: 72 %
FEV6-%Change-Post: 13 %
FEV6-%PRED-PRE: 57 %
FEV6-%Pred-Post: 65 %
FEV6-PRE: 2.14 L
FEV6-Post: 2.43 L
FEV6FVC-%Change-Post: 7 %
FEV6FVC-%PRED-PRE: 100 %
FEV6FVC-%Pred-Post: 107 %
FVC-%Change-Post: 8 %
FVC-%PRED-PRE: 57 %
FVC-%Pred-Post: 62 %
FVC-POST: 2.49 L
FVC-Pre: 2.3 L
POST FEV1/FVC RATIO: 53 %
PRE FEV6/FVC RATIO: 93 %
Post FEV6/FVC ratio: 100 %
Pre FEV1/FVC ratio: 52 %

## 2016-01-31 MED ORDER — TIOTROPIUM BROMIDE-OLODATEROL 2.5-2.5 MCG/ACT IN AERS: 2.0000 | INHALATION_SPRAY | Freq: Every day | RESPIRATORY_TRACT | Status: DC

## 2016-01-31 NOTE — Progress Notes (Signed)
PFT done today. 

## 2016-01-31 NOTE — Patient Instructions (Signed)
We start you on Stiolto Respimat. Stop taking Spiriva Handihaler. Follow up with Dr. Vaughan Browner in 3 months.

## 2016-02-01 NOTE — Progress Notes (Signed)
Subjective:    Patient ID: Stephen Dean, male    DOB: 09/13/1928, 82 y.o.   MRN: [id number]  PROBLEM LIST: Severe COPD, FEV1 42%  HPI Stephen Dean is a 75 year old with extensive smoking history. He has been referred by Dr. Delfina Redwood for his COPD. His chief complaint today is dyspnea on exertion which has been worsening for several years. He has daily cough with occasional sputum production. No fevers, chills, wheezing, hemoptysis. He was started on Symbicort some time ago but he just used it for 2 months and stopped because he did not like using inhalers. He could not tell if it made any difference with his breathing.  He has limited mobility because of his leg amputation. He is usually confined to the house and uses a wheelchair when he goes outside.  DATA: Chest x-ray 08/09/13 COPD, No acute cardiopulmonary disease. Image reviewed.  PFTs 01/31/16 FVC 2.30 (57%) FEV1 1.19 (42%) F/F 52 DLCO 39% Severe obstructive lung disease and reduction in diffusion capacity.  Social history: He smoked about 3 packs per day for 6 years. He quit in 2013 when he had the right leg amputation. 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 He is retired now and lives with his wife.  Family history: Emphysema-father Kidney disease-mother.  Patient Active Problem List   Diagnosis Date Noted  . Right bundle branch block 06/23/2014  . H/O abdominal aortic aneurysm repair 12/22/2013  . CAD (coronary artery disease) 04/02/2013  . Hyperlipidemia with target LDL less than 70 04/02/2013  . HTN (hypertension) 04/02/2013  . Cellulitis and abscess of leg 08/10/2012  . Other postoperative infection 08/06/2012  . Popliteal aneurysm ([place]) 07/30/2012  . PVD (peripheral vascular disease) ([place]) 07/13/2012  . S/P AKA (above knee amputation) ([place]) 06/22/2012    Current outpatient prescriptions:  .  acetaminophen (TYLENOL) 325 MG tablet, Take 650 mg by mouth every 6 (six) hours as  needed., Disp: , Rfl:  .  aspirin 81 MG chewable tablet, Chew 81 mg by mouth daily., Disp: , Rfl:  .  atenolol (TENORMIN) 50 MG tablet, TAKE 1 TABLET (50 MG TOTAL) BY MOUTH DAILY., Disp: 90 tablet, Rfl: 0 .  cetirizine (ZYRTEC) 10 MG tablet, Take 10 mg by mouth daily., Disp: , Rfl:  .  citalopram (CELEXA) 20 MG tablet, Take 20 mg by mouth daily., Disp: , Rfl: 3 .  hydrochlorothiazide (HYDRODIURIL) 12.5 MG tablet, TAKE 1 TABLET (12.5 MG TOTAL) BY MOUTH DAILY., Disp: 90 tablet, Rfl: 0 .  lansoprazole (PREVACID) 30 MG capsule, Take 30 mg by mouth daily at 12 noon., Disp: , Rfl:  .  losartan (COZAAR) 25 MG tablet, Take 25 mg by mouth daily., Disp: , Rfl: 3 .  Multiple Vitamins-Minerals (CENTURY SENIOR PO), Take 1 tablet by mouth daily., Disp: , Rfl:  .  omega-3 acid ethyl esters (LOVAZA) 1 G capsule, Take 1 g by mouth 2 (two) times daily., Disp: , Rfl:  .  pravastatin (PRAVACHOL) 80 MG tablet, TAKE 1 TABLET (80 MG TOTAL) BY MOUTH DAILY., Disp: 90 tablet, Rfl: 1 .  tamsulosin (FLOMAX) 0.4 MG CAPS capsule, Take 0.4 mg by mouth daily., Disp: , Rfl: 6 .  tiotropium (SPIRIVA) 18 MCG inhalation capsule, Place 1 capsule (18 mcg total) into inhaler and inhale daily., Disp: 30 capsule, Rfl: 6 .  vitamin C (ASCORBIC ACID) 500 MG tablet, Take 250 mg by mouth daily., Disp: , Rfl:  .  vitamin E 400 UNIT capsule, Take 400  Units by mouth daily., Disp: , Rfl:  .  Tiotropium Bromide-Olodaterol (STIOLTO RESPIMAT) 2.5-2.5 MCG/ACT AERS, Inhale 2 puffs into the lungs daily., Disp: 1 Inhaler, Rfl: 5  Review of Systems Dyspnea on exertion, cough, sputum production. No wheezing, hemoptysis. No fevers, chills, malaise, fatigue. No nausea, vomiting, diarrhea, constipation. No chest pain, palpitations. All other review of systems negative    Objective:   Physical Exam Blood pressure 112/62, pulse 71, height 5\' 11"  (1.803 m), weight 210 lb (95.255 kg), SpO2 91 %. Gen: No apparent distress Neuro: No gross focal  deficits. Neck: No JVD, lymphadenopathy, thyromegaly. RS: Clear, no wheeze, crackles. CVS: S1-S2 heard, no murmurs rubs gallops. Abdomen: Soft, positive bowel sounds. Extremities: No edema. Rt AKA. Prothesis in place.    Assessment & Plan:  Severe COPD  I discussed with his therapies for COPD with him but he appears to be reluctant to use inhalers multiple times a day. His family is here with him today and insists that he try inhalers. He was given Spiriva but he is using it only intermittently. I'll change this to Stiolto as there is some bronchodilator responsiveness on his PFTs although this does not meet formal criteria. I'm not entirely sure if he keep up with this therapy. The dyspnea on exertion does not appear to bother him a lot since he is not very mobile.  PLAN: - Change spiriva to Stiolto  Return to clinic in 3 months.  Marshell Garfinkel MD [place] Pager [phone] If no answer or after 3pm call: [phone] 02/01/2016, 5:31 PM

## 2016-02-21 DIAGNOSIS — R310 Gross hematuria: Secondary | ICD-10-CM | POA: Diagnosis not present

## 2016-02-22 DIAGNOSIS — R310 Gross hematuria: Secondary | ICD-10-CM | POA: Diagnosis not present

## 2016-02-28 DIAGNOSIS — Z79899 Other long term (current) drug therapy: Secondary | ICD-10-CM | POA: Diagnosis not present

## 2016-02-28 DIAGNOSIS — E7800 Pure hypercholesterolemia, unspecified: Secondary | ICD-10-CM | POA: Diagnosis not present

## 2016-02-28 DIAGNOSIS — I10 Essential (primary) hypertension: Secondary | ICD-10-CM | POA: Diagnosis not present

## 2016-02-28 DIAGNOSIS — J449 Chronic obstructive pulmonary disease, unspecified: Secondary | ICD-10-CM | POA: Diagnosis not present

## 2016-02-28 DIAGNOSIS — R7309 Other abnormal glucose: Secondary | ICD-10-CM | POA: Diagnosis not present

## 2016-02-28 DIAGNOSIS — I739 Peripheral vascular disease, unspecified: Secondary | ICD-10-CM | POA: Diagnosis not present

## 2016-02-28 DIAGNOSIS — R69 Illness, unspecified: Secondary | ICD-10-CM | POA: Diagnosis not present

## 2016-02-28 DIAGNOSIS — N400 Enlarged prostate without lower urinary tract symptoms: Secondary | ICD-10-CM | POA: Diagnosis not present

## 2016-03-07 ENCOUNTER — Other Ambulatory Visit: Payer: Self-pay | Admitting: *Deleted

## 2016-03-07 MED ORDER — PRAVASTATIN SODIUM 80 MG PO TABS: ORAL_TABLET | ORAL | Status: DC

## 2016-03-10 DIAGNOSIS — Z79899 Other long term (current) drug therapy: Secondary | ICD-10-CM | POA: Diagnosis not present

## 2016-03-10 DIAGNOSIS — R7309 Other abnormal glucose: Secondary | ICD-10-CM | POA: Diagnosis not present

## 2016-03-11 ENCOUNTER — Other Ambulatory Visit: Payer: Self-pay

## 2016-03-11 MED ORDER — HYDROCHLOROTHIAZIDE 12.5 MG PO TABS: 12.5000 mg | ORAL_TABLET | Freq: Every day | ORAL | Status: DC

## 2016-03-11 MED ORDER — ATENOLOL 50 MG PO TABS: 50.0000 mg | ORAL_TABLET | Freq: Every day | ORAL | Status: DC

## 2016-03-13 DIAGNOSIS — D1800 Hemangioma unspecified site: Secondary | ICD-10-CM | POA: Diagnosis not present

## 2016-03-13 DIAGNOSIS — L820 Inflamed seborrheic keratosis: Secondary | ICD-10-CM | POA: Diagnosis not present

## 2016-03-13 DIAGNOSIS — L570 Actinic keratosis: Secondary | ICD-10-CM | POA: Diagnosis not present

## 2016-03-13 DIAGNOSIS — L821 Other seborrheic keratosis: Secondary | ICD-10-CM | POA: Diagnosis not present

## 2016-04-29 DIAGNOSIS — J3489 Other specified disorders of nose and nasal sinuses: Secondary | ICD-10-CM | POA: Diagnosis not present

## 2016-05-02 DIAGNOSIS — R220 Localized swelling, mass and lump, head: Secondary | ICD-10-CM | POA: Diagnosis not present

## 2016-05-02 DIAGNOSIS — I10 Essential (primary) hypertension: Secondary | ICD-10-CM | POA: Diagnosis not present

## 2016-05-05 ENCOUNTER — Telehealth: Payer: Self-pay | Admitting: Cardiovascular Disease

## 2016-05-05 NOTE — Telephone Encounter (Signed)
New message       Request for surgical clearance:  1. What type of surgery is being performed? Excision of left nasal cavity mass  2. When is this surgery scheduled? Pending clearance  3. Are there any medications that need to be held prior to surgery and how long? Need cardiac clearance  4. Name of physician performing surgery? Dr Constance Holster  5. What is your office phone and fax number? Fax [phone]

## 2016-05-05 NOTE — Telephone Encounter (Signed)
Surgical Clearance routed to MD Claiborne Billings.

## 2016-05-12 NOTE — Telephone Encounter (Signed)
Attempt to return call-no answer, left message for Traci-made aware that clearance has been sent to MD, awaiting response at this time.

## 2016-05-12 NOTE — Telephone Encounter (Signed)
Tracy calling again,still waiting for clearance, She needs this asap please.

## 2016-05-12 NOTE — Telephone Encounter (Signed)
Clearance faxed via Epic to number provided 

## 2016-05-12 NOTE — Telephone Encounter (Signed)
OK for surgery.

## 2016-05-14 ENCOUNTER — Ambulatory Visit: Payer: Medicare HMO | Admitting: Pulmonary Disease

## 2016-05-19 ENCOUNTER — Ambulatory Visit: Payer: Self-pay | Admitting: Otolaryngology

## 2016-05-19 NOTE — H&P (Signed)
Stephen Dean is a 82 y.o. male who presents as a consult  Patient.   Referring Provider: Burnard Leigh, *   Chief complaint: Nasal mass.  HPI: 6-10 month history of a mass inside the left nasal cavity.  It sometimes bleeds.  He blows his nose frequently.  He has a history of multiple skin cancers over the years.  The mass has been increasing in size.  He has a history of heart disease.  PMH/Meds/All/SocHx/FamHx/ROS:    Past Medical History       Past Medical History:  Diagnosis Date  . Abnormal heart rhythm   . Heart attack ([place])   . High cholesterol   . Hypertension        Past Surgical History        Past Surgical History:  Procedure Laterality Date  . STENT PLACEMENT        No family history of bleeding disorders, wound healing problems or difficulty with anesthesia.    Social History   Social History        Social History  . Marital status: N/A    Spouse name: N/A  . Number of children: N/A  . Years of education: N/A      Occupational History  . Not on file.       Social History Main Topics  . Smoking status: Never Smoker  . Smokeless tobacco: Not on file  . Alcohol use Not on file  . Drug use: Not on file  . Sexual activity: Not on file       Other Topics Concern  . Not on file      Social History Narrative  . No narrative on file       Current Outpatient Prescriptions:  .  atenolol (TENORMIN) 50 MG tablet, TAKE 1 TABLET (50 MG TOTAL) BY MOUTH DAILY., Disp: , Rfl: 3 .  citalopram (CELEXA) 20 MG tablet, TAKE 1 TABLET BY MOUTH ONCE A DAY FOR 90 DAYS, Disp: , Rfl: 1 .  hydroCHLOROthiazide (HYDRODIURIL) 12.5 MG tablet, TAKE 1 TABLET (12.5 MG TOTAL) BY MOUTH DAILY., Disp: , Rfl: 3 .  losartan (COZAAR) 25 MG tablet, TAKE 1 TABLET ONCE A DAY FOR 90 DAY(S), Disp: , Rfl: 3 .  pravastatin (PRAVACHOL) 80 MG tablet, TAKE 1 TABLET BY MOUTH DAILY., Disp: , Rfl: 3 .  STIOLTO RESPIMAT 2.5-2.5 mcg/actuation Mist,  INHALE 2 PUFFS INTO THE LUNGS DAILY., Disp: , Rfl: 5 .  tamsulosin (FLOMAX) 0.4 mg Cp24 capsule, TAKE 1 CAPSULE BY MOUTH DAILY, Disp: , Rfl: 3  A complete ROS was performed with pertinent positives/negatives noted in the HPI. The remainder of the ROS are negative.   Physical Exam:    BP 114/60 (Site: Left arm, Position: Sitting)   General:  Healthy and alert, in no distress, breathing easily. Normal affect. In a pleasant mood. Head: Normocephalic, atraumatic. No masses, or scars. Eyes: Pupils are equal, and reactive to light. Vision is grossly intact. No spontaneous or gaze nystagmus. Ears: Ear canals are clear. Tympanic membranes are intact, with normal landmarks and the middle ears are clear and healthy. Hearing: Grossly normal. Nose: Nasal cavities are clear with healthy mucosa, no polyps or exudate.Airways are patent. Face: No masses or scars, facial nerve function is symmetric. Oral Cavity: No mucosal abnormalities are noted. Tongue with normal mobility. Dentition appears healthy. Oropharynx: Tonsils are symmetric. There are no mucosal masses identified. Tongue base appears normal and healthy. Larynx/Hypopharynx: deferred Chest: Deferred Neck: No palpable masses, no  cervical adenopathy, no thyroid nodules or enlargement. Neuro: Cranial nerves II-XII will normal function. Balance: Normal gate. Other findings: none.   Independent Review of Additional Tests or Records:  none  Procedures:  none   Impression & Plans:  Nasal lesion, most likely skin cancer.  Recommend excisional biopsy under anesthesia in the operating room.  We can check margins.  May require skin graft.  Possibly even a nasolabial flap.  Will need clearance with his cardiologist first.

## 2016-05-28 DIAGNOSIS — D485 Neoplasm of uncertain behavior of skin: Secondary | ICD-10-CM | POA: Diagnosis not present

## 2016-05-28 DIAGNOSIS — Z23 Encounter for immunization: Secondary | ICD-10-CM | POA: Diagnosis not present

## 2016-06-11 ENCOUNTER — Encounter (HOSPITAL_COMMUNITY): Payer: Self-pay

## 2016-06-11 NOTE — Pre-Procedure Instructions (Signed)
Stephen Dean  06/11/2016      [id number] [name], [place] - [address] [place] [address] Phone: [phone] Fax: [phone]    Your procedure is scheduled on October 25  Report to [facility] Admitting at 0900 A.M.  Call this number if you have problems the morning of surgery:  [phone]   Remember:  Do not eat food or drink liquids after midnight.   Take these medicines the morning of surgery with A SIP OF WATER acetaminophen (TYLENOL), atenolol (TENORMIN), cetirizine (ZYRTEC) ,   citalopram (CELEXA,tamsulosin (FLOMAX) ,  tiotropium (SPIRIVA, Tiotropium Bromide-Olodaterol (STIOLTO RESPIMAT)   7 days prior to surgery STOP taking any Aspirin, Aleve, Naproxen, Ibuprofen, Motrin, Advil, Goody's, BC's, all herbal medications, fish oil, and all vitamins    Do not wear jewelry.  Do not wear lotions, powders, or cologne, or deoderant.  Men may shave face and neck.  Do not bring valuables to the hospital.  [facility] is not responsible for any belongings or valuables.  Contacts, dentures or bridgework may not be worn into surgery.  Leave your suitcase in the car.  After surgery it may be brought to your room.  For patients admitted to the hospital, discharge time will be determined by your treatment team.  Patients discharged the day of surgery will not be allowed to drive home.    Special instructions:   [facility]- Preparing For Surgery  Before surgery, you can play an important role. Because skin is not sterile, your skin needs to be as free of germs as possible. You can reduce the number of germs on your skin by washing with CHG (chlorahexidine gluconate) Soap before surgery.  CHG is an antiseptic cleaner which kills germs and bonds with the skin to continue killing germs even after washing.  Please do not use if you have an allergy to CHG or antibacterial soaps. If your skin becomes reddened/irritated stop  using the CHG.  Do not shave (including legs and underarms) for at least 48 hours prior to first CHG shower. It is OK to shave your face.  Please follow these instructions carefully.   1. Shower the NIGHT BEFORE SURGERY and the MORNING OF SURGERY with CHG.   2. If you chose to wash your hair, wash your hair first as usual with your normal shampoo.  3. After you shampoo, rinse your hair and body thoroughly to remove the shampoo.  4. Use CHG as you would any other liquid soap. You can apply CHG directly to the skin and wash gently with a scrungie or a clean washcloth.   5. Apply the CHG Soap to your body ONLY FROM THE NECK DOWN.  Do not use on open wounds or open sores. Avoid contact with your eyes, ears, mouth and genitals (private parts). Wash genitals (private parts) with your normal soap.  6. Wash thoroughly, paying special attention to the area where your surgery will be performed.  7. Thoroughly rinse your body with warm water from the neck down.  8. DO NOT shower/wash with your normal soap after using and rinsing off the CHG Soap.  9. Pat yourself dry with a CLEAN TOWEL.   10. Wear CLEAN PAJAMAS   11. Place CLEAN SHEETS on your bed the night of your first shower and DO NOT SLEEP WITH PETS.    Day of Surgery: Do not apply any deodorants/lotions. Please wear clean clothes to the hospital/surgery center.  Please read over the following fact sheets that you were given.

## 2016-06-12 ENCOUNTER — Encounter (HOSPITAL_COMMUNITY): Payer: Self-pay

## 2016-06-12 ENCOUNTER — Encounter (HOSPITAL_COMMUNITY)
Admission: RE | Admit: 2016-06-12 | Discharge: 2016-06-12 | Disposition: A | Discharge status: Home / Self Care | Payer: Medicare HMO | Source: Ambulatory Visit | Attending: Otolaryngology | Admitting: Otolaryngology

## 2016-06-12 DIAGNOSIS — I10 Essential (primary) hypertension: Secondary | ICD-10-CM | POA: Diagnosis not present

## 2016-06-12 DIAGNOSIS — J449 Chronic obstructive pulmonary disease, unspecified: Secondary | ICD-10-CM | POA: Diagnosis not present

## 2016-06-12 DIAGNOSIS — Z01812 Encounter for preprocedural laboratory examination: Secondary | ICD-10-CM | POA: Insufficient documentation

## 2016-06-12 DIAGNOSIS — I739 Peripheral vascular disease, unspecified: Secondary | ICD-10-CM | POA: Diagnosis not present

## 2016-06-12 DIAGNOSIS — I2510 Atherosclerotic heart disease of native coronary artery without angina pectoris: Secondary | ICD-10-CM | POA: Diagnosis not present

## 2016-06-12 DIAGNOSIS — I714 Abdominal aortic aneurysm, without rupture: Secondary | ICD-10-CM | POA: Diagnosis not present

## 2016-06-12 DIAGNOSIS — E785 Hyperlipidemia, unspecified: Secondary | ICD-10-CM | POA: Insufficient documentation

## 2016-06-12 HISTORY — DX: Gastro-esophageal reflux disease without esophagitis: K21.9

## 2016-06-12 HISTORY — DX: Major depressive disorder, single episode, unspecified: F32.9

## 2016-06-12 HISTORY — DX: Chronic obstructive pulmonary disease, unspecified: J44.9

## 2016-06-12 HISTORY — DX: Unspecified malignant neoplasm of skin, unspecified: C44.90

## 2016-06-12 HISTORY — DX: Atherosclerotic heart disease of native coronary artery without angina pectoris: I25.10

## 2016-06-12 HISTORY — DX: Renal tubulo-interstitial disease, unspecified: N15.9

## 2016-06-12 HISTORY — DX: Unspecified osteoarthritis, unspecified site: M19.90

## 2016-06-12 HISTORY — DX: Acute myocardial infarction, unspecified: I21.9

## 2016-06-12 HISTORY — DX: Dyspnea, unspecified: R06.00

## 2016-06-12 HISTORY — DX: Depression, unspecified: F32.A

## 2016-06-12 LAB — CBC
HCT: 42.8 % (ref 39.0–52.0)
HEMOGLOBIN: 15.2 g/dL (ref 13.0–17.0)
MCH: 32.6 pg (ref 26.0–34.0)
MCHC: 35.5 g/dL (ref 30.0–36.0)
MCV: 91.8 fL (ref 78.0–100.0)
PLATELETS: 171 10*3/uL (ref 150–400)
RBC: 4.66 MIL/uL (ref 4.22–5.81)
RDW: 13.8 % (ref 11.5–15.5)
WBC: 8.1 10*3/uL (ref 4.0–10.5)

## 2016-06-12 LAB — BASIC METABOLIC PANEL
ANION GAP: 7 (ref 5–15)
BUN: 12 mg/dL (ref 6–20)
CALCIUM: 9.5 mg/dL (ref 8.9–10.3)
CO2: 26 mmol/L (ref 22–32)
CREATININE: 0.92 mg/dL (ref 0.61–1.24)
Chloride: 104 mmol/L (ref 101–111)
Glucose, Bld: 138 mg/dL — ABNORMAL HIGH (ref 65–99)
Potassium: 3.8 mmol/L (ref 3.5–5.1)
SODIUM: 137 mmol/L (ref 135–145)

## 2016-06-12 NOTE — Progress Notes (Signed)
   06/12/16 1116  OBSTRUCTIVE SLEEP APNEA  Have you ever been diagnosed with sleep apnea through a sleep study? No  Do you snore loudly (loud enough to be heard through closed doors)?  1  Do you often feel tired, fatigued, or sleepy during the daytime (such as falling asleep during driving or talking to someone)? 0  Has anyone observed you stop breathing during your sleep? 0  Do you have, or are you being treated for high blood pressure? 1  BMI more than 35 kg/m2? 0  Age > 50 (1-yes) 1  Neck circumference greater than:Male 16 inches or larger, Male 17inches or larger? 1  Male Gender (Yes=1) 1  Obstructive Sleep Apnea Score 5  Score 5 or greater  Results sent to PCP

## 2016-06-12 NOTE — Progress Notes (Signed)
PCP - ronald Polite Cardiologist - Kelley  Chest x-ray - not needed EKG - 01-11-16 Stress Test - 5-10 years Dr Georgina Peer ECHO - 06/18/11 Cardiac Cath - 99  Patient has two cardiac stents placed.  Will send to anesthesia for ekg review relating to R BBB    Patient denies shortness of breath, fever, cough and chest pain at PAT appointment

## 2016-06-12 NOTE — Progress Notes (Addendum)
Anesthesia Chart Review:  Pt is an 82 year old male scheduled for excision L nasal mass, frozen section evaluation, possible skin graft possible nasal labial flap on 06/18/2016 with Izora Gala, MD.   - Cardiologist is Shelva Majestic, MD who has cleared pt for surgery. Last office visit 01/13/16.   PMH includes:  CAD ( total occlusion CX, segmental stenoses LAD, s/p PTCA RCA in 1999), HTN, hyperlipidemia, PAD (s/p R FPBG 06/17/12), AAA (s/p repair), COPD. Former smoker. BMI 29.5. 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   Medications include: ASA 81 mg, atenolol, HCTZ, Prevacid, losartan, pravastatin, spiriva, stiolto respimat  Preoperative labs reviewed.   EKG 01/15/16: NSR. RBBB. LAFB  Nuclear stress test 03/30/12:  1. There is a moderate to severe perfusion defect due to infarct/scar with mild peri-infarct ischemia seen in the basal inferior, mid inferior, basal inferior lateral and mid inferior lateral regions. The post-rest LV is normal in size. EF is 63%. 2. Compared to the previous study, there is no significant change. This is a low risk scan.  Echo 06/18/11:  1. LVEF 50-55%, mild inferior basal, posterior and basal lateral hypokinesis. Incoordinate septal motion. 2. Stage I diastolic dysfunction 3. Elevated LV filling pressure. 4. Normal LV wall thickness. 5. Normal LA size. 6. Mild MR, TR 7. RVSP 30 mmHg  If no changes, I anticipate pt can proceed with surgery as scheduled.   Willeen Cass, FNP-BC [facility]/Anesthesiology Phone: [phone] 06/12/2016 3:24 PM

## 2016-06-18 ENCOUNTER — Ambulatory Visit (HOSPITAL_COMMUNITY): Payer: Medicare HMO | Admitting: Emergency Medicine

## 2016-06-18 ENCOUNTER — Ambulatory Visit (HOSPITAL_COMMUNITY): Payer: Medicare HMO | Admitting: Anesthesiology

## 2016-06-18 ENCOUNTER — Encounter (HOSPITAL_COMMUNITY): Payer: Self-pay | Admitting: Surgery

## 2016-06-18 ENCOUNTER — Ambulatory Visit (HOSPITAL_COMMUNITY)
Admission: RE | Admit: 2016-06-18 | Discharge: 2016-06-18 | Disposition: A | Discharge status: Home / Self Care | Payer: Medicare HMO | Source: Ambulatory Visit | Attending: Otolaryngology | Admitting: Otolaryngology

## 2016-06-18 ENCOUNTER — Encounter (HOSPITAL_COMMUNITY)
Admission: RE | Disposition: A | Discharge status: Home / Self Care | Payer: Self-pay | Source: Ambulatory Visit | Attending: Otolaryngology

## 2016-06-18 DIAGNOSIS — I2510 Atherosclerotic heart disease of native coronary artery without angina pectoris: Secondary | ICD-10-CM | POA: Diagnosis not present

## 2016-06-18 DIAGNOSIS — Z87891 Personal history of nicotine dependence: Secondary | ICD-10-CM | POA: Diagnosis not present

## 2016-06-18 DIAGNOSIS — N289 Disorder of kidney and ureter, unspecified: Secondary | ICD-10-CM | POA: Insufficient documentation

## 2016-06-18 DIAGNOSIS — I129 Hypertensive chronic kidney disease with stage 1 through stage 4 chronic kidney disease, or unspecified chronic kidney disease: Secondary | ICD-10-CM | POA: Diagnosis not present

## 2016-06-18 DIAGNOSIS — C300 Malignant neoplasm of nasal cavity: Secondary | ICD-10-CM | POA: Diagnosis not present

## 2016-06-18 DIAGNOSIS — J3489 Other specified disorders of nose and nasal sinuses: Secondary | ICD-10-CM | POA: Diagnosis not present

## 2016-06-18 DIAGNOSIS — S0120XA Unspecified open wound of nose, initial encounter: Secondary | ICD-10-CM | POA: Diagnosis not present

## 2016-06-18 DIAGNOSIS — J449 Chronic obstructive pulmonary disease, unspecified: Secondary | ICD-10-CM | POA: Diagnosis not present

## 2016-06-18 DIAGNOSIS — I252 Old myocardial infarction: Secondary | ICD-10-CM | POA: Insufficient documentation

## 2016-06-18 DIAGNOSIS — I739 Peripheral vascular disease, unspecified: Secondary | ICD-10-CM | POA: Diagnosis not present

## 2016-06-18 DIAGNOSIS — K219 Gastro-esophageal reflux disease without esophagitis: Secondary | ICD-10-CM | POA: Diagnosis not present

## 2016-06-18 DIAGNOSIS — N189 Chronic kidney disease, unspecified: Secondary | ICD-10-CM | POA: Diagnosis not present

## 2016-06-18 HISTORY — PX: EXCISION NASAL MASS: SHX6271

## 2016-06-18 SURGERY — EXCISION, MASS, NOSE
Anesthesia: General | Laterality: Left

## 2016-06-18 MED ORDER — PHENYLEPHRINE HCL 10 MG/ML IJ SOLN
INTRAVENOUS | Status: DC | PRN
  Administered 2016-06-18: 50 ug/min via INTRAVENOUS

## 2016-06-18 MED ORDER — PROPOFOL 10 MG/ML IV BOLUS
INTRAVENOUS | Status: DC | PRN
  Administered 2016-06-18: 150 mg via INTRAVENOUS

## 2016-06-18 MED ORDER — EPHEDRINE 5 MG/ML INJ
INTRAVENOUS | Status: AC
  Filled 2016-06-18: qty 10

## 2016-06-18 MED ORDER — LIDOCAINE-EPINEPHRINE 1 %-1:100000 IJ SOLN
INTRAMUSCULAR | Status: DC | PRN
  Administered 2016-06-18: 20 mL

## 2016-06-18 MED ORDER — ONDANSETRON HCL 4 MG/2ML IJ SOLN
INTRAMUSCULAR | Status: DC | PRN
  Administered 2016-06-18: 4 mg via INTRAVENOUS

## 2016-06-18 MED ORDER — LIDOCAINE HCL (CARDIAC) 20 MG/ML IV SOLN
INTRAVENOUS | Status: DC | PRN
Start: 2016-06-18 — End: 2016-06-18
  Administered 2016-06-18: 80 mg via INTRAVENOUS

## 2016-06-18 MED ORDER — EPHEDRINE SULFATE 50 MG/ML IJ SOLN
INTRAMUSCULAR | Status: DC | PRN
  Administered 2016-06-18 (×2): 10 mg via INTRAVENOUS
  Administered 2016-06-18: 15 mg via INTRAVENOUS
  Administered 2016-06-18: 5 mg via INTRAVENOUS
  Administered 2016-06-18: 10 mg via INTRAVENOUS

## 2016-06-18 MED ORDER — CEPHALEXIN 500 MG PO CAPS
500.0000 mg | ORAL_CAPSULE | Freq: Three times a day (TID) | ORAL | 0 refills | Status: DC
Start: 2016-06-18 — End: 2016-11-18

## 2016-06-18 MED ORDER — LIDOCAINE-EPINEPHRINE 1 %-1:100000 IJ SOLN
INTRAMUSCULAR | Status: AC
  Filled 2016-06-18: qty 1

## 2016-06-18 MED ORDER — ARTIFICIAL TEARS OP OINT
TOPICAL_OINTMENT | OPHTHALMIC | Status: DC | PRN
  Administered 2016-06-18: 1 via OPHTHALMIC

## 2016-06-18 MED ORDER — ROCURONIUM BROMIDE 100 MG/10ML IV SOLN
INTRAVENOUS | Status: DC | PRN
  Administered 2016-06-18: 40 mg via INTRAVENOUS

## 2016-06-18 MED ORDER — SUGAMMADEX SODIUM 200 MG/2ML IV SOLN
INTRAVENOUS | Status: AC
  Filled 2016-06-18: qty 2

## 2016-06-18 MED ORDER — OXYMETAZOLINE HCL 0.05 % NA SOLN
NASAL | Status: DC | PRN
  Administered 2016-06-18: 1 via TOPICAL

## 2016-06-18 MED ORDER — BACITRACIN ZINC 500 UNIT/GM EX OINT
TOPICAL_OINTMENT | CUTANEOUS | Status: AC
  Filled 2016-06-18: qty 28.35

## 2016-06-18 MED ORDER — LIDOCAINE 2% (20 MG/ML) 5 ML SYRINGE
INTRAMUSCULAR | Status: AC
  Filled 2016-06-18: qty 5

## 2016-06-18 MED ORDER — ROCURONIUM BROMIDE 10 MG/ML (PF) SYRINGE
PREFILLED_SYRINGE | INTRAVENOUS | Status: AC
  Filled 2016-06-18: qty 10

## 2016-06-18 MED ORDER — OXYMETAZOLINE HCL 0.05 % NA SOLN
NASAL | Status: AC
  Filled 2016-06-18: qty 15

## 2016-06-18 MED ORDER — SILVER NITRATE-POT NITRATE 75-25 % EX MISC
CUTANEOUS | Status: DC | PRN
  Administered 2016-06-18: 1

## 2016-06-18 MED ORDER — BACITRACIN ZINC 500 UNIT/GM EX OINT
TOPICAL_OINTMENT | CUTANEOUS | Status: DC | PRN
  Administered 2016-06-18: 1 via TOPICAL

## 2016-06-18 MED ORDER — PHENYLEPHRINE HCL 10 MG/ML IJ SOLN
INTRAMUSCULAR | Status: DC | PRN
  Administered 2016-06-18: 80 ug via INTRAVENOUS

## 2016-06-18 MED ORDER — FENTANYL CITRATE (PF) 100 MCG/2ML IJ SOLN: 25.0000 ug | INTRAMUSCULAR | Status: DC | PRN

## 2016-06-18 MED ORDER — PHENYLEPHRINE 40 MCG/ML (10ML) SYRINGE FOR IV PUSH (FOR BLOOD PRESSURE SUPPORT)
PREFILLED_SYRINGE | INTRAVENOUS | Status: AC
  Filled 2016-06-18: qty 10

## 2016-06-18 MED ORDER — FENTANYL CITRATE (PF) 100 MCG/2ML IJ SOLN
INTRAMUSCULAR | Status: AC
  Filled 2016-06-18: qty 2

## 2016-06-18 MED ORDER — ONDANSETRON HCL 4 MG/2ML IJ SOLN
INTRAMUSCULAR | Status: AC
  Filled 2016-06-18: qty 2

## 2016-06-18 MED ORDER — PROPOFOL 10 MG/ML IV BOLUS
INTRAVENOUS | Status: AC
  Filled 2016-06-18: qty 20

## 2016-06-18 MED ORDER — LACTATED RINGERS IV SOLN
INTRAVENOUS | Status: DC
  Administered 2016-06-18 (×2): via INTRAVENOUS

## 2016-06-18 MED ORDER — PROMETHAZINE HCL 25 MG RE SUPP: 25.0000 mg | Freq: Four times a day (QID) | RECTAL | 1 refills | Status: DC | PRN

## 2016-06-18 MED ORDER — SUGAMMADEX SODIUM 200 MG/2ML IV SOLN
INTRAVENOUS | Status: DC | PRN
  Administered 2016-06-18: 191 mg via INTRAVENOUS

## 2016-06-18 MED ORDER — ARTIFICIAL TEARS OP OINT
TOPICAL_OINTMENT | OPHTHALMIC | Status: AC
  Filled 2016-06-18: qty 3.5

## 2016-06-18 MED ORDER — FENTANYL CITRATE (PF) 100 MCG/2ML IJ SOLN
INTRAMUSCULAR | Status: DC | PRN
  Administered 2016-06-18: 50 ug via INTRAVENOUS

## 2016-06-18 MED ORDER — CEFAZOLIN SODIUM-DEXTROSE 2-4 GM/100ML-% IV SOLN
2.0000 g | INTRAVENOUS | Status: AC
  Administered 2016-06-18: 2 g via INTRAVENOUS
  Filled 2016-06-18: qty 100

## 2016-06-18 MED ORDER — HYDROCODONE-ACETAMINOPHEN 7.5-325 MG PO TABS
1.0000 | ORAL_TABLET | Freq: Four times a day (QID) | ORAL | 0 refills | Status: AC | PRN
Start: 2016-06-18 — End: ?

## 2016-06-18 SURGICAL SUPPLY — 39 items
ATTRACTOMAT 16X20 MAGNETIC DRP (DRAPES) IMPLANT
BLADE SURG 15 STRL LF DISP TIS (BLADE) ×1 IMPLANT
BLADE SURG 15 STRL SS (BLADE) ×1
CANISTER SUCTION 2500CC (MISCELLANEOUS) ×2 IMPLANT
CLEANER TIP ELECTROSURG 2X2 (MISCELLANEOUS) ×2 IMPLANT
COAGULATOR SUCT SWTCH 10FR 6 (ELECTROSURGICAL) IMPLANT
CONT SPEC 4OZ CLIKSEAL STRL BL (MISCELLANEOUS) ×8 IMPLANT
CORDS BIPOLAR (ELECTRODE) ×2 IMPLANT
COVER SURGICAL LIGHT HANDLE (MISCELLANEOUS) ×2 IMPLANT
CRADLE DONUT ADULT HEAD (MISCELLANEOUS) ×2 IMPLANT
DRAPE PROXIMA HALF (DRAPES) IMPLANT
DRESSING NASAL POPE 10X1.5X2.5 (GAUZE/BANDAGES/DRESSINGS) ×1 IMPLANT
DRESSING TELFA 8X3 (GAUZE/BANDAGES/DRESSINGS) ×2 IMPLANT
DRSG NASAL POPE 10X1.5X2.5 (GAUZE/BANDAGES/DRESSINGS) ×2
ELECT COATED BLADE 2.86 ST (ELECTRODE) ×2 IMPLANT
FORCEPS BIPOLAR SPETZLER 8 1.0 (NEUROSURGERY SUPPLIES) ×2 IMPLANT
GAUZE SPONGE 2X2 8PLY STRL LF (GAUZE/BANDAGES/DRESSINGS) ×1 IMPLANT
GAUZE SPONGE 4X4 16PLY XRAY LF (GAUZE/BANDAGES/DRESSINGS) ×4 IMPLANT
GLOVE ECLIPSE 7.5 STRL STRAW (GLOVE) ×2 IMPLANT
GOWN STRL REUS W/ TWL LRG LVL3 (GOWN DISPOSABLE) ×2 IMPLANT
GOWN STRL REUS W/TWL LRG LVL3 (GOWN DISPOSABLE) ×2
KIT BASIN OR (CUSTOM PROCEDURE TRAY) ×2 IMPLANT
KIT ROOM TURNOVER OR (KITS) ×2 IMPLANT
NEEDLE 27GAX1X1/2 (NEEDLE) ×2 IMPLANT
NS IRRIG 1000ML POUR BTL (IV SOLUTION) ×2 IMPLANT
PAD ARMBOARD 7.5X6 YLW CONV (MISCELLANEOUS) ×4 IMPLANT
PATTIES SURGICAL .5 X3 (DISPOSABLE) ×2 IMPLANT
PENCIL FOOT CONTROL (ELECTRODE) ×2 IMPLANT
SPONGE GAUZE 2X2 STER 10/PKG (GAUZE/BANDAGES/DRESSINGS) ×1
SUT CHROMIC 3 0 SH 27 (SUTURE) ×4 IMPLANT
SUT CHROMIC 4 0 P 3 18 (SUTURE) ×2 IMPLANT
SUT ETHILON 3 0 PS 1 (SUTURE) IMPLANT
SUT PLAIN 4 0 ~~LOC~~ 1 (SUTURE) ×2 IMPLANT
SUT PLAIN 5 0 P 3 18 (SUTURE) ×4 IMPLANT
SUT SILK 3 0 SH 30 (SUTURE) ×2 IMPLANT
SUT VICRYL 4-0 PS2 18IN ABS (SUTURE) ×4 IMPLANT
TOWEL OR 17X24 6PK STRL BLUE (TOWEL DISPOSABLE) ×2 IMPLANT
TRAY ENT MC OR (CUSTOM PROCEDURE TRAY) ×2 IMPLANT
WATER STERILE IRR 1000ML POUR (IV SOLUTION) IMPLANT

## 2016-06-18 NOTE — Op Note (Addendum)
OPERATIVE REPORT  DATE OF SURGERY: 06/18/2016  PATIENT:  Stephen Dean,  82 y.o. male  PRE-OPERATIVE DIAGNOSIS:  Left Nasal Cavity Mass  POST-OPERATIVE DIAGNOSIS:  Left Nasal Cavity Mass  PROCEDURE:  Procedure(s): EXCISION NASAL MASS, Nasolabial flap reconstruction  SURGEON:  Beckie Salts, MD  ASSISTANTS: none  ANESTHESIA:   General   EBL:  100 ml  DRAINS: none  LOCAL MEDICATIONS USED:  1% Xylocaine with epinephrine  SPECIMEN:  Nasal mass, 1. Biopsy for frozen section, consistent with invasive squamous cell carcinoma. 2. Nasal cavity mass, sutures marked for orientation. Margins checked, all negative. 3. Additional margin in the upper dome area also negative for carcinoma.  COUNTS:  Correct  PROCEDURE DETAILS: The patient was taken to the operating room and placed on the operating table in the supine position. Following induction of general endotracheal anesthesia, the nose and face were prepped and draped in a standard fashion. 1. Biopsy and frozen section of nasal mass. A 15 scalpel was used to remove a portion of the lesion which was sent for pathologic evaluation using frozen section. This was consistent with invasive squamous cell carcinoma.  2. Excision of left nasal cavity mass. A marking pen was used to outline the proposed incisions along the upper lip and the lateral along the nasal alar area as well as the columella. The midportion of the columella was the medial margin, the upper aspect of the upper lip was the inferior margin, and the attachment of the lateral alar was the lateral margin. The septal mucosa was the posterior margin. Electrocautery was used to incise the skin in all these areas. The lesion was second off of the septal cartilage using a Surveyor, quantity. The cartilage itself was kept intact but the perichondrium was taken as the deep margin. Sutures were used to orient the specimen and was sent for frozen section analysis of margins which were all  negative.  3. Given the extent of the defect and the complexity of it, a nasolabial flap was used for reconstruction. This was designed with a superior pedicle. The length and width were outlined with a marking pen and incised with a 15 scalpel after injecting local anesthetic solution. The flap was undermined and mobilized completely. Bipolar cautery was used for hemostasis. The alar was detached from the adjacent skin to allow for the pedicle to fit within that defect to prevent a dogear deformity. The donor site was reapproximated with with 4-0 Vicryl in the deep layer and running 5-0 plain gut on the skin. There was a nice tension-free closure. The flap was then placed into the defect and the distal and was reapproximated up to the upper columellar area. Multiple interrupted Vicryl sutures were used to position the flap appropriately. In between, 5-0 plain gut was used and 3-0 chromic was used to reapproximate along the mucosal edge along the septum posteriorly. The left nasal cavity was packed with a Merocel nasal packing which was then inflated with the local anesthetic solution. Bacitracin was applied to the external suture lines. The cosmetic result looked excellent. The flap appeared to be viable.  The patient was awakened extubated and transferred to recovery in stable condition.  PATIENT DISPOSITION:  To PACU, stable  *Note: The entire lesion was approximate 3 cm in greatest dimension. The entire defect was approximately 4-1/2 cm in greatest dimension.

## 2016-06-18 NOTE — OR Nursing (Signed)
DR. Lyndon Code from pathology called at 65 to inform Dr. Constance Holster that the margins of all frozen specimens were negative carcinoma.

## 2016-06-18 NOTE — Transfer of Care (Signed)
Immediate Anesthesia Transfer of Care Note  Patient: Stephen Dean  Procedure(s) Performed: Procedure(s) with comments: EXCISION NASAL MASS (Left) - frozen section evaluation, possible skin graft, possible nasal labial flap.  Patient Location: PACU  Anesthesia Type:General  Level of Consciousness: awake, alert , oriented and sedated  Airway & Oxygen Therapy: Patient Spontanous Breathing and aerosol face mask  Post-op Assessment: Report given to RN, Post -op Vital signs reviewed and stable and Patient moving all extremities  Post vital signs: Reviewed and stable  Last Vitals:  Vitals:   06/18/16 0913 06/18/16 1345  BP: 137/78 (!) (P) 134/108  Pulse: 71 (P) 71  Resp: 18 (P) 16  Temp: 36.5 C (P) 36.5 C    Last Pain:  Vitals:   06/18/16 0924  TempSrc:   PainSc: 5       Patients Stated Pain Goal: 5 ([id number])  Complications: No apparent anesthesia complications

## 2016-06-18 NOTE — Discharge Instructions (Signed)
Apply antibiotic ointment to the incisions 3 times daily. Keep everything clean and dry.

## 2016-06-18 NOTE — Anesthesia Preprocedure Evaluation (Addendum)
Anesthesia Evaluation  Patient identified by MRN, date of birth, ID band Patient awake    Reviewed: Allergy & Precautions, NPO status , Patient's Chart, lab work & pertinent test results  History of Anesthesia Complications Negative for: history of anesthetic complications  Airway Mallampati: III  TM Distance: <3 FB Neck ROM: Full    Dental  (+) Teeth Intact, Poor Dentition, Missing   Pulmonary shortness of breath, COPD, former smoker,    breath sounds clear to auscultation       Cardiovascular hypertension, + CAD, + Past MI and + Peripheral Vascular Disease  + dysrhythmias  Rhythm:Regular Rate:Normal     Neuro/Psych    GI/Hepatic GERD  ,  Endo/Other    Renal/GU Renal InsufficiencyRenal disease     Musculoskeletal  (+) Arthritis ,   Abdominal   Peds  Hematology   Anesthesia Other Findings   Reproductive/Obstetrics                            Anesthesia Physical Anesthesia Plan  ASA: III  Anesthesia Plan: General   Post-op Pain Management:    Induction: Intravenous  Airway Management Planned: LMA  Additional Equipment:   Intra-op Plan:   Post-operative Plan: Extubation in OR  Informed Consent: I have reviewed the patients History and Physical, chart, labs and discussed the procedure including the risks, benefits and alternatives for the proposed anesthesia with the patient or authorized representative who has indicated his/her understanding and acceptance.     Plan Discussed with:   Anesthesia Plan Comments:         Anesthesia Quick Evaluation

## 2016-06-18 NOTE — Anesthesia Procedure Notes (Signed)
Procedure Name: Intubation Date/Time: 06/18/2016 11:43 AM Performed by: Scheryl Darter Pre-anesthesia Checklist: Patient identified, Emergency Drugs available, Suction available and Patient being monitored Patient Re-evaluated:Patient Re-evaluated prior to inductionOxygen Delivery Method: Circle System Utilized Preoxygenation: Pre-oxygenation with 100% oxygen Intubation Type: IV induction Ventilation: Mask ventilation without difficulty Laryngoscope Size: Miller and 3 Grade View: Grade II Tube type: Oral Tube size: 7.5 mm Number of attempts: 1 Airway Equipment and Method: Stylet Placement Confirmation: ETT inserted through vocal cords under direct vision,  positive ETCO2 and breath sounds checked- equal and bilateral Secured at: 23 cm Tube secured with: Tape Dental Injury: Teeth and Oropharynx as per pre-operative assessment

## 2016-06-19 ENCOUNTER — Encounter (HOSPITAL_COMMUNITY): Payer: Self-pay | Admitting: Otolaryngology

## 2016-06-20 NOTE — Anesthesia Postprocedure Evaluation (Signed)
Anesthesia Post Note  Patient: Stephen Dean  Procedure(s) Performed: Procedure(s) (LRB): EXCISION NASAL MASS (Left)  Patient location during evaluation: PACU Anesthesia Type: General Level of consciousness: awake and alert Pain management: pain level controlled Vital Signs Assessment: post-procedure vital signs reviewed and stable Respiratory status: spontaneous breathing, nonlabored ventilation, respiratory function stable and patient connected to nasal cannula oxygen Cardiovascular status: blood pressure returned to baseline and stable Postop Assessment: no signs of nausea or vomiting Anesthetic complications: no    Last Vitals:  Vitals:   06/18/16 1530 06/18/16 1540  BP: (!) 141/76 116/68  Pulse:  91  Resp:  18  Temp:      Last Pain:  Vitals:   06/18/16 1345  TempSrc:   PainSc: 0-No pain                 Alissia Lory,JAMES TERRILL

## 2016-08-12 DIAGNOSIS — R350 Frequency of micturition: Secondary | ICD-10-CM | POA: Diagnosis not present

## 2016-08-12 DIAGNOSIS — N390 Urinary tract infection, site not specified: Secondary | ICD-10-CM | POA: Diagnosis not present

## 2016-08-26 DIAGNOSIS — N3020 Other chronic cystitis without hematuria: Secondary | ICD-10-CM | POA: Diagnosis not present

## 2016-09-04 DIAGNOSIS — E7800 Pure hypercholesterolemia, unspecified: Secondary | ICD-10-CM | POA: Diagnosis not present

## 2016-09-04 DIAGNOSIS — R748 Abnormal levels of other serum enzymes: Secondary | ICD-10-CM | POA: Diagnosis not present

## 2016-09-04 DIAGNOSIS — I2510 Atherosclerotic heart disease of native coronary artery without angina pectoris: Secondary | ICD-10-CM | POA: Diagnosis not present

## 2016-09-04 DIAGNOSIS — J449 Chronic obstructive pulmonary disease, unspecified: Secondary | ICD-10-CM | POA: Diagnosis not present

## 2016-09-04 DIAGNOSIS — R69 Illness, unspecified: Secondary | ICD-10-CM | POA: Diagnosis not present

## 2016-09-04 DIAGNOSIS — I714 Abdominal aortic aneurysm, without rupture: Secondary | ICD-10-CM | POA: Diagnosis not present

## 2016-09-04 DIAGNOSIS — I739 Peripheral vascular disease, unspecified: Secondary | ICD-10-CM | POA: Diagnosis not present

## 2016-09-04 DIAGNOSIS — N400 Enlarged prostate without lower urinary tract symptoms: Secondary | ICD-10-CM | POA: Diagnosis not present

## 2016-09-04 DIAGNOSIS — Z1389 Encounter for screening for other disorder: Secondary | ICD-10-CM | POA: Diagnosis not present

## 2016-09-04 DIAGNOSIS — Z0001 Encounter for general adult medical examination with abnormal findings: Secondary | ICD-10-CM | POA: Diagnosis not present

## 2016-10-18 ENCOUNTER — Other Ambulatory Visit: Payer: Self-pay | Admitting: Pulmonary Disease

## 2016-11-18 ENCOUNTER — Encounter: Payer: Self-pay | Admitting: Pulmonary Disease

## 2016-11-18 ENCOUNTER — Ambulatory Visit (INDEPENDENT_AMBULATORY_CARE_PROVIDER_SITE_OTHER): Payer: Medicare HMO | Admitting: Pulmonary Disease

## 2016-11-18 ENCOUNTER — Telehealth: Payer: Self-pay | Admitting: Cardiovascular Disease

## 2016-11-18 VITALS — BP 80/40 | HR 61 | Ht 71.0 in | Wt 210.0 lb

## 2016-11-18 DIAGNOSIS — J439 Emphysema, unspecified: Secondary | ICD-10-CM

## 2016-11-18 NOTE — Telephone Encounter (Signed)
Pt just came from primary doctor and was told to call Cardiologist. Pt's blood pressure was 80/40,pt is on blood pressure medicine.Pt has no energy.

## 2016-11-18 NOTE — Telephone Encounter (Signed)
Noted low blood pressure with pulse within normal range.  Recommendation:  Hold HCTZ for 3 days, then resume if BP stable.  F/U with Dr Claiborne Billings as scheduled.

## 2016-11-18 NOTE — Telephone Encounter (Signed)
Recommendations relayed to patient's wife, who voiced understanding and thanks.

## 2016-11-18 NOTE — Progress Notes (Signed)
DONOVEN PETT    [id number]    [birth year]  Primary Care Physician:POLITE,RONALD D, MD  Referring Physician: Seward Carol, MD [address] [place], [place] [address]  Chief complaint:   Follow up for Severe COPD, FEV1 42%  HPI: Mr. Stephen Dean is a 83 year old with extensive smoking history. He has been referred by Dr. Delfina Redwood for his COPD. His chief complaint today is dyspnea on exertion which has been worsening for several years. He has daily cough with occasional sputum production. No fevers, chills, wheezing, hemoptysis. He was started on Symbicort some time ago but he just used it for 2 months and stopped because he did not like using inhalers. He could not tell if it made any difference with his breathing. He has limited mobility because of his leg amputation. He is usually confined to the house and uses a wheelchair when he goes outside.   Interim History: At last visit his inhalers were changed to [facility] from spiriva. He continues on that and is taking it every day. He reports unchanged dyspnea on exertion. Denies any cough, sputum production, fevers, chills, wheezing.  Outpatient Encounter Prescriptions as of 11/18/2016  Medication Sig  . acetaminophen (TYLENOL) 500 MG tablet Take 1,000 mg by mouth every 6 (six) hours as needed for mild pain.  Marland Kitchen aspirin 81 MG tablet Take 81 mg by mouth daily.  Marland Kitchen atenolol (TENORMIN) 50 MG tablet Take 1 tablet (50 mg total) by mouth daily.  . cetirizine (ZYRTEC) 10 MG tablet Take 10 mg by mouth daily.  . citalopram (CELEXA) 20 MG tablet Take 20 mg by mouth daily.  Marland Kitchen dextromethorphan-guaiFENesin (MUCINEX DM) 30-600 MG 12hr tablet Take 1 tablet by mouth 2 (two) times daily as needed for cough.  . hydrochlorothiazide (HYDRODIURIL) 12.5 MG tablet Take 1 tablet (12.5 mg total) by mouth daily.  Marland Kitchen HYDROcodone-acetaminophen (NORCO) 7.5-325 MG tablet Take 1 tablet by mouth every 6 (six) hours as needed for moderate pain.  Marland Kitchen  lansoprazole (PREVACID) 30 MG capsule Take 30 mg by mouth daily at 12 noon.  Marland Kitchen losartan (COZAAR) 25 MG tablet Take 25 mg by mouth daily.  . Multiple Vitamins-Minerals (CENTURY SENIOR PO) Take 1 tablet by mouth daily.  . Omega-3 Fatty Acids (FISH OIL) 1200 MG CAPS Take 1,200 mg by mouth daily.  . pravastatin (PRAVACHOL) 80 MG tablet TAKE 1 TABLET (80 MG TOTAL) BY MOUTH DAILY.  Marland Kitchen promethazine (PHENERGAN) 25 MG suppository Place 1 suppository (25 mg total) rectally every 6 (six) hours as needed for nausea or vomiting.  Marland Kitchen STIOLTO RESPIMAT 2.5-2.5 MCG/ACT AERS INHALE 2 PUFFS INTO THE LUNGS DAILY.  . tamsulosin (FLOMAX) 0.4 MG CAPS capsule Take 0.4 mg by mouth daily.  Marland Kitchen tiotropium (SPIRIVA) 18 MCG inhalation capsule Place 1 capsule (18 mcg total) into inhaler and inhale daily.  . vitamin C (ASCORBIC ACID) 500 MG tablet Take 500 mg by mouth daily.   . vitamin E 400 UNIT capsule Take 400 Units by mouth daily.  . [DISCONTINUED] cephALEXin (KEFLEX) 500 MG capsule Take 1 capsule (500 mg total) by mouth 3 (three) times daily.   No facility-administered encounter medications on file as of 11/18/2016.     Allergies as of 11/18/2016  . (No Known Allergies)    Past Medical History:  Diagnosis Date  . Arthritis   . COPD (chronic obstructive pulmonary disease) ([place])   . Coronary artery disease   . Depression   . Dyspnea   .  GERD (gastroesophageal reflux disease)   . Heart disease   . Hyperlipidemia   . Hypertension   . Infection of kidney    kidney infections  . Myocardial infarction   . Peripheral vascular disease ([place])   . Skin cancer     Past Surgical History:  Procedure Laterality Date  . AMPUTATION  06/18/2012   Procedure: AMPUTATION ABOVE KNEE;  Surgeon: Mal Misty, MD;  Location: [place];  Service: Vascular;  Laterality: Right;  . CARDIAC SURGERY     Stent placement x 2  . COLONOSCOPY    . EMBOLECTOMY  06/17/2012   Procedure: EMBOLECTOMY;  Surgeon: Conrad Benedict, MD;  Location: [place];  Service: Vascular;  Laterality: Right;  Thrombectomy of right tibial arteries,exclusion of right popliteal aneurysm, femoral -popliteal artery bypass graft,four compartment fasciotomies  . EXCISION NASAL MASS Left 06/18/2016   Procedure: EXCISION NASAL MASS;  Surgeon: Izora Gala, MD;  Location: [place];  Service: ENT;  Laterality: Left;  frozen section evaluation, possible skin graft, possible nasal labial flap.  Marland Kitchen EYE SURGERY    . FASCIOTOMY  06/17/2012   Procedure: FASCIOTOMY;  Surgeon: Conrad Jolly, MD;  Location: [place];  Service: Vascular;  Laterality: Right;  four compartment  fasciotomy  . FEMORAL-POPLITEAL BYPASS GRAFT  06/17/2012   Procedure: BYPASS GRAFT FEMORAL-POPLITEAL ARTERY;  Surgeon: Conrad Neillsville, MD;  Location: [facility];  Service: Vascular;  Laterality: Right;  Using 6 mm x 80 cm propaten goretex graft  . HERNIA REPAIR    . INTRAOPERATIVE ARTERIOGRAM  06/17/2012   Procedure: INTRA OPERATIVE ARTERIOGRAM;  Surgeon: Conrad , MD;  Location: [place];  Service: Vascular;  Laterality: Right;    Family History  Problem Relation Age of Onset  . Kidney disease Mother     Social History   Social History  . Marital status: Married    Spouse name: N/A  . Number of children: N/A  . Years of education: N/A   Occupational History  . Not on file.   Social History Main Topics  . Smoking status: Former Smoker    Packs/day: 3.00    Years: 60.00    Types: Cigarettes    Quit date: 06/09/2012  . Smokeless tobacco: Never Used  . Alcohol use No  . Drug use: No  . Sexual activity: Not on file   Other Topics Concern  . Not on file   Social History Narrative   Lives with with   Retired - worked with a [employer] company   2 children   Review of systems: Review of Systems  Constitutional: Negative for fever and chills.  HENT: Negative.   Eyes: Negative for blurred vision.  Respiratory: as per HPI  Cardiovascular: Negative for chest pain and palpitations.  Gastrointestinal:  Negative for vomiting, diarrhea, blood per rectum. Genitourinary: Negative for dysuria, urgency, frequency and hematuria.  Musculoskeletal: Negative for myalgias, back pain and joint pain.  Skin: Negative for itching and rash.  Neurological: Negative for dizziness, tremors, focal weakness, seizures and loss of consciousness.  Endo/Heme/Allergies: Negative for environmental allergies.  Psychiatric/Behavioral: Negative for depression, suicidal ideas and hallucinations.  All other systems reviewed and are negative.  Physical Exam: Blood pressure (!) 80/40, repeat BP 100/50, pulse 61, height 5\' 11"  (1.803 m), weight 210 lb (95.3 kg), SpO2 91 %. Gen:      No acute distress HEENT:  EOMI, sclera anicteric Neck:     No masses; no thyromegaly Lungs:    Clear  to auscultation bilaterally; normal respiratory effort CV:         Regular rate and rhythm; no murmurs Abd:      + bowel sounds; soft, non-tender; no palpable masses, no distension Ext:    No edema; adequate peripheral perfusion Skin:      Warm and dry; no rash Neuro: alert and oriented x 3 Psych: normal mood and affect  Data Reviewed: Chest x-ray 08/09/13 COPD, No acute cardiopulmonary disease. Image reviewed.  PFTs 01/31/16 FVC 2.30 (57%) FEV1 1.19 (42%) F/F 52 DLCO 39% Severe obstructive lung disease and reduction in diffusion capacity.  Assessment:  Severe COPD  Symptoms are stable on Stiolto. He is not interested in trying other inhalers. He feels that this does not bother him much since he is not very mobile. He'll continue on the same therapy for now.  Low BP 80/40 noted initially. On repeat it has improved to 100/50 which is not too far from his baseline values. Patient is feeling well with no symptoms. Patient and family wish to follow up with Dr. Claiborne Billings for further management. He will continue to montior his BP at home and will go to ED if there is a drop.   Plan/Recommendations:  - Continue stiolto  Marshell Garfinkel  MD [facility] Pager [phone] 11/18/2016, 11:58 AM  CC: Seward Carol, MD

## 2016-11-18 NOTE — Patient Instructions (Signed)
Continue taking the stiolto inhaler Please follow-up with your cardiologist and primary care for adjustment of her blood pressure medication  Please go to the emergency room if you have any dizziness, loss of consciousness, dyspnea, chest pain. Return to clinic in 6 months

## 2016-11-18 NOTE — Telephone Encounter (Signed)
See clinical documentation from today's visit w Dr. Vaughan Browner.  I spoke to patient's wife. He's asymptomatic. Had initial low BP reading of 80/40 which stabilized to 100/50 at same OV. Was instructed to f/u w Dr. Evette Georges office for BP management. Pt overdue for OV (needed 6 month f/u when seen last May). Sched for available slot next week w Dr. Claiborne Billings. (11/25/16)  Wife aware I'll route to pharmD  for consideration of BP change prior to appt (she confirms he's taking atenolol, losartan, HCTZ.) I've also recommended patient take BP readings daily/BID until seen.

## 2016-11-25 ENCOUNTER — Encounter: Payer: Self-pay | Admitting: Cardiovascular Disease

## 2016-11-25 ENCOUNTER — Ambulatory Visit (INDEPENDENT_AMBULATORY_CARE_PROVIDER_SITE_OTHER): Payer: Medicare HMO | Admitting: Cardiovascular Disease

## 2016-11-25 VITALS — BP 94/52 | HR 66 | Ht 71.0 in | Wt 210.0 lb

## 2016-11-25 DIAGNOSIS — G4733 Obstructive sleep apnea (adult) (pediatric): Secondary | ICD-10-CM | POA: Diagnosis not present

## 2016-11-25 DIAGNOSIS — I10 Essential (primary) hypertension: Secondary | ICD-10-CM

## 2016-11-25 DIAGNOSIS — J449 Chronic obstructive pulmonary disease, unspecified: Secondary | ICD-10-CM

## 2016-11-25 DIAGNOSIS — E785 Hyperlipidemia, unspecified: Secondary | ICD-10-CM | POA: Diagnosis not present

## 2016-11-25 DIAGNOSIS — I952 Hypotension due to drugs: Secondary | ICD-10-CM

## 2016-11-25 DIAGNOSIS — I2510 Atherosclerotic heart disease of native coronary artery without angina pectoris: Secondary | ICD-10-CM

## 2016-11-25 DIAGNOSIS — I452 Bifascicular block: Secondary | ICD-10-CM

## 2016-11-25 DIAGNOSIS — I2583 Coronary atherosclerosis due to lipid rich plaque: Secondary | ICD-10-CM

## 2016-11-25 DIAGNOSIS — Z79899 Other long term (current) drug therapy: Secondary | ICD-10-CM | POA: Diagnosis not present

## 2016-11-25 MED ORDER — ATENOLOL 50 MG PO TABS: 25.0000 mg | ORAL_TABLET | Freq: Every day | ORAL | 3 refills | Status: DC

## 2016-11-25 NOTE — Patient Instructions (Addendum)
Your physician recommends that you return for lab work fasting.  Your physician has recommended you make the following change in your medication:   1.) stop the HCTZ  ( FLUID PILL)  2.) THE ATENOLOL HAS BEEN DECREASED TO 25 MG. ( 1/2 TABLET)   Your physician recommends that you schedule a follow-up appointment in: 3 months.

## 2016-11-25 NOTE — Progress Notes (Signed)
Patient ID: Stephen Dean, male   DOB: 05/05/1929, 83 y.o.   MRN: [id number]      HPI: Stephen Dean is a 83 y.o. male who presents to the office today for a 15-monthcardiology evaluation.  Mr. Stephen Dean known coronary as well as peripheral vascular disease. In February 1999 he underwent PTCA of a long 70-80% RCA stenosis and had total occlusion of the circumflex and segmental stenoses of his LAD. He is status post abdominal aortic aneurysm repair and right iliac artery aneurysm repair by Dr. HAmedeo Plenty He has a history of right bundle branch block, hypertension, hyperlipidemia. He required right anterior tibial artery thrombectomy in October 2013 and subsequently underwent right above-the-knee amputation by Dr. LKellie Dean His last nuclear perfusion study in August 2013 was unchanged from previously and showed the old scar in the inferior wall.   His ambulation is limited secondary to his right leg prosthesis and he status post a right AKA.  He denies recurrent anginal symptoms.  He denies palpitations.  He denies presyncope or syncope.  He has chronic right bundle branch block.  He denies edema in the left leg.    He is on atenolol 50 mg, HCTZ 12.5 mg, and losartan 25 mg for hypertension.  Previously, he developed an ACE-induced cough.   He is on Pravachol 80 mg and Lovaza 1000 mg twice a day for hyperlipidemia.  He takes Celexa 10 mg for depression.  He denies any myalgias.  He has been on Stiolto for his COPD> .  He had smoked for up to 65 years and had smoked a 2-3 packs per day.  Smoking 4 years ago.  He was recently seen for his pulmonary evaluation and his blood pressure was low at 80/40.  He denies any chest pressure.  He presents for reevaluation.    Past Medical History:  Diagnosis Date  . Arthritis   . COPD [place] obstructive pulmonary disease) ([place]   . Coronary artery disease   . Depression   . Dyspnea   . GERD (gastroesophageal reflux disease)   . Heart disease   .  Hyperlipidemia   . Hypertension   . Infection of kidney    kidney infections  . Myocardial infarction   . Peripheral vascular disease ([place]   . Skin cancer     Past Surgical History:  Procedure Laterality Date  . AMPUTATION  06/18/2012   Procedure: AMPUTATION ABOVE KNEE;  Surgeon: Stephen Dean;  [place] [facility]  Service: Vascular;  Laterality: Right;  . CARDIAC SURGERY     Stent placement x 2  . COLONOSCOPY    . EMBOLECTOMY  06/17/2012   Procedure: EMBOLECTOMY;  Surgeon: Stephen [place] Dean;  [place] [facility]  Service: Vascular;  Laterality: Right;  Thrombectomy of right tibial arteries,exclusion of right popliteal aneurysm, femoral -popliteal artery bypass graft,four compartment fasciotomies  . EXCISION NASAL MASS Left 06/18/2016   Procedure: EXCISION NASAL MASS;  Surgeon: Stephen Dean;  [place]  Service: ENT;  Laterality: Left;  frozen section evaluation, possible skin graft, possible nasal labial flap.  .Marland KitchenEYE SURGERY    . FASCIOTOMY  06/17/2012   Procedure: FASCIOTOMY;  Surgeon: Stephen [place] Dean;  [place]  Service: Vascular;  Laterality: Right;  four compartment  fasciotomy  . FEMORAL-POPLITEAL BYPASS GRAFT  06/17/2012   Procedure: BYPASS GRAFT FEMORAL-POPLITEAL ARTERY;  Surgeon: Stephen Wy[place]ena Dean;  [place] [facility]  Service: Vascular;  Laterality: Right;  Using 6  mm x 80 cm propaten goretex graft  . HERNIA REPAIR    . INTRAOPERATIVE ARTERIOGRAM  06/17/2012   Procedure: INTRA OPERATIVE ARTERIOGRAM;  Surgeon: Stephen [place], Dean;  Location: [place];  Service: Vascular;  Laterality: Right;    Allergies  Allergen Reactions  . Lisinopril Cough    Current Outpatient Prescriptions  Medication Sig Dispense Refill  . acetaminophen (TYLENOL) 500 MG tablet Take 1,000 mg by mouth every 6 (six) hours as needed for mild pain.    Marland Kitchen aspirin 81 MG tablet Take 81 mg by mouth daily.    Marland Kitchen atenolol (TENORMIN) 50 MG tablet Take 0.5 tablets (25 mg total) by mouth daily. 90  tablet 3  . cetirizine (ZYRTEC) 10 MG tablet Take 10 mg by mouth daily.    . citalopram (CELEXA) 20 MG tablet Take 20 mg by mouth daily.  3  . dextromethorphan-guaiFENesin (MUCINEX DM) 30-600 MG 12hr tablet Take 1 tablet by mouth 2 (two) times daily as needed for cough.    Marland Kitchen HYDROcodone-acetaminophen (NORCO) 7.5-325 MG tablet Take 1 tablet by mouth every 6 (six) hours as needed for moderate pain. 20 tablet 0  . lansoprazole (PREVACID) 30 MG capsule Take 30 mg by mouth daily at 12 noon.    Marland Kitchen losartan (COZAAR) 25 MG tablet Take 25 mg by mouth daily.  3  . Multiple Vitamins-Minerals (CENTURY SENIOR PO) Take 1 tablet by mouth daily.    . Omega-3 Fatty Acids (FISH OIL) 1200 MG CAPS Take 1,200 mg by mouth daily.    . pravastatin (PRAVACHOL) 80 MG tablet TAKE 1 TABLET (80 MG TOTAL) BY MOUTH DAILY. 90 tablet 3  . STIOLTO RESPIMAT 2.5-2.5 MCG/ACT AERS INHALE 2 PUFFS INTO THE LUNGS DAILY. 4 g 5  . tamsulosin (FLOMAX) 0.4 MG CAPS capsule Take 0.4 mg by mouth daily.  6  . vitamin C (ASCORBIC ACID) 500 MG tablet Take 500 mg by mouth daily.     . vitamin E 400 UNIT capsule Take 400 Units by mouth daily.     No current facility-administered medications for this visit.     Social History   Social History  . Marital status: Married    Spouse name: N/A  . Number of children: N/A  . Years of education: N/A   Occupational History  . Not on file.   Social History Main Topics  . Smoking status: Former Smoker    Packs/day: 3.00    Years: 60.00    Types: Cigarettes    Quit date: 06/09/2012  . Smokeless tobacco: Never Used  . Alcohol use No  . Drug use: No  . Sexual activity: Not on file   Other Topics Concern  . Not on file   Social History Narrative   Lives with with   Retired - worked with a [employer] company   2 children   Socially he is married has 2 children one grandchild. There is a remote tobacco history having quit last year. Marland Kitchen  His wife is also my patient.  ROS General: Negative;  No fevers, chills, or night sweats;  HEENT: Positive for difficulty hearing for which he uses hearing aids.  No change in vision., sinus congestion, difficulty swallowing Pulmonary: Positive for COPD.  Occasional wheezing  Cardiovascular: Negative; No chest pain, presyncope, syncope, palpatations GI: Negative; No nausea, vomiting, diarrhea, or abdominal pain GU: Negative; No dysuria, hematuria, or difficulty voiding Musculoskeletal: Positive for right above-the-knee amputation with prosthesis; no myalgias, joint pain, or weakness Hematologic/Oncology: Negative; no  easy bruising, bleeding Endocrine: Negative; no heat/cold intolerance; no diabetes Neuro: Negative; no changes in balance, headaches Skin: Recent move of squamous cell cancer from the left lower asked affect of his nose Psychiatric: Positive for depression, which has improved with Celexa No behavioral problems,  Sleep: Negative; No snoring, daytime sleepiness, hypersomnolence, bruxism, restless legs, hypnogognic hallucinations, no cataplexy Other comprehensive 14 point system review is negative.  PE BP (!) 94/52   Pulse 66   Ht 5' 11"  (1.803 m)   Wt 210 lb (95.3 kg) Comment: ESTIMATE  BMI 29.29 kg/m    Repeat blood pressure by me was 92/54.  Wt Readings from Last 3 Encounters:  11/25/16 210 lb (95.3 kg)  11/18/16 210 lb (95.3 kg)  06/18/16 210 lb 9.6 oz (95.5 kg)   General: Alert, oriented, no distress.  Skin: normal turgor, no rashes HEENT: Normocephalic, atraumatic. Pupils round and reactive; sclera anicteric;no lid lag.  Nose without nasal septal hypertrophy Mouth/Parynx benign; Mallinpatti scale 3 Neck: No JVD, no carotid bruits .  Normal carotid upstroke Lungs: Decreased breath sounds; Very faint intermittent end expiratory wheezing  Chest: Nontender to palpation Heart: PMI not displaced RRR, s1 s2 normal 1/6 systolic murmur.  No S3 or S4 gallop.  No diastolic murmurs, rubs, thrills or heaves. Abdomen:  Well-healed abdominal scar from his aneurysm surgery;soft, nontender; no hepatosplenomehaly, BS+; abdominal aorta nontender and not dilated by palpation. Back: No CVA tenderness Pulses 2+ Extremities: Status post right AKA; trace left ankle edema, no clubbing cyanosis , Homan's sign negative  Neurologic: grossly nonfocal Psychological: Normal affect and mood.  ECG (independently read by me): Sinus rhythm with an occasional PAC.  Right bundle branch block.  Left anterior hemiblock.  No significant ST-T changes  May 2017 ECG (independently read by me): Normal sinus rhythm at 68 bpm.  Right bundle branch block.  Left anterior hemiblock.  October 2016 ECG (independently read by me): normal sinus rhythm at 71 bpm.  Right bundle branch block with repolarization changes.  April 2016 ECG (independently read by me): Normal sinus rhythm at 67 bpm.  Right bundle branch block with repolarization changes.  October 2015 ECG (independently read by me): Normal sinus rhythm at 76 bpm.   Right bundle branch block with repolarization changes.  No ectopy.  Prior April 2015 ECG : Normal sinus rhythm at 73 beats per minute.  Right bundle branch block with repolarization changes.  Isolated PVC.  Prior 03/24/2013 ECG: Normal sinus rhythm with right bundle branch block and probable left anterior fascicular block  LABS: BMP Latest Ref Rng & Units 06/12/2016 12/28/2014 12/27/2013  Glucose 65 - 99 mg/dL 138(H) 95 89  BUN 6 - 20 mg/dL 12 11 14   Creatinine 0.61 - 1.24 mg/dL 0.92 0.92 0.94  Sodium 135 - 145 mmol/L 137 139 141  Potassium 3.5 - 5.1 mmol/L 3.8 3.9 4.0  Chloride 101 - 111 mmol/L 104 100 101  CO2 22 - 32 mmol/L 26 28 32  Calcium 8.9 - 10.3 mg/dL 9.5 9.4 9.5   Hepatic Function Latest Ref Rng & Units 12/28/2014 12/27/2013 06/23/2012  Total Protein 6.0 - 8.3 g/dL 6.9 6.8 5.7(L)  Albumin 3.5 - 5.2 g/dL 4.0 4.0 2.5(L)  AST 0 - 37 U/L 25 24 53(H)  ALT 0 - 53 U/L 22 17 51  Alk Phosphatase 39 - 117 U/L 56 53 59    Total Bilirubin 0.2 - 1.2 mg/dL 0.8 0.9 0.9   CBC Latest Ref Rng & Units 06/12/2016 12/28/2014 12/27/2013  WBC 4.0 - 10.5 K/uL 8.1 6.7 6.4  Hemoglobin 13.0 - 17.0 g/dL 15.2 16.3 15.8  Hematocrit 39.0 - 52.0 % 42.8 46.7 45.5  Platelets 150 - 400 K/uL 171 185 187   Lab Results  Component Value Date   MCV 91.8 06/12/2016   MCV 93.4 12/28/2014   MCV 91.9 12/27/2013   Lab Results  Component Value Date   TSH 1.378 12/27/2013  No results found for: HGBA1C  Lipid Panel     Component Value Date/Time   CHOL 116 12/28/2014 1104   TRIG 153 (H) 12/28/2014 1104   HDL 38 (L) 12/28/2014 1104   CHOLHDL 3.1 12/28/2014 1104   VLDL 31 12/28/2014 1104   LDLCALC 47 12/28/2014 1104     RADIOLOGY: No results found.  IMPRESSION:  1. Coronary artery disease due to lipid rich plaque   2. Essential hypertension   3. Hypotension due to drugs   4. Hyperlipidemia with target LDL less than 70   5. Medication management   6. OSA (obstructive sleep apnea)   7. Right bundle branch block (RBBB) with left anterior hemiblock   8. Chronic obstructive pulmonary disease, unspecified COPD type ([place])     ASSESSMENT AND PLAN: Stephen Dean is an 20 year old white male who has known CAD and is status post intervention to his RCA and has total occlusion of his circumflex with disease in his LAD. His last nuclear perfusion study demonstrated inferior to inferolateral scar concordant with a circumflex occlusion.  He has a history of hypertension.  He developed an ACE induced cough which has resolved with discontinuance of therapy.  Most recently, he has been on atenolol 50 mg, HCTZ 12.5 mg, and losartan 25 mg.  His blood pressure is low.  His ventricular rate is in the 60s.  I have recommended he discontinue HCTZ.  With his severe COPD L also reduce his atenolol down to 25 mg daily.  He will continue taking losartan 25 mg as prescribed and if his blood pressure does elevate this will be further titrated to 50 mg.  He is  now on stiolto respimat for his COPD.  He  Is now followed by Dr. Vaughan Browner of pulmonary. He is on pravastatin and lovaza for hyperlipidemia with his last lipid panel showing a total cholesterol 116, and an excellent LDL at 47.  However, he had very minimal triglyceride elevation at 153, and mild low HDL. His depression is controlled with Celexa 10 mg.  He has bifascicular block with right bundle branch block and left anterior hemiblock.  He is not having recurrent anginal symptoms or significant shortness of breath, but his exercise is limited due to his prosthesis.  He will undergo a complete set of laboratory.   I will see him in 3 months for follow up evaluation.  Time spent: 25 minutes  Troy Sine, Dean, [facility]  11/25/2016 1:08 PM

## 2016-12-10 DIAGNOSIS — N3020 Other chronic cystitis without hematuria: Secondary | ICD-10-CM | POA: Diagnosis not present

## 2016-12-18 DIAGNOSIS — C300 Malignant neoplasm of nasal cavity: Secondary | ICD-10-CM | POA: Diagnosis not present

## 2016-12-18 DIAGNOSIS — C319 Malignant neoplasm of accessory sinus, unspecified: Secondary | ICD-10-CM | POA: Diagnosis not present

## 2016-12-24 DIAGNOSIS — I2510 Atherosclerotic heart disease of native coronary artery without angina pectoris: Secondary | ICD-10-CM | POA: Diagnosis not present

## 2016-12-24 DIAGNOSIS — I10 Essential (primary) hypertension: Secondary | ICD-10-CM | POA: Diagnosis not present

## 2016-12-24 DIAGNOSIS — I2583 Coronary atherosclerosis due to lipid rich plaque: Secondary | ICD-10-CM | POA: Diagnosis not present

## 2016-12-24 DIAGNOSIS — Z79899 Other long term (current) drug therapy: Secondary | ICD-10-CM | POA: Diagnosis not present

## 2016-12-24 DIAGNOSIS — E785 Hyperlipidemia, unspecified: Secondary | ICD-10-CM | POA: Diagnosis not present

## 2016-12-24 LAB — TSH: TSH: 1.19 mIU/L (ref 0.40–4.50)

## 2016-12-25 LAB — LIPID PANEL
Cholesterol: 125 mg/dL (ref <200)
HDL: 40 mg/dL — AB (ref >40)
LDL Cholesterol: 52 mg/dL (ref <100)
TRIGLYCERIDES: 166 mg/dL — AB (ref <150)
Total CHOL/HDL Ratio: 3.1 Ratio (ref <5.0)
VLDL: 33 mg/dL — ABNORMAL HIGH (ref <30)

## 2016-12-25 LAB — COMPREHENSIVE METABOLIC PANEL
ALBUMIN: 3.9 g/dL (ref 3.6–5.1)
ALK PHOS: 54 U/L (ref 40–115)
ALT: 19 U/L (ref 9–46)
AST: 25 U/L (ref 10–35)
BILIRUBIN TOTAL: 0.8 mg/dL (ref 0.2–1.2)
BUN: 12 mg/dL (ref 7–25)
CALCIUM: 9 mg/dL (ref 8.6–10.3)
CO2: 25 mmol/L (ref 20–31)
Chloride: 104 mmol/L (ref 98–110)
Creat: 1.03 mg/dL (ref 0.70–1.11)
Glucose, Bld: 104 mg/dL — ABNORMAL HIGH (ref 65–99)
POTASSIUM: 4.2 mmol/L (ref 3.5–5.3)
Sodium: 142 mmol/L (ref 135–146)
Total Protein: 7.1 g/dL (ref 6.1–8.1)

## 2017-01-05 ENCOUNTER — Telehealth: Payer: Self-pay | Admitting: *Deleted

## 2017-01-05 NOTE — Telephone Encounter (Signed)
Left results and recommendations on patient's voice mail. ( ok per dpr)

## 2017-01-05 NOTE — Telephone Encounter (Signed)
-----   Message from Troy Sine, MD sent at 01/05/2017  6:52 AM EDT ----- Labs are good, but triglycerides remain slightly elevated.  Improve diet.

## 2017-02-17 ENCOUNTER — Ambulatory Visit: Payer: Medicare HMO | Admitting: Internal Medicine

## 2017-02-22 ENCOUNTER — Telehealth: Payer: Self-pay | Admitting: Nurse Practitioner

## 2017-02-22 NOTE — Telephone Encounter (Signed)
   Pts wife called to report that he has been feeling poorly today - cough, dyspnea, malaise.  On top of that, his blood pressure has been low - high 80's to low 90's.  He had similar blood pressure reading in April, when he last saw Dr. Claiborne Billings.  He has already taken his daily doses of losartan 25 and atenolol 25.  He has not had presyncope.  I rec that if he is feeling poorly, he would likely benefit from being seen today - either @ urgent care of ER. His wife will see what he wants to do.  She was hopeful to maybe have him seen in the office tomorrow, but will call back in AM if they don't get seen tonight.   Murray Hodgkins, NP 02/22/2017, 3:22 PM

## 2017-02-23 ENCOUNTER — Telehealth: Payer: Self-pay | Admitting: Cardiovascular Disease

## 2017-02-23 NOTE — Telephone Encounter (Signed)
New message    Pt wife verbalized that she was told by PA this weekend to call in on Monday 02/23/2017 for a work in

## 2017-02-23 NOTE — Telephone Encounter (Signed)
Per NP note-seen today - either @ urgent care of ER. No appts available-notified to no to UC or ER-verbalized  Understanding.

## 2017-03-01 ENCOUNTER — Other Ambulatory Visit: Payer: Self-pay | Admitting: Cardiovascular Disease

## 2017-03-05 ENCOUNTER — Ambulatory Visit
Admission: RE | Admit: 2017-03-05 | Discharge: 2017-03-05 | Disposition: A | Discharge status: Home / Self Care | Payer: Medicare HMO | Source: Ambulatory Visit | Attending: Internal Medicine | Admitting: Internal Medicine

## 2017-03-05 ENCOUNTER — Other Ambulatory Visit: Payer: Self-pay | Admitting: Internal Medicine

## 2017-03-05 DIAGNOSIS — Z66 Do not resuscitate: Secondary | ICD-10-CM | POA: Diagnosis not present

## 2017-03-05 DIAGNOSIS — R0902 Hypoxemia: Secondary | ICD-10-CM | POA: Diagnosis not present

## 2017-03-05 DIAGNOSIS — J90 Pleural effusion, not elsewhere classified: Secondary | ICD-10-CM | POA: Diagnosis not present

## 2017-03-05 DIAGNOSIS — J449 Chronic obstructive pulmonary disease, unspecified: Secondary | ICD-10-CM | POA: Diagnosis not present

## 2017-03-05 DIAGNOSIS — N390 Urinary tract infection, site not specified: Secondary | ICD-10-CM | POA: Diagnosis not present

## 2017-03-05 DIAGNOSIS — R7301 Impaired fasting glucose: Secondary | ICD-10-CM | POA: Diagnosis not present

## 2017-03-05 DIAGNOSIS — I10 Essential (primary) hypertension: Secondary | ICD-10-CM | POA: Diagnosis not present

## 2017-03-05 DIAGNOSIS — E7800 Pure hypercholesterolemia, unspecified: Secondary | ICD-10-CM | POA: Diagnosis not present

## 2017-03-06 DIAGNOSIS — R0902 Hypoxemia: Secondary | ICD-10-CM | POA: Diagnosis not present

## 2017-03-06 DIAGNOSIS — N390 Urinary tract infection, site not specified: Secondary | ICD-10-CM | POA: Diagnosis not present

## 2017-03-06 DIAGNOSIS — J449 Chronic obstructive pulmonary disease, unspecified: Secondary | ICD-10-CM | POA: Diagnosis not present

## 2017-03-06 DIAGNOSIS — Z89619 Acquired absence of unspecified leg above knee: Secondary | ICD-10-CM | POA: Diagnosis not present

## 2017-03-11 ENCOUNTER — Other Ambulatory Visit: Payer: Self-pay | Admitting: Cardiovascular Disease

## 2017-03-11 NOTE — Telephone Encounter (Signed)
Rx has been sent to the pharmacy electronically. ° °

## 2017-03-13 ENCOUNTER — Ambulatory Visit
Admission: RE | Admit: 2017-03-13 | Discharge: 2017-03-13 | Disposition: A | Discharge status: Home / Self Care | Payer: Medicare HMO | Source: Ambulatory Visit | Attending: Internal Medicine | Admitting: Internal Medicine

## 2017-03-13 ENCOUNTER — Other Ambulatory Visit: Payer: Self-pay | Admitting: Internal Medicine

## 2017-03-13 DIAGNOSIS — J90 Pleural effusion, not elsewhere classified: Secondary | ICD-10-CM | POA: Diagnosis not present

## 2017-03-13 DIAGNOSIS — R0600 Dyspnea, unspecified: Secondary | ICD-10-CM | POA: Diagnosis not present

## 2017-03-16 ENCOUNTER — Other Ambulatory Visit (HOSPITAL_COMMUNITY): Payer: Self-pay | Admitting: Internal Medicine

## 2017-03-16 DIAGNOSIS — R0600 Dyspnea, unspecified: Secondary | ICD-10-CM

## 2017-03-19 ENCOUNTER — Encounter (HOSPITAL_COMMUNITY): Payer: Self-pay | Admitting: Nurse Practitioner

## 2017-03-19 ENCOUNTER — Other Ambulatory Visit: Payer: Self-pay

## 2017-03-19 ENCOUNTER — Emergency Department (HOSPITAL_COMMUNITY)
Admission: EM | Admit: 2017-03-19 | Discharge: 2017-03-19 | Disposition: A | Discharge status: Home / Self Care | Payer: Medicare HMO | Attending: Emergency Medicine | Admitting: Emergency Medicine

## 2017-03-19 ENCOUNTER — Emergency Department (HOSPITAL_COMMUNITY): Payer: Medicare HMO

## 2017-03-19 ENCOUNTER — Ambulatory Visit (HOSPITAL_COMMUNITY)
Admission: RE | Admit: 2017-03-19 | Discharge: 2017-03-19 | Disposition: A | Discharge status: Home / Self Care | Payer: Medicare HMO | Source: Ambulatory Visit | Attending: Radiology | Admitting: Radiology

## 2017-03-19 ENCOUNTER — Ambulatory Visit (HOSPITAL_COMMUNITY)
Admission: RE | Admit: 2017-03-19 | Discharge: 2017-03-19 | Disposition: A | Discharge status: Home / Self Care | Payer: Medicare HMO | Source: Ambulatory Visit | Attending: Internal Medicine | Admitting: Internal Medicine

## 2017-03-19 DIAGNOSIS — I739 Peripheral vascular disease, unspecified: Secondary | ICD-10-CM | POA: Insufficient documentation

## 2017-03-19 DIAGNOSIS — R0602 Shortness of breath: Secondary | ICD-10-CM | POA: Diagnosis not present

## 2017-03-19 DIAGNOSIS — Y929 Unspecified place or not applicable: Secondary | ICD-10-CM | POA: Insufficient documentation

## 2017-03-19 DIAGNOSIS — R0682 Tachypnea, not elsewhere classified: Secondary | ICD-10-CM | POA: Insufficient documentation

## 2017-03-19 DIAGNOSIS — Z7982 Long term (current) use of aspirin: Secondary | ICD-10-CM | POA: Diagnosis not present

## 2017-03-19 DIAGNOSIS — R0600 Dyspnea, unspecified: Secondary | ICD-10-CM | POA: Insufficient documentation

## 2017-03-19 DIAGNOSIS — Y999 Unspecified external cause status: Secondary | ICD-10-CM | POA: Insufficient documentation

## 2017-03-19 DIAGNOSIS — J449 Chronic obstructive pulmonary disease, unspecified: Secondary | ICD-10-CM | POA: Insufficient documentation

## 2017-03-19 DIAGNOSIS — I2510 Atherosclerotic heart disease of native coronary artery without angina pectoris: Secondary | ICD-10-CM | POA: Insufficient documentation

## 2017-03-19 DIAGNOSIS — R531 Weakness: Secondary | ICD-10-CM | POA: Diagnosis not present

## 2017-03-19 DIAGNOSIS — I959 Hypotension, unspecified: Secondary | ICD-10-CM

## 2017-03-19 DIAGNOSIS — J811 Chronic pulmonary edema: Secondary | ICD-10-CM | POA: Diagnosis not present

## 2017-03-19 DIAGNOSIS — Y939 Activity, unspecified: Secondary | ICD-10-CM | POA: Insufficient documentation

## 2017-03-19 DIAGNOSIS — R031 Nonspecific low blood-pressure reading: Secondary | ICD-10-CM | POA: Diagnosis not present

## 2017-03-19 DIAGNOSIS — S199XXA Unspecified injury of neck, initial encounter: Secondary | ICD-10-CM | POA: Diagnosis not present

## 2017-03-19 DIAGNOSIS — Z79899 Other long term (current) drug therapy: Secondary | ICD-10-CM | POA: Diagnosis not present

## 2017-03-19 DIAGNOSIS — I10 Essential (primary) hypertension: Secondary | ICD-10-CM | POA: Insufficient documentation

## 2017-03-19 DIAGNOSIS — J90 Pleural effusion, not elsewhere classified: Secondary | ICD-10-CM | POA: Insufficient documentation

## 2017-03-19 DIAGNOSIS — R846 Abnormal cytological findings in specimens from respiratory organs and thorax: Secondary | ICD-10-CM | POA: Diagnosis not present

## 2017-03-19 DIAGNOSIS — W1830XA Fall on same level, unspecified, initial encounter: Secondary | ICD-10-CM | POA: Diagnosis not present

## 2017-03-19 DIAGNOSIS — S0990XA Unspecified injury of head, initial encounter: Secondary | ICD-10-CM | POA: Diagnosis not present

## 2017-03-19 DIAGNOSIS — Z9889 Other specified postprocedural states: Secondary | ICD-10-CM

## 2017-03-19 LAB — CBC WITH DIFFERENTIAL/PLATELET
BASOS ABS: 0 10*3/uL (ref 0.0–0.1)
BASOS PCT: 0 %
EOS PCT: 1 %
Eosinophils Absolute: 0.1 10*3/uL (ref 0.0–0.7)
HEMATOCRIT: 39.7 % (ref 39.0–52.0)
Hemoglobin: 13.5 g/dL (ref 13.0–17.0)
LYMPHS PCT: 9 %
Lymphs Abs: 1.2 10*3/uL (ref 0.7–4.0)
MCH: 31.9 pg (ref 26.0–34.0)
MCHC: 34 g/dL (ref 30.0–36.0)
MCV: 93.9 fL (ref 78.0–100.0)
MONO ABS: 1.5 10*3/uL — AB (ref 0.1–1.0)
MONOS PCT: 11 %
NEUTROS ABS: 10.2 10*3/uL — AB (ref 1.7–7.7)
Neutrophils Relative %: 79 %
PLATELETS: 280 10*3/uL (ref 150–400)
RBC: 4.23 MIL/uL (ref 4.22–5.81)
RDW: 13.6 % (ref 11.5–15.5)
WBC: 13.1 10*3/uL — ABNORMAL HIGH (ref 4.0–10.5)

## 2017-03-19 LAB — COMPREHENSIVE METABOLIC PANEL
ALBUMIN: 2.8 g/dL — AB (ref 3.5–5.0)
ALK PHOS: 113 U/L (ref 38–126)
ALT: 50 U/L (ref 17–63)
AST: 37 U/L (ref 15–41)
Anion gap: 9 (ref 5–15)
BILIRUBIN TOTAL: 1 mg/dL (ref 0.3–1.2)
BUN: 26 mg/dL — AB (ref 6–20)
CALCIUM: 9.5 mg/dL (ref 8.9–10.3)
CO2: 29 mmol/L (ref 22–32)
Chloride: 98 mmol/L — ABNORMAL LOW (ref 101–111)
Creatinine, Ser: 1.35 mg/dL — ABNORMAL HIGH (ref 0.61–1.24)
GFR calc Af Amer: 54 mL/min — ABNORMAL LOW (ref >60)
GFR calc non Af Amer: 46 mL/min — ABNORMAL LOW (ref >60)
GLUCOSE: 132 mg/dL — AB (ref 65–99)
Potassium: 4.3 mmol/L (ref 3.5–5.1)
SODIUM: 136 mmol/L (ref 135–145)
TOTAL PROTEIN: 6.8 g/dL (ref 6.5–8.1)

## 2017-03-19 LAB — URINALYSIS, ROUTINE W REFLEX MICROSCOPIC
BACTERIA UA: NONE SEEN
Bilirubin Urine: NEGATIVE
Glucose, UA: NEGATIVE mg/dL
Hgb urine dipstick: NEGATIVE
KETONES UR: NEGATIVE mg/dL
NITRITE: NEGATIVE
PROTEIN: NEGATIVE mg/dL
Specific Gravity, Urine: 1.027 (ref 1.005–1.030)
pH: 5 (ref 5.0–8.0)

## 2017-03-19 LAB — BODY FLUID CELL COUNT WITH DIFFERENTIAL
EOS FL: 1 %
Lymphs, Fluid: 78 %
MONOCYTE-MACROPHAGE-SEROUS FLUID: 12 % — AB (ref 50–90)
Neutrophil Count, Fluid: 9 % (ref 0–25)
WBC FLUID: 2631 uL — AB (ref 0–1000)

## 2017-03-19 LAB — ALBUMIN, PLEURAL OR PERITONEAL FLUID: Albumin, Fluid: 2.4 g/dL

## 2017-03-19 LAB — LACTATE DEHYDROGENASE, PLEURAL OR PERITONEAL FLUID: LD, Fluid: 154 U/L — ABNORMAL HIGH (ref 3–23)

## 2017-03-19 LAB — GRAM STAIN

## 2017-03-19 LAB — PROTEIN, PLEURAL OR PERITONEAL FLUID: Total protein, fluid: 4.5 g/dL

## 2017-03-19 LAB — CBG MONITORING, ED: Glucose-Capillary: 145 mg/dL — ABNORMAL HIGH (ref 65–99)

## 2017-03-19 LAB — I-STAT CG4 LACTIC ACID, ED: Lactic Acid, Venous: 1.8 mmol/L (ref 0.5–1.9)

## 2017-03-19 MED ORDER — SODIUM CHLORIDE 0.9 % IV BOLUS (SEPSIS)
500.0000 mL | Freq: Once | INTRAVENOUS | Status: AC
  Administered 2017-03-19: 500 mL via INTRAVENOUS

## 2017-03-19 NOTE — Procedures (Signed)
Ultrasound-guided diagnostic and therapeutic right thoracentesis performed yielding 1.7 liters of hazy,amber colored fluid. No immediate complications. Follow-up chest x-ray pending. A portion of the fluid was sent to the lab for preordered studies.

## 2017-03-19 NOTE — ED Provider Notes (Signed)
[facility] DEPT Provider Note   CSN: [id number] Arrival date & time: 03/19/17  1809     History   Chief Complaint No chief complaint on file.   HPI Stephen Dean is a 83 y.o. male.  Patient is a 83 year old male with a history of hypertension, coronary artery disease, COPD, peripheral vascular disease status post femoropopliteal bypass and right AKA, AAA status post repair who presents with fatigue and hypotension. Per his wife and son, he's had progressive weakness over the last several weeks. Since yesterday they've noticed that he is not able to ambulate with his walker.  He had a fall yesterday where he fell backward and hit his head on the ground. He's had falls and weakness in the past but it seems to have been progressively worsening over the last few weeks. He was recently diagnosed by his PCP with a pleural effusion which he had a thoracentesis for today. He seemed to be a little more active and feeling well but better after he had the thoracentesis but by the time they got home he was weaker again. They noticed his blood pressure to be in the 66Q systolic. They brought him over here for further evaluation. He currently denies any pain. He was complaining of some chest pain which resolved after the thoracentesis. He denies abdominal pain. No nausea or vomiting. No recent fevers. No diarrhea. No increased difficulty with urination.      Past Medical History:  Diagnosis Date  . Arthritis   . COPD (chronic obstructive pulmonary disease) ([place])   . Coronary artery disease   . Depression   . Dyspnea   . GERD (gastroesophageal reflux disease)   . Heart disease   . Hyperlipidemia   . Hypertension   . Infection of kidney    kidney infections  . Myocardial infarction ([place])   . Peripheral vascular disease ([place])   . Skin cancer     Patient Active Problem List   Diagnosis Date Noted  . Right bundle branch block 06/23/2014  . H/O abdominal aortic aneurysm repair  12/22/2013  . CAD (coronary artery disease) 04/02/2013  . Hyperlipidemia with target LDL less than 70 04/02/2013  . HTN (hypertension) 04/02/2013  . Cellulitis and abscess of leg 08/10/2012  . Other postoperative infection 08/06/2012  . Popliteal aneurysm ([place]) 07/30/2012  . PVD (peripheral vascular disease) ([place]) 07/13/2012  . S/P AKA (above knee amputation) ([place]) 06/22/2012    Past Surgical History:  Procedure Laterality Date  . AMPUTATION  06/18/2012   Procedure: AMPUTATION ABOVE KNEE;  Surgeon: Mal Misty, MD;  Location: [facility];  Service: Vascular;  Laterality: Right;  . CARDIAC SURGERY     Stent placement x 2  . COLONOSCOPY    . EMBOLECTOMY  06/17/2012   Procedure: EMBOLECTOMY;  Surgeon: Conrad New Kent, MD;  Location: [facility];  Service: Vascular;  Laterality: Right;  Thrombectomy of right tibial arteries,exclusion of right popliteal aneurysm, femoral -popliteal artery bypass graft,four compartment fasciotomies  . EXCISION NASAL MASS Left 06/18/2016   Procedure: EXCISION NASAL MASS;  Surgeon: Izora Gala, MD;  Location: [facility];  Service: ENT;  Laterality: Left;  frozen section evaluation, possible skin graft, possible nasal labial flap.  Marland Kitchen EYE SURGERY    . FASCIOTOMY  06/17/2012   Procedure: FASCIOTOMY;  Surgeon: Conrad , MD;  Location: [facility];  Service: Vascular;  Laterality: Right;  four compartment  fasciotomy  . FEMORAL-POPLITEAL BYPASS GRAFT  06/17/2012   Procedure: BYPASS GRAFT FEMORAL-POPLITEAL ARTERY;  Surgeon: Conrad Newark, MD;  Location: [facility];  Service: Vascular;  Laterality: Right;  Using 6 mm x 80 cm propaten goretex graft  . HERNIA REPAIR    . INTRAOPERATIVE ARTERIOGRAM  06/17/2012   Procedure: INTRA OPERATIVE ARTERIOGRAM;  Surgeon: Conrad Firebaugh, MD;  Location: [facility];  Service: Vascular;  Laterality: Right;       Home Medications    Prior to Admission medications   Medication Sig Start Date End Date Taking? Authorizing Provider  acetaminophen (TYLENOL) 500  MG tablet Take 1,000 mg by mouth every 6 (six) hours as needed for mild pain.   Yes [provider]  aspirin 81 MG tablet Take 81 mg by mouth daily.   Yes [provider]  atenolol (TENORMIN) 50 MG tablet TAKE 1 TABLET (50 MG TOTAL) BY MOUTH DAILY. 03/02/17  Yes Troy Sine, MD  cetirizine (ZYRTEC) 10 MG tablet Take 10 mg by mouth daily.   Yes [provider]  citalopram (CELEXA) 20 MG tablet Take 20 mg by mouth daily. 09/13/15  Yes [provider]  dextromethorphan-guaiFENesin (MUCINEX DM) 30-600 MG 12hr tablet Take 1 tablet by mouth 2 (two) times daily as needed for cough.   Yes [provider]  lansoprazole (PREVACID) 30 MG capsule Take 30 mg by mouth daily at 12 noon.   Yes [provider]  losartan (COZAAR) 25 MG tablet Take 25 mg by mouth daily. 09/21/14  Yes [provider]  moxifloxacin (AVELOX) 400 MG tablet Take 400 mg by mouth daily at 8 pm.   Yes [provider]  Multiple Vitamins-Minerals (CENTURY SENIOR PO) Take 1 tablet by mouth daily.   Yes [provider]  Omega-3 Fatty Acids (FISH OIL) 1200 MG CAPS Take 1,200 mg by mouth daily.   Yes [provider]  pravastatin (PRAVACHOL) 80 MG tablet TAKE 1 TABLET BY MOUTH DAILY. 03/11/17  Yes Troy Sine, MD  STIOLTO RESPIMAT 2.5-2.5 MCG/ACT AERS INHALE 2 PUFFS INTO THE LUNGS DAILY. 10/21/16  Yes Mannam, Praveen, MD  tamsulosin (FLOMAX) 0.4 MG CAPS capsule Take 0.8 mg by mouth at bedtime.  09/16/15  Yes [provider]  vitamin C (ASCORBIC ACID) 500 MG tablet Take 500 mg by mouth daily.    Yes [provider]  vitamin E 400 UNIT capsule Take 400 Units by mouth daily.   Yes [provider]  atenolol (TENORMIN) 50 MG tablet Take 0.5 tablets (25 mg total) by mouth daily. Patient not taking: Reported on 03/19/2017 11/25/16   Troy Sine, MD  HYDROcodone-acetaminophen (NORCO) 7.5-325 MG tablet Take 1 tablet by mouth every 6 (six)  hours as needed for moderate pain. Patient not taking: Reported on 03/19/2017 06/18/16   Izora Gala, MD    Family History Family History  Problem Relation Age of Onset  . Kidney disease Mother     Social History Social History  Substance Use Topics  . Smoking status: Former Smoker    Packs/day: 3.00    Years: 60.00    Types: Cigarettes    Quit date: 06/09/2012  . Smokeless tobacco: Never Used  . Alcohol use No     Allergies   Lisinopril   Review of Systems Review of Systems  Constitutional: Positive for fatigue. Negative for chills, diaphoresis and fever.  HENT: Negative for congestion, rhinorrhea and sneezing.   Eyes: Negative.   Respiratory: Positive for shortness of breath (at baseline per pt). Negative for cough and chest tightness.   Cardiovascular:  Negative for chest pain and leg swelling.  Gastrointestinal: Negative for abdominal pain, blood in stool, diarrhea, nausea and vomiting.  Genitourinary: Negative for difficulty urinating, flank pain, frequency and hematuria.  Musculoskeletal: Negative for arthralgias and back pain.  Skin: Negative for rash.  Neurological: Positive for weakness. Negative for dizziness, speech difficulty, numbness and headaches.     Physical Exam Updated Vital Signs BP 130/72   Pulse 75   Temp 98.3 F (36.8 C) (Oral)   Resp 20   Ht 5\' 11"  (1.803 m)   Wt 90.7 kg (200 lb)   SpO2 93%   BMI 27.89 kg/m   Physical Exam  Constitutional: He is oriented to person, place, and time. He appears well-developed and well-nourished.  HENT:  Head: Normocephalic.  Small hematoma to the posterior scalp  Eyes: Pupils are equal, round, and reactive to light.  Neck: Normal range of motion. Neck supple.  No pain along the cervical thoracic or lumbosacral spine  Cardiovascular: Normal rate, regular rhythm and normal heart sounds.   Pulmonary/Chest: Effort normal and breath sounds normal. Tachypnea noted. No respiratory distress. He has no  wheezes. He has no rales. He exhibits no tenderness.  Abdominal: Soft. Bowel sounds are normal. There is no tenderness. There is no rebound and no guarding.  Musculoskeletal: Normal range of motion. He exhibits no edema.  Status post right AKA  Lymphadenopathy:    He has no cervical adenopathy.  Neurological: He is alert and oriented to person, place, and time.  Moves all extremities symmetrically without focal deficits noted.  Skin: Skin is warm and dry. No rash noted.  Psychiatric: He has a normal mood and affect.     ED Treatments / Results  Labs (all labs ordered are listed, but only abnormal results are displayed) Labs Reviewed  COMPREHENSIVE METABOLIC PANEL - Abnormal; Notable for the following:       Result Value   Chloride 98 (*)    Glucose, Bld 132 (*)    BUN 26 (*)    Creatinine, Ser 1.35 (*)    Albumin 2.8 (*)    GFR calc non Af Amer 46 (*)    GFR calc Af Amer 54 (*)    All other components within normal limits  CBC WITH DIFFERENTIAL/PLATELET - Abnormal; Notable for the following:    WBC 13.1 (*)    Neutro Abs 10.2 (*)    Monocytes Absolute 1.5 (*)    All other components within normal limits  URINALYSIS, ROUTINE W REFLEX MICROSCOPIC - Abnormal; Notable for the following:    Color, Urine AMBER (*)    APPearance HAZY (*)    Leukocytes, UA SMALL (*)    Squamous Epithelial / LPF 0-5 (*)    All other components within normal limits  CBG MONITORING, ED - Abnormal; Notable for the following:    Glucose-Capillary 145 (*)    All other components within normal limits  I-STAT CG4 LACTIC ACID, ED    EKG  EKG Interpretation None     ED ECG REPORT   Date: 03/19/2017  Rate: 71  Rhythm: normal sinus rhythm  QRS Axis: left  Intervals: normal  ST/T Wave abnormalities: nonspecific ST/T changes  Conduction Disutrbances:right bundle branch block and left anterior fascicular block  Narrative Interpretation:   Old EKG Reviewed: unchanged  I have personally reviewed  the EKG tracing and agree with the computerized printout as noted.   Radiology Dg Chest 1 View  Result Date: 03/19/2017 CLINICAL DATA:  Status post  right thoracentesis. EXAM: CHEST 1 VIEW COMPARISON:  03/13/2017 FINDINGS: Heart is mildly enlarged. There are prominent interstitial markings consistent with mild interstitial edema. Right pleural effusion is significantly smaller. No pneumothorax. IMPRESSION: No pneumothorax following right thoracentesis. Interstitial pulmonary edema. Electronically Signed   By: Nolon Nations M.D.   On: 03/19/2017 12:09   Dg Chest 2 View  Result Date: 03/19/2017 CLINICAL DATA:  Shortness of breath. Hypotension. Right thoracentesis earlier today. Patient is on oxygen for severe COPD. EXAM: CHEST  2 VIEW COMPARISON:  03/19/2017 FINDINGS: Mild cardiac enlargement. Pulmonary vascularity is normal. Interstitial pattern to the lungs likely due to edema. Small bilateral pleural effusions, slightly greater on the right. Fluid in the fissures. No significant changes since previous study. No residual or recurrent pneumothorax. Mediastinal contours appear intact. IMPRESSION: No acute change since previous study earlier today. Cardiac enlargement with mild interstitial edema. Small bilateral pleural effusions, greater on the right. Electronically Signed   By: Lucienne Capers M.D.   On: 03/19/2017 19:35   Ct Head Wo Contrast  Result Date: 03/19/2017 CLINICAL DATA:  Head injury due to a fall. Hypotension. Oxygen therapy for severe COPD. EXAM: CT HEAD WITHOUT CONTRAST CT CERVICAL SPINE WITHOUT CONTRAST TECHNIQUE: Multidetector CT imaging of the head and cervical spine was performed following the standard protocol without intravenous contrast. Multiplanar CT image reconstructions of the cervical spine were also generated. COMPARISON:  None. FINDINGS: CT HEAD FINDINGS Brain: Mild cerebral atrophy. Mild ventricular dilatation consistent with central atrophy. Low-attenuation changes in  the deep white matter consistent small vessel ischemia. No evidence of acute infarction, hemorrhage, hydrocephalus, extra-axial collection or mass lesion/mass effect. Vascular: Vascular calcifications are present. Skull: Normal. Negative for fracture or focal lesion. Sinuses/Orbits: No acute finding. Other: None. CT CERVICAL SPINE FINDINGS Evaluation cervical spine is limited due to motion artifact. Alignment: Straightening of usual cervical lordosis. This may be due to patient positioning but can be seen with ligamentous injury or muscle spasm. Skull base and vertebrae: Visualized skull base appears intact. No vertebral compression deformities. No focal bone lesion or bone destruction. C1-2 articulation appears intact. Soft tissues and spinal canal: No prevertebral fluid or swelling. No visible canal hematoma. Disc levels: Degenerative changes throughout the cervical spine with narrowed interspaces and prominent endplate hypertrophic changes. Degenerative changes throughout the cervical facet joints. Upper chest: Emphysematous changes in the lung apices. Other: Vascular calcifications in the cervical carotid artery's. IMPRESSION: No acute intracranial abnormalities. Chronic atrophy and small vessel ischemic changes. Nonspecific straightening of usual cervical lordosis. Diffuse degenerative change throughout the cervical spine. No acute displaced fractures identified. Electronically Signed   By: Lucienne Capers M.D.   On: 03/19/2017 20:24   Ct Cervical Spine Wo Contrast  Result Date: 03/19/2017 CLINICAL DATA:  Head injury due to a fall. Hypotension. Oxygen therapy for severe COPD. EXAM: CT HEAD WITHOUT CONTRAST CT CERVICAL SPINE WITHOUT CONTRAST TECHNIQUE: Multidetector CT imaging of the head and cervical spine was performed following the standard protocol without intravenous contrast. Multiplanar CT image reconstructions of the cervical spine were also generated. COMPARISON:  None. FINDINGS: CT HEAD FINDINGS  Brain: Mild cerebral atrophy. Mild ventricular dilatation consistent with central atrophy. Low-attenuation changes in the deep white matter consistent small vessel ischemia. No evidence of acute infarction, hemorrhage, hydrocephalus, extra-axial collection or mass lesion/mass effect. Vascular: Vascular calcifications are present. Skull: Normal. Negative for fracture or focal lesion. Sinuses/Orbits: No acute finding. Other: None. CT CERVICAL SPINE FINDINGS Evaluation cervical spine is limited due to motion artifact. Alignment: Straightening  of usual cervical lordosis. This may be due to patient positioning but can be seen with ligamentous injury or muscle spasm. Skull base and vertebrae: Visualized skull base appears intact. No vertebral compression deformities. No focal bone lesion or bone destruction. C1-2 articulation appears intact. Soft tissues and spinal canal: No prevertebral fluid or swelling. No visible canal hematoma. Disc levels: Degenerative changes throughout the cervical spine with narrowed interspaces and prominent endplate hypertrophic changes. Degenerative changes throughout the cervical facet joints. Upper chest: Emphysematous changes in the lung apices. Other: Vascular calcifications in the cervical carotid artery's. IMPRESSION: No acute intracranial abnormalities. Chronic atrophy and small vessel ischemic changes. Nonspecific straightening of usual cervical lordosis. Diffuse degenerative change throughout the cervical spine. No acute displaced fractures identified. Electronically Signed   By: Lucienne Capers M.D.   On: 03/19/2017 20:24   US Thoracentesis Asp Pleural Space W/img Guide  Result Date: 03/19/2017 INDICATION: Patient with history of coronary artery disease, COPD, dyspnea, former smoker, right pleural effusion. Request made for diagnostic and therapeutic right thoracentesis. EXAM: ULTRASOUND GUIDED DIAGNOSTIC AND THERAPEUTIC RIGHT THORACENTESIS MEDICATIONS: None. COMPLICATIONS: None  immediate. PROCEDURE: An ultrasound guided thoracentesis was thoroughly discussed with the patient and questions answered. The benefits, risks, alternatives and complications were also discussed. The patient understands and wishes to proceed with the procedure. Written consent was obtained. Ultrasound was performed to localize and mark an adequate pocket of fluid in the right chest. The area was then prepped and draped in the normal sterile fashion. 1% Lidocaine was used for local anesthesia. Under ultrasound guidance a Safe-T-Centesis catheter was introduced. Thoracentesis was performed. The catheter was removed and a dressing applied. FINDINGS: A total of approximately 1.7 liters of hazy, amber fluid was removed. Samples were sent to the laboratory as requested by the clinical team. IMPRESSION: Successful ultrasound guided diagnostic and therapeutic right thoracentesis yielding 1.7 liters of pleural fluid. Read by: Rowe Robert, PA-C Electronically Signed   By: Jerilynn Mages.  Shick M.D.   On: 03/19/2017 11:16    Procedures Procedures (including critical care time)  Medications Ordered in ED Medications  sodium chloride 0.9 % bolus 500 mL (0 mLs Intravenous Stopped 03/19/17 1942)     Initial Impression / Assessment and Plan / ED Course  I have reviewed the triage vital signs and the nursing notes.  Pertinent labs & imaging results that were available during my care of the patient were reviewed by me and considered in my medical decision making (see chart for details).     Patient presents with hypotension and generalized weakness. Seems to be a progressive increase in weakness over the last several weeks. There is no focal neuro deficits. Labs are non-concerning. His creatinine is mildly elevated as compared to his baseline values. He was given 500 cc bolus of IV fluids and is feeling better after this. His blood pressure is stable. On review of prior records. He's frequently got blood pressures in the 90s.  Chest x-ray doesn't show any acute evidence of pneumonia. His white count was little elevated but no other signs of infection are noted. His urinalysis does not appear to be infected but was sent for culture. His lactate is normal. He was little tachypnea on arrival but this seems to have improved and he currently denies any shortness of breath. There is no abdominal pain. This point I don't feel he needs further imaging. He was discharged home in good condition. He has an appointment with pulmonology next week regarding these pleural effusions. I encouraged the  wife and the patient to have close follow-up with his PCP. Return precautions were given.  Final Clinical Impressions(s) / ED Diagnoses   Final diagnoses:  Hypotension, unspecified hypotension type  Generalized weakness    New Prescriptions New Prescriptions   No medications on file     Malvin Johns, MD 03/19/17 2216

## 2017-03-19 NOTE — ED Notes (Signed)
Bed: PN30 Expected date:  Expected time:  Means of arrival:  Comments: EMS 83 y/o family thought pt was hypotensive

## 2017-03-19 NOTE — ED Triage Notes (Signed)
Patients family called EMS due to patient being Hypotensive on their blood pressure machine. EMS arrived and patient blood pressure was 122/80. Patient was at the hospital this morning to remove fluid off his lung. Patient was placed on o2 a few weeks ago due to his severe COPD.

## 2017-03-19 NOTE — ED Notes (Signed)
Patient transported to X-ray 

## 2017-03-21 LAB — URINE CULTURE: Culture: NO GROWTH

## 2017-03-24 ENCOUNTER — Encounter: Payer: Self-pay | Admitting: Pulmonary Disease

## 2017-03-24 ENCOUNTER — Ambulatory Visit (INDEPENDENT_AMBULATORY_CARE_PROVIDER_SITE_OTHER): Payer: Medicare HMO | Admitting: Pulmonary Disease

## 2017-03-24 ENCOUNTER — Ambulatory Visit (INDEPENDENT_AMBULATORY_CARE_PROVIDER_SITE_OTHER)
Admission: RE | Admit: 2017-03-24 | Discharge: 2017-03-24 | Disposition: A | Discharge status: Home / Self Care | Payer: Medicare HMO | Source: Ambulatory Visit | Attending: Pulmonary Disease | Admitting: Pulmonary Disease

## 2017-03-24 ENCOUNTER — Telehealth: Payer: Self-pay | Admitting: Pulmonary Disease

## 2017-03-24 VITALS — BP 110/68 | HR 79 | Ht 71.0 in | Wt 210.0 lb

## 2017-03-24 DIAGNOSIS — R05 Cough: Secondary | ICD-10-CM | POA: Diagnosis not present

## 2017-03-24 DIAGNOSIS — J449 Chronic obstructive pulmonary disease, unspecified: Secondary | ICD-10-CM | POA: Diagnosis not present

## 2017-03-24 DIAGNOSIS — J439 Emphysema, unspecified: Secondary | ICD-10-CM | POA: Diagnosis not present

## 2017-03-24 DIAGNOSIS — R0902 Hypoxemia: Secondary | ICD-10-CM | POA: Diagnosis not present

## 2017-03-24 DIAGNOSIS — J90 Pleural effusion, not elsewhere classified: Secondary | ICD-10-CM | POA: Diagnosis not present

## 2017-03-24 DIAGNOSIS — Z89619 Acquired absence of unspecified leg above knee: Secondary | ICD-10-CM | POA: Diagnosis not present

## 2017-03-24 LAB — CULTURE, BODY FLUID W GRAM STAIN -BOTTLE: Culture: NO GROWTH

## 2017-03-24 LAB — CULTURE, BODY FLUID-BOTTLE

## 2017-03-24 MED ORDER — GLYCOPYRROLATE 25 MCG/ML IN SOLN: 25.0000 mL | Freq: Two times a day (BID) | RESPIRATORY_TRACT | 11 refills | Status: AC

## 2017-03-24 MED ORDER — ARFORMOTEROL TARTRATE 15 MCG/2ML IN NEBU: 15.0000 ug | INHALATION_SOLUTION | Freq: Two times a day (BID) | RESPIRATORY_TRACT | 6 refills | Status: AC

## 2017-03-24 MED ORDER — GLYCOPYRROLATE 25 MCG/ML IN SOLN: 25.0000 mL | Freq: Two times a day (BID) | RESPIRATORY_TRACT | 11 refills | Status: DC

## 2017-03-24 NOTE — Progress Notes (Addendum)
GULED Dean    [id number]    01/25/1929  Primary Care Physician:Polite, Jori Moll, MD  Referring Physician: Seward Carol, MD [address] [place], [place] [address]  Chief complaint:   Follow up for Severe COPD, FEV1 42%  HPI: Mr. Stephen Dean is a 83 year old with extensive smoking history. He has been referred by Dr. Delfina Redwood for his COPD. He has daily cough with occasional sputum production. No fevers, chills, wheezing, hemoptysis. He was started on Symbicort some time ago but he just used it for 2 months and stopped because he did not like using inhalers. He could not tell if it made any difference with his breathing. He has limited mobility because of his leg amputation. He is usually confined to the house and uses a wheelchair when he goes outside.   Interim History: Noted to have increasing dyspnea and CXR that showed RLL effusion. S/p thoracentesis on 7/26. The tests show lymphocytic exudative effusion and cytology shows atypical cells. After the thoracentesis he became hypotensive with generalized weakness and was taken to the ED and given 500 mL IV fluid bolus.  The patient is not very sure but it appears he might have received Avelox for presumed pneumonia during this period  He reports ongoing issues with dyspnea, cough, sputum production. Denies any fevers, chills, wheezing.  Outpatient Encounter Prescriptions as of 03/24/2017  Medication Sig  . acetaminophen (TYLENOL) 500 MG tablet Take 1,000 mg by mouth every 6 (six) hours as needed for mild pain.  Marland Kitchen aspirin 81 MG tablet Take 81 mg by mouth daily.  Marland Kitchen atenolol (TENORMIN) 50 MG tablet TAKE 1 TABLET (50 MG TOTAL) BY MOUTH DAILY.  . cetirizine (ZYRTEC) 10 MG tablet Take 10 mg by mouth daily.  . citalopram (CELEXA) 20 MG tablet Take 20 mg by mouth daily.  Marland Kitchen dextromethorphan-guaiFENesin (MUCINEX DM) 30-600 MG 12hr tablet Take 1 tablet by mouth 2 (two) times daily as needed for cough.  Marland Kitchen  HYDROcodone-acetaminophen (NORCO) 7.5-325 MG tablet Take 1 tablet by mouth every 6 (six) hours as needed for moderate pain.  Marland Kitchen lansoprazole (PREVACID) 30 MG capsule Take 30 mg by mouth daily at 12 noon.  Marland Kitchen losartan (COZAAR) 25 MG tablet Take 25 mg by mouth daily.  . Omega-3 Fatty Acids (FISH OIL) 1200 MG CAPS Take 1,200 mg by mouth daily.  . pravastatin (PRAVACHOL) 80 MG tablet TAKE 1 TABLET BY MOUTH DAILY.  Marland Kitchen STIOLTO RESPIMAT 2.5-2.5 MCG/ACT AERS INHALE 2 PUFFS INTO THE LUNGS DAILY.  . tamsulosin (FLOMAX) 0.4 MG CAPS capsule Take 0.8 mg by mouth at bedtime.   . vitamin C (ASCORBIC ACID) 500 MG tablet Take 500 mg by mouth daily.   . vitamin E 400 UNIT capsule Take 400 Units by mouth daily.  . [DISCONTINUED] atenolol (TENORMIN) 50 MG tablet Take 0.5 tablets (25 mg total) by mouth daily.  . [DISCONTINUED] moxifloxacin (AVELOX) 400 MG tablet Take 400 mg by mouth daily at 8 pm.  . [DISCONTINUED] Multiple Vitamins-Minerals (CENTURY SENIOR PO) Take 1 tablet by mouth daily.   No facility-administered encounter medications on file as of 03/24/2017.     Allergies as of 03/24/2017 - Review Complete 03/24/2017  Allergen Reaction Noted  . Lisinopril Cough 05/02/2016    Past Medical History:  Diagnosis Date  . Arthritis   . COPD (chronic obstructive pulmonary disease) ([facility])   . Coronary artery disease   . Depression   . Dyspnea   . GERD (gastroesophageal  reflux disease)   . Heart disease   . Hyperlipidemia   . Hypertension   . Infection of kidney    kidney infections  . Myocardial infarction ([facility])   . Peripheral vascular disease ([place])   . Skin cancer     Past Surgical History:  Procedure Laterality Date  . AMPUTATION  06/18/2012   Procedure: AMPUTATION ABOVE KNEE;  Surgeon: Mal Misty, MD;  Location: [place];  Service: Vascular;  Laterality: Right;  . CARDIAC SURGERY     Stent placement x 2  . COLONOSCOPY    . EMBOLECTOMY  06/17/2012   Procedure: EMBOLECTOMY;  Surgeon: Conrad Rockport, MD;  Location: [facility];  Service: Vascular;  Laterality: Right;  Thrombectomy of right tibial arteries,exclusion of right popliteal aneurysm, femoral -popliteal artery bypass graft,four compartment fasciotomies  . EXCISION NASAL MASS Left 06/18/2016   Procedure: EXCISION NASAL MASS;  Surgeon: Izora Gala, MD;  Location: [facility];  Service: ENT;  Laterality: Left;  frozen section evaluation, possible skin graft, possible nasal labial flap.  Marland Kitchen EYE SURGERY    . FASCIOTOMY  06/17/2012   Procedure: FASCIOTOMY;  Surgeon: Conrad New Point, MD;  Location: [facility];  Service: Vascular;  Laterality: Right;  four compartment  fasciotomy  . FEMORAL-POPLITEAL BYPASS GRAFT  06/17/2012   Procedure: BYPASS GRAFT FEMORAL-POPLITEAL ARTERY;  Surgeon: Conrad Dustin, MD;  Location[facility];  Service: Vascular;  Laterality: Right;  Using 6 mm x 80 cm propaten goretex graft  . HERNIA REPAIR    . INTRAOPERATIVE ARTERIOGRAM  06/17/2012   Procedure: INTRA OPERATIVE ARTERIOGRAM;  Surgeon: Conrad Clatonia, MD;  Location: [facility];  Service: Vascular;  Laterality: Right;    Family History  Problem Relation Age of Onset  . Kidney disease Mother     Social History   Social History  . Marital status: Married    Spouse name: N/A  . Number of children: N/A  . Years of education: N/A   Occupational History  . Not on file.   Social History Main Topics  . Smoking status: Former Smoker    Packs/day: 3.00    Years: 60.00    Types: Cigarettes    Quit date: 06/09/2012  . Smokeless tobacco: Never Used  . Alcohol use No  . Drug use: No  . Sexual activity: Not on file   Other Topics Concern  . Not on file   Social History Narrative   Lives with with   Retired - worked with a [employer]   2 children   Review of systems: Review of Systems  Constitutional: Negative for fever and chills.  HENT: Negative.   Eyes: Negative for blurred vision.  Respiratory: as per HPI  Cardiovascular: Negative for chest pain and  palpitations.  Gastrointestinal: Negative for vomiting, diarrhea, blood per rectum. Genitourinary: Negative for dysuria, urgency, frequency and hematuria.  Musculoskeletal: Negative for myalgias, back pain and joint pain.  Skin: Negative for itching and rash.  Neurological: Negative for dizziness, tremors, focal weakness, seizures and loss of consciousness.  Endo/Heme/Allergies: Negative for environmental allergies.  Psychiatric/Behavioral: Negative for depression, suicidal ideas and hallucinations.  All other systems reviewed and are negative.  Physical Exam: Blood pressure 110/68, pulse 79, height 5\' 11"  (1.803 m), weight 95.3 kg (210 lb), SpO2 (!) 85 %. Gen:      No acute distress HEENT:  EOMI, sclera anicteric Neck:     No masses; no thyromegaly Lungs:    Clear to auscultation bilaterally; normal  respiratory effort CV:         Regular rate and rhythm; no murmurs Abd:      + bowel sounds; soft, non-tender; no palpable masses, no distension Ext:    No edema; adequate peripheral perfusion Skin:      Warm and dry; no rash Neuro: alert and oriented x 3 Psych: normal mood and affect  Data Reviewed: Chest x-ray 08/09/13 COPD, No acute cardiopulmonary disease. Image reviewed. Chest x-ray 03/05/17- right pleural effusion with basilar infiltrates  PFTs 01/31/16 FVC 2.30 (57%) FEV1 1.19 (42%) F/F 52 DLCO 39% Severe obstructive lung disease and reduction in diffusion capacity.  Pleural fluid 03/19/17 Cytology-Atypical cells LDH 154 WBC 2631, lymphs 78% Microbiology-no growth  Assessment:  Rt pleural effusion This could be parapneumonic. However we need to rule out malignancy given the atypical cells and lymphocyte predominance. I'll repeat a chest x-ray today. If there is persistence of fluid or lung opacities he may need a CT scan and/or repeat thoracentesis.  Severe COPD  He has been maintained on Stiolto but is apparently not able to get the medication effectively. We'll stop  the stiolto and start Brovana and Lonhala nebs to allow ease of delivery.  Plan/Recommendations:  - CXR - Stop stiolto. Start brovana and lonhala nebs  Marshell Garfinkel MD [facility] Pager [phone] 03/24/2017, 11:37 AM  CC: Seward Carol, MD

## 2017-03-24 NOTE — Patient Instructions (Addendum)
Stop the stiolto We will change the medications to brovana bid and lonhala.bid nebulizer  We will get a repeat CXR to see if the effusion has returned. Based on the results you may need a CT scan  Return in 1-2 months

## 2017-03-24 NOTE — Telephone Encounter (Signed)
Lm for Joycelyn Schmid with [facility], as a fax number was not provided.

## 2017-03-25 ENCOUNTER — Telehealth: Payer: Self-pay | Admitting: Cardiovascular Disease

## 2017-03-25 ENCOUNTER — Telehealth: Payer: Self-pay | Admitting: Pulmonary Disease

## 2017-03-25 ENCOUNTER — Other Ambulatory Visit: Payer: Self-pay

## 2017-03-25 DIAGNOSIS — J90 Pleural effusion, not elsewhere classified: Secondary | ICD-10-CM

## 2017-03-25 NOTE — Telephone Encounter (Signed)
Per wife's call pt need a medication adjustment please.  DR [facility]  Pulmonary  Please give her a call with any questions.   Dr's notes are in [place].

## 2017-03-25 NOTE — Telephone Encounter (Signed)
Notes recorded by Marshell Garfinkel, MD on 03/25/2017 at 6:34 AM EDT Please let the patient know that the CXR still shows some fluid, especially on right. Order a CT without contrast for better evaluation.  There is also suggestion that he may be in heart failure. Ask him to contact Dr. Delfina Redwood and Dr. Claiborne Billings to see if his meds can be adjusted. Thanks. _______________________ Patient has an appointment with his PCP tomorrow and sees Dr. Claiborne Billings 8/10. Patient does not take a diuretic so cannot advise that he adjust any medications. Advise to follow PCP recommendations if he makes med changes and the message will be sent to Dr. Claiborne Billings for review.

## 2017-03-25 NOTE — Telephone Encounter (Signed)
Information was printed and faxed to the number given. Will close this message.

## 2017-03-25 NOTE — Telephone Encounter (Signed)
I scheduled CT & called pt to give appt info.  Spoke to pt's wife & she told me she had just called to speak to nurse to give dates that are good for them.  She gave me the days and times.  I will get CT rescheduled for them for a better time and will call her back.  Verified with her she does not need nurse to call her back.  Nothing further needed for this note.

## 2017-03-25 NOTE — Telephone Encounter (Signed)
Fax number [phone] Rx

## 2017-03-26 ENCOUNTER — Other Ambulatory Visit: Payer: Medicare HMO

## 2017-03-26 ENCOUNTER — Telehealth: Payer: Self-pay | Admitting: Pulmonary Disease

## 2017-03-26 MED ORDER — FUROSEMIDE 40 MG PO TABS: 40.0000 mg | ORAL_TABLET | Freq: Every day | ORAL | 2 refills | Status: AC

## 2017-03-26 MED ORDER — AZITHROMYCIN 250 MG PO TABS: ORAL_TABLET | ORAL | 0 refills | Status: AC

## 2017-03-26 NOTE — Telephone Encounter (Signed)
Called spoke with patient's spouse Romie Minus Per Romie Minus, pt's coughing has increased since last ov and pt sounds "really congested."  Pt is still swallowing the mucus, so color is unknown.  Dyspnea is unchanged from last visit.  Pt is scheduled to see Dr Delfina Redwood mid- next week and Dr Claiborne Billings on 8.10.18 Mrs. Shibata is asking if PM can give patient something to help with his cough and the fluid until those upcoming appts  Dr Vaughan Browner please advise, thank you.

## 2017-03-26 NOTE — Telephone Encounter (Signed)
Spoke with pt's wife, Romie Minus. She is aware of PM's recommendations. Rxs have been sent in. Nothing further was needed.

## 2017-03-26 NOTE — Telephone Encounter (Signed)
Call in lasix 40mg  daily. Use it till he can be seen in office next week.  Call in Z pack Ask him to use mucinex OTC  He has a CT scan scheduled for monday. I will review and call him with the results. If he worsens in the meantime then he needs to go to ED.  Marshell Garfinkel MD  Pulmonary and Critical Care 03/26/2017, 1:39 PM

## 2017-03-27 ENCOUNTER — Telehealth: Payer: Self-pay | Admitting: Pulmonary Disease

## 2017-03-27 NOTE — Telephone Encounter (Signed)
I have started the PA via [email or web address] KEY: [place] Jan 14, 1929  This paper has been placed in the blue folder in triage and we will hold this in triage to follow up on PA. I did call the pts wife and make her aware of the process.

## 2017-03-30 ENCOUNTER — Ambulatory Visit (INDEPENDENT_AMBULATORY_CARE_PROVIDER_SITE_OTHER)
Admission: RE | Admit: 2017-03-30 | Discharge: 2017-03-30 | Disposition: A | Discharge status: Home / Self Care | Payer: Medicare HMO | Source: Ambulatory Visit | Attending: Pulmonary Disease | Admitting: Pulmonary Disease

## 2017-03-30 DIAGNOSIS — J90 Pleural effusion, not elsewhere classified: Secondary | ICD-10-CM | POA: Diagnosis not present

## 2017-03-30 NOTE — Telephone Encounter (Signed)
Acknowledge, will assess when I see the patient this week

## 2017-03-31 NOTE — Telephone Encounter (Signed)
Stephen Dean has been denied by the pts insurance company.  PM please advise if you want to proceed with the appeal or change to something else.  Thanks

## 2017-03-31 NOTE — Telephone Encounter (Signed)
Attempted to look up the status of this PA. Cover My Meds website is down at this time. Will check back later.

## 2017-04-01 ENCOUNTER — Emergency Department (HOSPITAL_COMMUNITY): Payer: Medicare HMO

## 2017-04-01 ENCOUNTER — Inpatient Hospital Stay (HOSPITAL_COMMUNITY)
Admission: EM | Admit: 2017-04-01 | Discharge: 2017-04-25 | DRG: 177 | Disposition: E | Payer: Medicare HMO | Attending: Internal Medicine | Admitting: Internal Medicine

## 2017-04-01 ENCOUNTER — Encounter (HOSPITAL_COMMUNITY): Payer: Self-pay | Admitting: Emergency Medicine

## 2017-04-01 DIAGNOSIS — E872 Acidosis: Secondary | ICD-10-CM | POA: Diagnosis present

## 2017-04-01 DIAGNOSIS — Z515 Encounter for palliative care: Secondary | ICD-10-CM

## 2017-04-01 DIAGNOSIS — R069 Unspecified abnormalities of breathing: Secondary | ICD-10-CM | POA: Diagnosis not present

## 2017-04-01 DIAGNOSIS — E785 Hyperlipidemia, unspecified: Secondary | ICD-10-CM | POA: Diagnosis not present

## 2017-04-01 DIAGNOSIS — Z79899 Other long term (current) drug therapy: Secondary | ICD-10-CM

## 2017-04-01 DIAGNOSIS — Z66 Do not resuscitate: Secondary | ICD-10-CM | POA: Diagnosis present

## 2017-04-01 DIAGNOSIS — R0600 Dyspnea, unspecified: Secondary | ICD-10-CM

## 2017-04-01 DIAGNOSIS — Z888 Allergy status to other drugs, medicaments and biological substances status: Secondary | ICD-10-CM

## 2017-04-01 DIAGNOSIS — R1310 Dysphagia, unspecified: Secondary | ICD-10-CM | POA: Diagnosis present

## 2017-04-01 DIAGNOSIS — Z7189 Other specified counseling: Secondary | ICD-10-CM

## 2017-04-01 DIAGNOSIS — J432 Centrilobular emphysema: Secondary | ICD-10-CM | POA: Diagnosis present

## 2017-04-01 DIAGNOSIS — Z89611 Acquired absence of right leg above knee: Secondary | ICD-10-CM

## 2017-04-01 DIAGNOSIS — J690 Pneumonitis due to inhalation of food and vomit: Principal | ICD-10-CM

## 2017-04-01 DIAGNOSIS — N400 Enlarged prostate without lower urinary tract symptoms: Secondary | ICD-10-CM | POA: Diagnosis present

## 2017-04-01 DIAGNOSIS — R69 Illness, unspecified: Secondary | ICD-10-CM | POA: Diagnosis not present

## 2017-04-01 DIAGNOSIS — J9621 Acute and chronic respiratory failure with hypoxia: Secondary | ICD-10-CM | POA: Diagnosis not present

## 2017-04-01 DIAGNOSIS — R0609 Other forms of dyspnea: Secondary | ICD-10-CM | POA: Diagnosis not present

## 2017-04-01 DIAGNOSIS — J90 Pleural effusion, not elsewhere classified: Secondary | ICD-10-CM | POA: Diagnosis not present

## 2017-04-01 DIAGNOSIS — R0902 Hypoxemia: Secondary | ICD-10-CM

## 2017-04-01 DIAGNOSIS — K219 Gastro-esophageal reflux disease without esophagitis: Secondary | ICD-10-CM | POA: Diagnosis present

## 2017-04-01 DIAGNOSIS — I739 Peripheral vascular disease, unspecified: Secondary | ICD-10-CM | POA: Diagnosis present

## 2017-04-01 DIAGNOSIS — I110 Hypertensive heart disease with heart failure: Secondary | ICD-10-CM | POA: Diagnosis present

## 2017-04-01 DIAGNOSIS — J948 Other specified pleural conditions: Secondary | ICD-10-CM | POA: Diagnosis not present

## 2017-04-01 DIAGNOSIS — J441 Chronic obstructive pulmonary disease with (acute) exacerbation: Secondary | ICD-10-CM | POA: Diagnosis not present

## 2017-04-01 DIAGNOSIS — I5022 Chronic systolic (congestive) heart failure: Secondary | ICD-10-CM | POA: Diagnosis not present

## 2017-04-01 DIAGNOSIS — R0602 Shortness of breath: Secondary | ICD-10-CM

## 2017-04-01 DIAGNOSIS — I4891 Unspecified atrial fibrillation: Secondary | ICD-10-CM | POA: Diagnosis present

## 2017-04-01 DIAGNOSIS — I2510 Atherosclerotic heart disease of native coronary artery without angina pectoris: Secondary | ICD-10-CM | POA: Diagnosis not present

## 2017-04-01 DIAGNOSIS — Z87891 Personal history of nicotine dependence: Secondary | ICD-10-CM | POA: Diagnosis not present

## 2017-04-01 DIAGNOSIS — I5023 Acute on chronic systolic (congestive) heart failure: Secondary | ICD-10-CM | POA: Diagnosis not present

## 2017-04-01 DIAGNOSIS — E860 Dehydration: Secondary | ICD-10-CM | POA: Diagnosis present

## 2017-04-01 DIAGNOSIS — R740 Nonspecific elevation of levels of transaminase and lactic acid dehydrogenase [LDH]: Secondary | ICD-10-CM | POA: Diagnosis not present

## 2017-04-01 DIAGNOSIS — J449 Chronic obstructive pulmonary disease, unspecified: Secondary | ICD-10-CM | POA: Diagnosis not present

## 2017-04-01 DIAGNOSIS — I252 Old myocardial infarction: Secondary | ICD-10-CM | POA: Diagnosis not present

## 2017-04-01 DIAGNOSIS — Z9981 Dependence on supplemental oxygen: Secondary | ICD-10-CM

## 2017-04-01 DIAGNOSIS — R627 Adult failure to thrive: Secondary | ICD-10-CM | POA: Diagnosis present

## 2017-04-01 DIAGNOSIS — F329 Major depressive disorder, single episode, unspecified: Secondary | ICD-10-CM | POA: Diagnosis not present

## 2017-04-01 DIAGNOSIS — I10 Essential (primary) hypertension: Secondary | ICD-10-CM | POA: Diagnosis present

## 2017-04-01 DIAGNOSIS — Z23 Encounter for immunization: Secondary | ICD-10-CM

## 2017-04-01 DIAGNOSIS — Z7982 Long term (current) use of aspirin: Secondary | ICD-10-CM | POA: Diagnosis not present

## 2017-04-01 DIAGNOSIS — Z955 Presence of coronary angioplasty implant and graft: Secondary | ICD-10-CM | POA: Diagnosis not present

## 2017-04-01 DIAGNOSIS — R7401 Elevation of levels of liver transaminase levels: Secondary | ICD-10-CM | POA: Diagnosis present

## 2017-04-01 DIAGNOSIS — M1990 Unspecified osteoarthritis, unspecified site: Secondary | ICD-10-CM | POA: Diagnosis present

## 2017-04-01 DIAGNOSIS — F0390 Unspecified dementia without behavioral disturbance: Secondary | ICD-10-CM | POA: Diagnosis not present

## 2017-04-01 DIAGNOSIS — I959 Hypotension, unspecified: Secondary | ICD-10-CM | POA: Diagnosis not present

## 2017-04-01 DIAGNOSIS — Z85828 Personal history of other malignant neoplasm of skin: Secondary | ICD-10-CM | POA: Diagnosis not present

## 2017-04-01 DIAGNOSIS — Z89619 Acquired absence of unspecified leg above knee: Secondary | ICD-10-CM | POA: Diagnosis not present

## 2017-04-01 LAB — PROCALCITONIN: PROCALCITONIN: 0.1 ng/mL

## 2017-04-01 LAB — COMPREHENSIVE METABOLIC PANEL
ALT: 65 U/L — ABNORMAL HIGH (ref 17–63)
AST: 69 U/L — AB (ref 15–41)
Albumin: 2.1 g/dL — ABNORMAL LOW (ref 3.5–5.0)
Alkaline Phosphatase: 135 U/L — ABNORMAL HIGH (ref 38–126)
Anion gap: 9 (ref 5–15)
BILIRUBIN TOTAL: 1.5 mg/dL — AB (ref 0.3–1.2)
BUN: 32 mg/dL — ABNORMAL HIGH (ref 6–20)
CALCIUM: 12.5 mg/dL — AB (ref 8.9–10.3)
CO2: 33 mmol/L — ABNORMAL HIGH (ref 22–32)
Chloride: 97 mmol/L — ABNORMAL LOW (ref 101–111)
Creatinine, Ser: 1.1 mg/dL (ref 0.61–1.24)
GFR calc Af Amer: >60 mL/min (ref >60)
GFR, EST NON AFRICAN AMERICAN: 59 mL/min — AB (ref >60)
Glucose, Bld: 124 mg/dL — ABNORMAL HIGH (ref 65–99)
POTASSIUM: 4.6 mmol/L (ref 3.5–5.1)
Sodium: 139 mmol/L (ref 135–145)
TOTAL PROTEIN: 6.3 g/dL — AB (ref 6.5–8.1)

## 2017-04-01 LAB — CBC WITH DIFFERENTIAL/PLATELET
BASOS ABS: 0 10*3/uL (ref 0.0–0.1)
Basophils Relative: 0 %
Eosinophils Absolute: 0 10*3/uL (ref 0.0–0.7)
Eosinophils Relative: 0 %
HEMATOCRIT: 43 % (ref 39.0–52.0)
Hemoglobin: 14.4 g/dL (ref 13.0–17.0)
LYMPHS PCT: 10 %
Lymphs Abs: 1.5 10*3/uL (ref 0.7–4.0)
MCH: 31.4 pg (ref 26.0–34.0)
MCHC: 33.5 g/dL (ref 30.0–36.0)
MCV: 93.9 fL (ref 78.0–100.0)
MONOS PCT: 6 %
Monocytes Absolute: 0.9 10*3/uL (ref 0.1–1.0)
NEUTROS ABS: 12.9 10*3/uL — AB (ref 1.7–7.7)
Neutrophils Relative %: 84 %
Platelets: 320 10*3/uL (ref 150–400)
RBC: 4.58 MIL/uL (ref 4.22–5.81)
RDW: 13.8 % (ref 11.5–15.5)
WBC: 15.3 10*3/uL — ABNORMAL HIGH (ref 4.0–10.5)

## 2017-04-01 LAB — URINALYSIS, ROUTINE W REFLEX MICROSCOPIC
Bilirubin Urine: NEGATIVE
Glucose, UA: NEGATIVE mg/dL
Hgb urine dipstick: NEGATIVE
KETONES UR: NEGATIVE mg/dL
LEUKOCYTES UA: NEGATIVE
NITRITE: NEGATIVE
PH: 5 (ref 5.0–8.0)
Protein, ur: NEGATIVE mg/dL
SPECIFIC GRAVITY, URINE: 1.016 (ref 1.005–1.030)

## 2017-04-01 LAB — BRAIN NATRIURETIC PEPTIDE: B Natriuretic Peptide: 42.5 pg/mL (ref 0.0–100.0)

## 2017-04-01 LAB — I-STAT VENOUS BLOOD GAS, ED
ACID-BASE EXCESS: 11 mmol/L — AB (ref 0.0–2.0)
Bicarbonate: 35.5 mmol/L — ABNORMAL HIGH (ref 20.0–28.0)
O2 SAT: 82 %
PO2 VEN: 43 mmHg (ref 32.0–45.0)
TCO2: 37 mmol/L (ref 0–100)
pCO2, Ven: 45.7 mmHg (ref 44.0–60.0)
pH, Ven: 7.498 — ABNORMAL HIGH (ref 7.250–7.430)

## 2017-04-01 LAB — I-STAT CG4 LACTIC ACID, ED
LACTIC ACID, VENOUS: 2.54 mmol/L — AB (ref 0.5–1.9)
LACTIC ACID, VENOUS: 1.94 mmol/L — AB (ref 0.5–1.9)

## 2017-04-01 LAB — AMMONIA: AMMONIA: 17 umol/L (ref 9–35)

## 2017-04-01 LAB — LACTIC ACID, PLASMA: Lactic Acid, Venous: 2 mmol/L (ref 0.5–1.9)

## 2017-04-01 LAB — TROPONIN I: Troponin I: <0.03 ng/mL (ref <0.03)

## 2017-04-01 MED ORDER — SODIUM CHLORIDE 0.9 % IV SOLN
INTRAVENOUS | Status: DC
  Administered 2017-04-01: 75 mL/h via INTRAVENOUS
  Administered 2017-04-03 – 2017-04-04 (×3): via INTRAVENOUS

## 2017-04-01 MED ORDER — IPRATROPIUM-ALBUTEROL 0.5-2.5 (3) MG/3ML IN SOLN
3.0000 mL | RESPIRATORY_TRACT | Status: DC
  Administered 2017-04-01 – 2017-04-03 (×10): 3 mL via RESPIRATORY_TRACT
  Filled 2017-04-01 (×9): qty 3

## 2017-04-01 MED ORDER — DEXTROSE 5 % IV SOLN
500.0000 mg | Freq: Once | INTRAVENOUS | Status: AC
  Administered 2017-04-01: 500 mg via INTRAVENOUS
  Filled 2017-04-01: qty 500

## 2017-04-01 MED ORDER — ONDANSETRON HCL 4 MG/2ML IJ SOLN: 4.0000 mg | Freq: Four times a day (QID) | INTRAMUSCULAR | Status: DC | PRN

## 2017-04-01 MED ORDER — GLYCOPYRROLATE 25 MCG/ML IN SOLN: 25.0000 mL | Freq: Two times a day (BID) | RESPIRATORY_TRACT | Status: DC

## 2017-04-01 MED ORDER — SODIUM CHLORIDE 0.9 % IV BOLUS (SEPSIS)
500.0000 mL | Freq: Once | INTRAVENOUS | Status: AC
  Administered 2017-04-01: 500 mL via INTRAVENOUS

## 2017-04-01 MED ORDER — ACETAMINOPHEN 650 MG RE SUPP
650.0000 mg | Freq: Four times a day (QID) | RECTAL | Status: DC | PRN
Start: 2017-04-01 — End: 2017-04-07

## 2017-04-01 MED ORDER — ENOXAPARIN SODIUM 40 MG/0.4ML ~~LOC~~ SOLN
40.0000 mg | Freq: Every day | SUBCUTANEOUS | Status: DC
  Administered 2017-04-02 – 2017-04-06 (×5): 40 mg via SUBCUTANEOUS
  Filled 2017-04-01 (×5): qty 0.4

## 2017-04-01 MED ORDER — ACETAMINOPHEN 325 MG PO TABS: 650.0000 mg | ORAL_TABLET | Freq: Four times a day (QID) | ORAL | Status: DC | PRN

## 2017-04-01 MED ORDER — SODIUM CHLORIDE 0.9 % IV SOLN
1.5000 g | Freq: Four times a day (QID) | INTRAVENOUS | Status: DC
  Administered 2017-04-01 – 2017-04-06 (×20): 1.5 g via INTRAVENOUS
  Filled 2017-04-01 (×23): qty 1.5

## 2017-04-01 MED ORDER — ARFORMOTEROL TARTRATE 15 MCG/2ML IN NEBU
15.0000 ug | INHALATION_SOLUTION | Freq: Two times a day (BID) | RESPIRATORY_TRACT | Status: DC
  Administered 2017-04-02 – 2017-04-03 (×3): 15 ug via RESPIRATORY_TRACT
  Filled 2017-04-01 (×5): qty 2

## 2017-04-01 MED ORDER — SODIUM CHLORIDE 0.9% FLUSH
3.0000 mL | Freq: Two times a day (BID) | INTRAVENOUS | Status: DC
  Administered 2017-04-02 – 2017-04-06 (×7): 3 mL via INTRAVENOUS

## 2017-04-01 MED ORDER — ONDANSETRON HCL 4 MG PO TABS: 4.0000 mg | ORAL_TABLET | Freq: Four times a day (QID) | ORAL | Status: DC | PRN

## 2017-04-01 MED ORDER — DEXTROSE 5 % IV SOLN
1.0000 g | Freq: Once | INTRAVENOUS | Status: AC
  Administered 2017-04-01: 1 g via INTRAVENOUS
  Filled 2017-04-01: qty 10

## 2017-04-01 MED ORDER — CITALOPRAM HYDROBROMIDE 20 MG PO TABS
20.0000 mg | ORAL_TABLET | Freq: Every day | ORAL | Status: DC
  Administered 2017-04-01 – 2017-04-05 (×4): 20 mg via ORAL
  Filled 2017-04-01 (×2): qty 1
  Filled 2017-04-01: qty 2
  Filled 2017-04-01: qty 1

## 2017-04-01 MED ORDER — FUROSEMIDE 10 MG/ML IJ SOLN: 60.0000 mg | Freq: Once | INTRAMUSCULAR | Status: DC

## 2017-04-01 NOTE — ED Notes (Signed)
Assisted pt with urinal

## 2017-04-01 NOTE — ED Notes (Signed)
Tried to take patient off NRB. Placed on 6 L [place], O2 88%. Put back on NRB.

## 2017-04-01 NOTE — ED Provider Notes (Signed)
[facility] DEPT Provider Note   CSN: [id number] Arrival date & time: 04/22/2017  1145     History   Chief Complaint Chief Complaint  Patient presents with  . Shortness of Breath    HPI Stephen Dean is a 83 y.o. male.   Shortness of Breath  This is a recurrent problem. The average episode lasts 2 weeks. The problem occurs continuously.The current episode started more than 1 week ago. The problem has been gradually worsening. Associated symptoms include cough. Pertinent negatives include no fever. Treatments tried: thoracentesis/antibiotics. The treatment provided mild relief. He has had no prior hospitalizations. He has had prior ED visits.    Past Medical History:  Diagnosis Date  . Arthritis   . COPD (chronic obstructive pulmonary disease) ([place])   . Coronary artery disease   . Depression   . Dyspnea   . GERD (gastroesophageal reflux disease)   . Heart disease   . Hyperlipidemia   . Hypertension   . Infection of kidney    kidney infections  . Myocardial infarction ([place])   . Peripheral vascular disease ([place])   . Skin cancer     Patient Active Problem List   Diagnosis Date Noted  . Acute on chronic respiratory failure with hypoxia ([place]) 04/18/2017  . Hyperlipidemia 04/22/2017  . Pleural effusion transudative 03/29/2017  . Hypertension 04/14/2017  . Coronary artery disease 03/28/2017  . Hypercalcemia 04/20/2017  . Transaminitis 04/22/2017  . Aspiration pneumonia ([place]) 03/31/2017  . COPD with acute exacerbation ([place])   . Right bundle branch block 06/23/2014  . H/O abdominal aortic aneurysm repair 12/22/2013  . CAD (coronary artery disease) 04/02/2013  . Hyperlipidemia with target LDL less than 70 04/02/2013  . HTN (hypertension) 04/02/2013  . Cellulitis and abscess of leg 08/10/2012  . Other postoperative infection 08/06/2012  . Popliteal aneurysm ([place]) 07/30/2012  . PVD (peripheral vascular disease) ([place]) 07/13/2012  . S/P AKA (above knee amputation)  ([place]) 06/22/2012    Past Surgical History:  Procedure Laterality Date  . AMPUTATION  06/18/2012   Procedure: AMPUTATION ABOVE KNEE;  Surgeon: Mal Misty, MD;  Location: [facility];  Service: Vascular;  Laterality: Right;  . CARDIAC SURGERY     Stent placement x 2  . COLONOSCOPY    . EMBOLECTOMY  06/17/2012   Procedure: EMBOLECTOMY;  Surgeon: Conrad Conneaut, MD;  Location: [facility];  Service: Vascular;  Laterality: Right;  Thrombectomy of right tibial arteries,exclusion of right popliteal aneurysm, femoral -popliteal artery bypass graft,four compartment fasciotomies  . EXCISION NASAL MASS Left 06/18/2016   Procedure: EXCISION NASAL MASS;  Surgeon: Izora Gala, MD;  Location: [facility];  Service: ENT;  Laterality: Left;  frozen section evaluation, possible skin graft, possible nasal labial flap.  Marland Kitchen EYE SURGERY    . FASCIOTOMY  06/17/2012   Procedure: FASCIOTOMY;  Surgeon: Conrad Grimes, MD;  Location: [facility];  Service: Vascular;  Laterality: Right;  four compartment  fasciotomy  . FEMORAL-POPLITEAL BYPASS GRAFT  06/17/2012   Procedure: BYPASS GRAFT FEMORAL-POPLITEAL ARTERY;  Surgeon: Conrad McLeansville, MD;  Location: [facility];  Service: Vascular;  Laterality: Right;  Using 6 mm x 80 cm propaten goretex graft  . HERNIA REPAIR    . INTRAOPERATIVE ARTERIOGRAM  06/17/2012   Procedure: INTRA OPERATIVE ARTERIOGRAM;  Surgeon: Conrad [place], MD;  Location: [facility];  Service: Vascular;  Laterality: Right;       Home Medications    Prior to Admission medications   Medication Sig Start  Date End Date Taking? Authorizing Provider  acetaminophen (TYLENOL) 500 MG tablet Take 500-1,000 mg by mouth every 6 (six) hours as needed for mild pain or headache.    Yes [provider]  aspirin 81 MG tablet Take 81 mg by mouth daily.   Yes [provider]  atenolol (TENORMIN) 50 MG tablet TAKE 1 TABLET (50 MG TOTAL) BY MOUTH DAILY. Patient taking differently: Take 25 mg by mouth daily.  03/02/17  Yes Troy Sine,  MD  cetirizine (ZYRTEC) 10 MG tablet Take 10 mg by mouth daily.   Yes [provider]  citalopram (CELEXA) 20 MG tablet Take 20 mg by mouth daily. 09/13/15  Yes [provider]  dextromethorphan-guaiFENesin (MUCINEX DM) 30-600 MG 12hr tablet Take 1 tablet by mouth 2 (two) times daily as needed for cough.   Yes [provider]  furosemide (LASIX) 40 MG tablet Take 1 tablet (40 mg total) by mouth daily. Patient taking differently: Take 40 mg by mouth 2 (two) times daily.  03/26/17  Yes Mannam, Praveen, MD  losartan (COZAAR) 25 MG tablet Take 25 mg by mouth daily. 09/21/14  Yes [provider]  Omega-3 Fatty Acids (FISH OIL) 1200 MG CAPS Take 1,200 mg by mouth daily.   Yes [provider]  pravastatin (PRAVACHOL) 80 MG tablet TAKE 1 TABLET BY MOUTH DAILY. Patient taking differently: Take 80 mg by mouth once a day 03/11/17  Yes Troy Sine, MD  pseudoephedrine-guaifenesin [facility] D) 60-600 MG 12 hr tablet Take 1 tablet by mouth every 12 (twelve) hours.   Yes [provider]  STIOLTO RESPIMAT 2.5-2.5 MCG/ACT AERS INHALE 2 PUFFS INTO THE LUNGS DAILY. 10/21/16  Yes Mannam, Praveen, MD  tamsulosin (FLOMAX) 0.4 MG CAPS capsule Take 0.8 mg by mouth at bedtime.  09/16/15  Yes [provider]  vitamin C (ASCORBIC ACID) 500 MG tablet Take 500 mg by mouth daily.    Yes [provider]  vitamin E 400 UNIT capsule Take 400 Units by mouth daily.   Yes [provider]  arformoterol (BROVANA) 15 MCG/2ML NEBU Take 2 mLs (15 mcg total) by nebulization 2 (two) times daily. 03/24/17   Mannam, Hart Robinsons, MD  Glycopyrrolate (LONHALA MAGNAIR STARTER KIT) 25 MCG/ML SOLN Inhale 25 mLs into the lungs 2 (two) times daily. Dx code: j43.9 03/24/17   Marshell Garfinkel, MD  HYDROcodone-acetaminophen (NORCO) 7.5-325 MG tablet Take 1 tablet by mouth every 6 (six) hours as needed for moderate pain. Patient not taking: Reported on 04/18/2017 06/18/16   Izora Gala,  MD    Family History Family History  Problem Relation Age of Onset  . Kidney disease Mother     Social History Social History  Substance Use Topics  . Smoking status: Former Smoker    Packs/day: 3.00    Years: 60.00    Types: Cigarettes    Quit date: 06/09/2012  . Smokeless tobacco: Never Used  . Alcohol use No     Allergies   Lisinopril   Review of Systems Review of Systems  Constitutional: Negative for fever.  Respiratory: Positive for cough and shortness of breath.   All other systems reviewed and are negative.    Physical Exam Updated Vital Signs BP 130/75   Pulse 76   Temp 98 F (36.7 C) (Axillary)   Resp (!) 26   Ht 5' 7"  (1.702 m)   Wt 95.3 kg (210 lb)   SpO2 92%   BMI 32.89 kg/m   Physical Exam  Constitutional: He appears well-developed and well-nourished.  HENT:  Head: Normocephalic and atraumatic.  Eyes: Conjunctivae and EOM are normal.  Neck: Normal range of motion.  Cardiovascular: Normal rate.   Pulmonary/Chest: Effort normal. No respiratory distress. He has rales (bilaterally all lung fields).  Abdominal: Soft. He exhibits no distension.  Musculoskeletal: Normal range of motion. He exhibits no edema or deformity.  Neurological: He is alert. No cranial nerve deficit. Coordination normal.  Skin: Skin is warm and dry.  Nursing note and vitals reviewed.    ED Treatments / Results  Labs (all labs ordered are listed, but only abnormal results are displayed) Labs Reviewed  CBC WITH DIFFERENTIAL/PLATELET - Abnormal; Notable for the following:       Result Value   WBC 15.3 (*)    Neutro Abs 12.9 (*)    All other components within normal limits  COMPREHENSIVE METABOLIC PANEL - Abnormal; Notable for the following:    Chloride 97 (*)    CO2 33 (*)    Glucose, Bld 124 (*)    BUN 32 (*)    Calcium 12.5 (*)    Total Protein 6.3 (*)    Albumin 2.1 (*)    AST 69 (*)    ALT 65 (*)    Alkaline Phosphatase 135 (*)    Total Bilirubin 1.5 (*)     GFR calc non Af Amer 59 (*)    All other components within normal limits  LACTIC ACID, PLASMA - Abnormal; Notable for the following:    Lactic Acid, Venous 2.0 (*)    All other components within normal limits  COMPREHENSIVE METABOLIC PANEL - Abnormal; Notable for the following:    Glucose, Bld 106 (*)    BUN 26 (*)    Calcium 11.8 (*)    Total Protein 6.2 (*)    Albumin 2.0 (*)    AST 50 (*)    ALT 64 (*)    All other components within normal limits  CBC - Abnormal; Notable for the following:    WBC 12.4 (*)    All other components within normal limits  I-STAT CG4 LACTIC ACID, ED - Abnormal; Notable for the following:    Lactic Acid, Venous 2.54 (*)    All other components within normal limits  I-STAT VENOUS BLOOD GAS, ED - Abnormal; Notable for the following:    pH, Ven 7.498 (*)    Bicarbonate 35.5 (*)    Acid-Base Excess 11.0 (*)    All other components within normal limits  I-STAT CG4 LACTIC ACID, ED - Abnormal; Notable for the following:    Lactic Acid, Venous 1.94 (*)    All other components within normal limits  CULTURE, BLOOD (ROUTINE X 2)  CULTURE, BLOOD (ROUTINE X 2)  URINE CULTURE  TROPONIN I  BRAIN NATRIURETIC PEPTIDE  URINALYSIS, ROUTINE W REFLEX MICROSCOPIC  AMMONIA  PROCALCITONIN  BLOOD GAS, VENOUS    EKG  EKG Interpretation None       Radiology Dg Chest Port 1 View  Result Date: 04/02/2017 CLINICAL DATA:  Acute on chronic respiratory failure, with hypoxia. Initial encounter. EXAM: PORTABLE CHEST 1 VIEW COMPARISON:  Chest radiograph performed 04/17/2017 FINDINGS: The lungs are well-aerated. Vascular congestion is noted. Diffuse bilateral airspace opacification raises concern for pulmonary edema, superimposed on chronic lung changes. A small right pleural effusion is noted. No pneumothorax is seen. The cardiomediastinal silhouette is normal in size. No acute osseous abnormalities are seen. IMPRESSION: Vascular congestion. Diffuse bilateral airspace  opacification is  similar to the recent prior study and concerning for pulmonary edema, superimposed on chronic lung changes. Small right pleural effusion noted. Electronically Signed   By: Garald Balding M.D.   On: 04/02/2017 05:37   Dg Chest Portable 1 View  Result Date: 03/29/2017 CLINICAL DATA:  Shortness of breath. EXAM: PORTABLE CHEST 1 VIEW COMPARISON:  CT scan dated 03/30/2017 and chest x-ray dated 03/24/2017 FINDINGS: The patient has bilateral interstitial pulmonary edema, slightly improved since 03/24/2017. There is a loculated pleural effusion at the right lung base which appears slightly less prominent. No discrete left effusion. Heart size and pulmonary vascularity are within normal limits. Emphysematous changes in both upper lobes. IMPRESSION: 1. Interstitial pulmonary edema superimposed on chronic lung disease. 2. Emphysema. 3. Slight improvement in the pulmonary edema since 03/24/2017. Electronically Signed   By: Lorriane Shire M.D.   On: 04/13/2017 12:29    Procedures Procedures (including critical care time)  Medications Ordered in ED Medications  ipratropium-albuterol (DUONEB) 0.5-2.5 (3) MG/3ML nebulizer solution 3 mL (0 mLs Nebulization Hold 03/25/2017 1902)  ampicillin-sulbactam (UNASYN) 1.5 g in sodium chloride 0.9 % 50 mL IVPB (0 g Intravenous Stopped 04/02/17 0657)  0.9 %  sodium chloride infusion (75 mL/hr Intravenous New Bag/Given 03/31/2017 1748)  arformoterol (BROVANA) nebulizer solution 15 mcg (15 mcg Nebulization Not Given 04/02/17 0038)  Glycopyrrolate SOLN 25 mL (25 mLs Inhalation Not Given 04/02/17 0112)  citalopram (CELEXA) tablet 20 mg (20 mg Oral Given 03/27/2017 2000)  enoxaparin (LOVENOX) injection 40 mg (not administered)  sodium chloride flush (NS) 0.9 % injection 3 mL (3 mLs Intravenous Not Given 04/02/2017 2338)  acetaminophen (TYLENOL) tablet 650 mg (not administered)    Or  acetaminophen (TYLENOL) suppository 650 mg (not administered)  ondansetron (ZOFRAN) tablet 4 mg (not  administered)    Or  ondansetron (ZOFRAN) injection 4 mg (not administered)  sodium chloride 0.9 % bolus 500 mL (0 mLs Intravenous Stopped 03/27/2017 1402)  cefTRIAXone (ROCEPHIN) 1 g in dextrose 5 % 50 mL IVPB (0 g Intravenous Stopped 04/09/2017 1739)  azithromycin (ZITHROMAX) 500 mg in dextrose 5 % 250 mL IVPB (0 mg Intravenous Stopped 04/19/2017 1747)     Initial Impression / Assessment and Plan / ED Course  I have reviewed the triage vital signs and the nursing notes.  Pertinent labs & imaging results that were available during my care of the patient were reviewed by me and considered in my medical decision making (see chart for details).     Suspect worsening pleural effusion vs new CHF (less likely) vs atypical pneumonia for cause of patients symptoms and xr/lab findings. Plan for admission fro further management.   Final Clinical Impressions(s) / ED Diagnoses   Final diagnoses:  Hypoxia  Dyspnea, unspecified type  Acute on chronic respiratory failure with hypoxia (HCC)      Breya Cass, Corene Cornea, MD 04/02/17 7824

## 2017-04-01 NOTE — ED Notes (Signed)
RN entered room pt SpO2 78%/RA venturi mask laying near patient- RN assisted in placing NRB on patient and encouraged good deep breaths. Patient able to follow commands but pullings at wires and IVs. RN verbally re-oriented patient and SpO2 increased to 96%- patient placed on Venturi 12L. And IVs wrapped in gauze.

## 2017-04-01 NOTE — ED Triage Notes (Signed)
Per [facility]. Patient coming from [facility] family physician complaining of SOB and hypoxia. Oxygen saturation 86% on 6L of oxygen. 95% with nonrebreather at 15L. Breath sounds diminished. Patient recently had a pleural effusion last week. Doctors office believe that is what is going on today. Patient reports a nonproductive cough for 2 weeks. Hx of COPD. Patient alert and oriented to only name and place. Disoriented to time and situation.

## 2017-04-01 NOTE — H&P (Signed)
History and Physical    Stephen Dean SPQ:[id number] DOB: 04/25/1929 DOA: 04/24/2017   PCP: Seward Carol, MD   Attending physician: Marily Memos  Patient coming from/Resides with: Private residence/wife  Chief Complaint: Progressive shortness of breath  HPI: Stephen Dean is a 83 y.o. male with medical history significant for CAD with history of remote PTCA to RCA and LAD, PVD with right AKA, hypertension, HLD, COPD with chronic hypoxemia on 4 L oxygen at home. This patient has had several weeks of worsening hypoxemia with congestion and nonproductive cough. He has not had any fevers or chills. He has not had any lower extremity edema. His PCP has treated him with 2 courses of antibiotics  (? Avelox and other unknown medication ) without improvement in symptoms. Imaging in July revealed bilateral pleural effusions right greater than left. His pulmonologist subsequent ordered outpatient thoracentesis that was accomplished on 7/26 with 1.7 L of fluid removed with cytology subsequently consistent with exudative effusion and atypical cells. Unfortunately within 24 hours of the thoracentesis patient felt generalized weakness and hypotension and was taken to the ER where he was given 500 mL of fluid. Since that time he has undergone a CT of the chest without contrast on 8/6 this revealed bilateral pleural effusions with groundglass attenuation and interstitial thickening compatible with moderate congestive heart failure. He returns to the ER today with worsening hypoxemia with increased O2 requirements. Was sent over from the PCPs office noting that his O2 saturation was 86% on 6 L oxygen and improved to 95% on a nonrebreather 15 L. PCXR in the ER consistent with interstitial pulmonary edema superimposed on chronic lung disease with emphysema. He currently appears comfortable on a Venturi mask at 50% with sats between 90-94%. Of note the pulmonologist obtained history that patient has been choking and  coughing with eating and personally reviewed the recent CT scans which demonstrated food debris in the esophagus extending the entire length of the esophagus towards the upper airways.  ED Course:  Vital Signs: BP (!) 74/49   Pulse 75   Temp 98 F (36.7 C) (Axillary)   Resp (!) 22   Ht 5' 7"  (1.702 m)   Wt 95.3 kg (210 lb)   SpO2 92%   BMI 32.89 kg/m  PCXR: As above Lab data: Sodium 139, potassium 4.6, chloride 97, CO2 33, glucose 124, BUN 32, creatinine 1.10, calcium 4.5, anion gap 9, alkaline phosphatase 135, albumin 2.1, AST 69, ALT 65, total bilirubin 1.5, BNP 42.5, troponin <0.03, ammonia 17, lactic acid 2.54, white count 15,300 with neutrophils 84% and absolute neutrophils 12.9%, hemoglobin 14.4, platelets 320,000; blood cultures obtained in the ER Medications and treatments: DuoNeb 1, normal saline bolus 500 mL, Rocephin 1 g IV 1, Zithromax 500 mg IV 1  Review of Systems:  In addition to the HPI above,  No Fever-chills, myalgias or other constitutional symptoms No Headache, changes with Vision or hearing, new weakness, tingling, numbness in any extremity, dizziness, dysarthria or word finding difficulty, gait disturbance or imbalance, tremors or seizure activity No indigestion/reflux, abdominal pain with or after eating No Chest pain, palpitations, ? orthopnea  No Abdominal pain, N/V, melena,hematochezia, dark tarry stools, constipation No dysuria, malodorous urine, hematuria or flank pain No new skin rashes, lesions, masses or bruises, No new joint pains, aches, swelling or redness No recent unintentional weight gain or loss No polyuria, polydypsia or polyphagia   Past Medical History:  Diagnosis Date  . Arthritis   . COPD (chronic obstructive pulmonary  disease) ([place])   . Coronary artery disease   . Depression   . Dyspnea   . GERD (gastroesophageal reflux disease)   . Heart disease   . Hyperlipidemia   . Hypertension   . Infection of kidney    kidney infections    . Myocardial infarction ([employer])   . Peripheral vascular disease ([place])   . Skin cancer     Past Surgical History:  Procedure Laterality Date  . AMPUTATION  06/18/2012   Procedure: AMPUTATION ABOVE KNEE;  Surgeon: Mal Misty, MD;  Location: [place];  Service: Vascular;  Laterality: Right;  . CARDIAC SURGERY     Stent placement x 2  . COLONOSCOPY    . EMBOLECTOMY  06/17/2012   Procedure: EMBOLECTOMY;  Surgeon: Conrad [place], MD;  Location: [facility];  Service: Vascular;  Laterality: Right;  Thrombectomy of right tibial arteries,exclusion of right popliteal aneurysm, femoral -popliteal artery bypass graft,four compartment fasciotomies  . EXCISION NASAL MASS Left 06/18/2016   Procedure: EXCISION NASAL MASS;  Surgeon: Izora Gala, MD;  Location: [place];  Service: ENT;  Laterality: Left;  frozen section evaluation, possible skin graft, possible nasal labial flap.  Marland Kitchen EYE SURGERY    . FASCIOTOMY  06/17/2012   Procedure: FASCIOTOMY;  Surgeon: Conrad Floyd, MD;  Location: [place];  Service: Vascular;  Laterality: Right;  four compartment  fasciotomy  . FEMORAL-POPLITEAL BYPASS GRAFT  06/17/2012   Procedure: BYPASS GRAFT FEMORAL-POPLITEAL ARTERY;  Surgeon: Conrad [place], MD;  Location: [facility];  Service: Vascular;  Laterality: Right;  Using 6 mm x 80 cm propaten goretex graft  . HERNIA REPAIR    . INTRAOPERATIVE ARTERIOGRAM  06/17/2012   Procedure: INTRA OPERATIVE ARTERIOGRAM;  Surgeon: Conrad Brenas, MD;  Location: [place];  Service: Vascular;  Laterality: Right;    Social History   Social History  . Marital status: Married    Spouse name: N/A  . Number of children: N/A  . Years of education: N/A   Occupational History  . Not on file.   Social History Main Topics  . Smoking status: Former Smoker    Packs/day: 3.00    Years: 60.00    Types: Cigarettes    Quit date: 06/09/2012  . Smokeless tobacco: Never Used  . Alcohol use No  . Drug use: No  . Sexual activity: Not on file   Other Topics  Concern  . Not on file   Social History Narrative   Lives with with   Retired - worked with a [employer]   2 children    Mobility: Gay Work history: Not obtained   Allergies  Allergen Reactions  . Lisinopril Cough    Family History  Problem Relation Age of Onset  . Kidney disease Mother     Prior to Admission medications   Medication Sig Start Date End Date Taking? Authorizing Provider  acetaminophen (TYLENOL) 500 MG tablet Take 1,000 mg by mouth every 6 (six) hours as needed for mild pain.    [provider]  arformoterol (BROVANA) 15 MCG/2ML NEBU Take 2 mLs (15 mcg total) by nebulization 2 (two) times daily. 03/24/17   Mannam, Hart Robinsons, MD  aspirin 81 MG tablet Take 81 mg by mouth daily.    [provider]  atenolol (TENORMIN) 50 MG tablet TAKE 1 TABLET (50 MG TOTAL) BY MOUTH DAILY. 03/02/17   Troy Sine, MD  cetirizine (ZYRTEC) 10 MG tablet Take 10 mg by mouth daily.  [provider]  citalopram (CELEXA) 20 MG tablet Take 20 mg by mouth daily. 09/13/15   [provider]  dextromethorphan-guaiFENesin (MUCINEX DM) 30-600 MG 12hr tablet Take 1 tablet by mouth 2 (two) times daily as needed for cough.    [provider]  furosemide (LASIX) 40 MG tablet Take 1 tablet (40 mg total) by mouth daily. 03/26/17   Mannam, Hart Robinsons, MD  Glycopyrrolate (LONHALA MAGNAIR STARTER KIT) 25 MCG/ML SOLN Inhale 25 mLs into the lungs 2 (two) times daily. Dx code: j43.9 03/24/17   Marshell Garfinkel, MD  HYDROcodone-acetaminophen (NORCO) 7.5-325 MG tablet Take 1 tablet by mouth every 6 (six) hours as needed for moderate pain. 06/18/16   Izora Gala, MD  losartan (COZAAR) 25 MG tablet Take 25 mg by mouth daily. 09/21/14   [provider]  Omega-3 Fatty Acids (FISH OIL) 1200 MG CAPS Take 1,200 mg by mouth daily.    [provider]  pravastatin (PRAVACHOL) 80 MG tablet TAKE 1 TABLET BY MOUTH DAILY. 03/11/17   Troy Sine, MD  STIOLTO  RESPIMAT 2.5-2.5 MCG/ACT AERS INHALE 2 PUFFS INTO THE LUNGS DAILY. 10/21/16   Mannam, Hart Robinsons, MD  tamsulosin (FLOMAX) 0.4 MG CAPS capsule Take 0.8 mg by mouth at bedtime.  09/16/15   [provider]  vitamin C (ASCORBIC ACID) 500 MG tablet Take 500 mg by mouth daily.     [provider]  vitamin E 400 UNIT capsule Take 400 Units by mouth daily.    [provider]    Physical Exam: Vitals:   04/13/2017 1400 03/28/2017 1424 03/31/2017 1503 03/26/2017 1605  BP: (!) 93/49 (!) 91/46 (!) 74/49 (!) 122/52  Pulse: (!) 51 73 75 82  Resp: (!) 21 20 (!) 22 (!) 29  Temp:      TempSrc:      SpO2: (!) 89% 94% 92% 93%  Weight:      Height:          Constitutional: NAD, calm, comfortable-Appears chronically ill Eyes: PERRL, lids and conjunctivae normal ENMT: Mucous membranes are dry. Posterior pharynx clear of any exudate or lesions. Age-appropriate dentition.  Neck: normal, supple, no masses, no thyromegaly Respiratory: Diffuse fine expiratory crackles posteriorly, Normal respiratory effort. No accessory muscle use. 50% Ventimask Cardiovascular: Regular rate and rhythm, no murmurs / rubs / gallops. No extremity edema. 2+ pedal pulses. No carotid bruits.  Abdomen: no tenderness, no masses palpated. No hepatosplenomegaly. Bowel sounds positive.  Musculoskeletal: no clubbing / cyanosis. No joint deformity upper and lower extremities. Good ROM, no contractures. Normal muscle tone.  Skin: no rashes, lesions, ulcers. No induration Neurologic: CN 2-12 grossly intact. Sensation intact, DTR normal. Strength 4/5 x all 4 extremities.  Psychiatric: Alert and oriented x name. Flat mood.    Labs on Admission: I have personally reviewed following labs and imaging studies  CBC:  Recent Labs Lab 03/30/2017 1215  WBC 15.3*  NEUTROABS 12.9*  HGB 14.4  HCT 43.0  MCV 93.9  PLT 106   Basic Metabolic Panel:  Recent Labs Lab 04/16/2017 1215  NA 139  K 4.6  CL 97*  CO2 33*  GLUCOSE  124*  BUN 32*  CREATININE 1.10  CALCIUM 12.5*   GFR: Estimated Creatinine Clearance: 54 mL/min (by C-G formula based on SCr of 1.1 mg/dL). Liver Function Tests:  Recent Labs Lab 04/02/2017 1215  AST 69*  ALT 65*  ALKPHOS 135*  BILITOT 1.5*  PROT 6.3*  ALBUMIN 2.1*   No results for input(s):  LIPASE, AMYLASE in the last 168 hours.  Recent Labs Lab 04/09/2017 1320  AMMONIA 17   Coagulation Profile: No results for input(s): INR, PROTIME in the last 168 hours. Cardiac Enzymes:  Recent Labs Lab 04/11/2017 1215  TROPONINI <0.03   BNP (last 3 results) No results for input(s): PROBNP in the last 8760 hours. HbA1C: No results for input(s): HGBA1C in the last 72 hours. CBG: No results for input(s): GLUCAP in the last 168 hours. Lipid Profile: No results for input(s): CHOL, HDL, LDLCALC, TRIG, CHOLHDL, LDLDIRECT in the last 72 hours. Thyroid Function Tests: No results for input(s): TSH, T4TOTAL, FREET4, T3FREE, THYROIDAB in the last 72 hours. Anemia Panel: No results for input(s): VITAMINB12, FOLATE, FERRITIN, TIBC, IRON, RETICCTPCT in the last 72 hours. Urine analysis:    Component Value Date/Time   COLORURINE AMBER (A) 03/19/2017 2026   APPEARANCEUR HAZY (A) 03/19/2017 2026   LABSPEC 1.027 03/19/2017 2026   PHURINE 5.0 03/19/2017 2026   GLUCOSEU NEGATIVE 03/19/2017 2026   HGBUR NEGATIVE 03/19/2017 2026   BILIRUBINUR NEGATIVE 03/19/2017 2026   KETONESUR NEGATIVE 03/19/2017 2026   PROTEINUR NEGATIVE 03/19/2017 2026   NITRITE NEGATIVE 03/19/2017 2026   LEUKOCYTESUR SMALL (A) 03/19/2017 2026   Sepsis Labs: @LABRCNTIP (procalcitonin:4,lacticidven:4) )No results found for this or any previous visit (from the past 240 hour(s)).   Radiological Exams on Admission: Dg Chest Portable 1 View  Result Date: 04/23/2017 CLINICAL DATA:  Shortness of breath. EXAM: PORTABLE CHEST 1 VIEW COMPARISON:  CT scan dated 03/30/2017 and chest x-ray dated 03/24/2017 FINDINGS: The patient has  bilateral interstitial pulmonary edema, slightly improved since 03/24/2017. There is a loculated pleural effusion at the right lung base which appears slightly less prominent. No discrete left effusion. Heart size and pulmonary vascularity are within normal limits. Emphysematous changes in both upper lobes. IMPRESSION: 1. Interstitial pulmonary edema superimposed on chronic lung disease. 2. Emphysema. 3. Slight improvement in the pulmonary edema since 03/24/2017. Electronically Signed   By: Lorriane Shire M.D.   On: 04/21/2017 12:29    EKG: (Independently reviewed) Sinus rhythm with ventricular rate 67 bpm, right bundle branch block, QTC 450 ms, no definitive acute ischemic changes  Assessment/Plan Principal Problem:   Acute on chronic respiratory failure with hypoxia 2/2 COPD and acute aspiration pneumonitis -Patient presents with recurrent acute on chronic hypoxemic respiratory failure that has not responded to outpatient antibiotics -Pulmonologist has reviewed outpatient CT scan and has noted that patient has significant food debris in the esophagus; upon further questioning of patient and family it was determined he has been coughing and choking with eating and findings are consistent with severe end-stage COPD with recurrent severe pneumonitis in setting of recurrent aspiration episodes -IV Unasyn -NPO except for meds with SLP evaluation for diet appropriateness -Procalcitonin -Pulmonary medicine recommends palliative care evaluation for goals of care-I have discussed this with the wife and she is in agreement -Continue supportive care with oxygen -Not wheezing but may benefit from a short course of steroids to help with inflammatory process -Stiolto MDI recently as continued in favor of Brovana and Ionhala nebs as of 8/6 by pulmonologist at home  Active Problems:   Pleural effusion transudative -Likely secondary to inflammatory process in setting of recurrent aspiration pneumonitis -Given  severe COPD obtain echocardiogram since has not been completed since 2012-likely will give [place] more information regarding severity of lung disease regarding cor pulmonale/possible pulmonary hypertension -Previous echo within normal limits and BNP normal making typical heart failure less likely  Hypertension -Current blood pressure suboptimal secondary to home medications as well as poor oral intake and dehydration -Hold preadmission Tenormin, Cozaar as well as his Lasix    Hypercalcemia/FTT -Seems to be reflective of hemoconcentration from dehydration and poor oral intake -Gentle IV fluid hydration -Lactic acid also mildly elevated and will trend    Transaminitis -Mild and likely related to hypoxemia as well as poor nutrition -Follow labs    Hyperlipidemia -Hold Pravachol in setting of mild transaminitis  -Can resume omega-3 fatty acids once swallow evaluation completed    Coronary artery disease -Currently asymptomatic -None candidate for aggressive evaluation or interventions    BPH -Hold Flomax in setting of suboptimal blood pressure readings      DVT prophylaxis: Lovenox  Code Status: DO NOT RESUSCITATE  Family Communication: Daughter and wife at bedside Disposition Plan: Home Consults called: Pulmonology/[place]    ELLIS,ALLISON L. ANP-BC [facility] Pager [phone]   If 7PM-7AM, please contact night-coverage [email or web address] Password [facility]  04/10/2017, 5:23 PM

## 2017-04-01 NOTE — Consult Note (Signed)
PULMONARY / CRITICAL CARE MEDICINE   Name: Stephen Dean MRN: [id number] DOB: September 07, 1928    ADMISSION DATE:  04/10/2017 CONSULTATION DATE:  04/13/2017  REFERRING MD:  Marily Memos  CHIEF COMPLAINT:  Dyspnea  HISTORY OF PRESENT ILLNESS:   83 y/o male with presumable dementia (non-verbal, bed bound recently), who chokes on food and has emphysema was admitted from the [facility] ER on 8/8 with dyspnea.  He was evaluated by Dr. Concepcion Living for COPD earlier in 2018 for his COPD and in July 2018 he had a thoracentesis which drew off about 1.6L from the right lung, lymphocyte predominant fluid.  He presented to his PCP today with worsening hypoxemia and cyanosis.  His family notes that he frequently chokes on food.  They deny fever, chills, or worsening cough lately.  PCCM was consulted for acute on chronic respiratory failure.    PAST MEDICAL HISTORY :  He  has a past medical history of Arthritis; COPD (chronic obstructive pulmonary disease) ([place]); Coronary artery disease; Depression; Dyspnea; GERD (gastroesophageal reflux disease); Heart disease; Hyperlipidemia; Hypertension; Infection of kidney; Myocardial infarction ([place]); Peripheral vascular disease ([place]); and Skin cancer.  PAST SURGICAL HISTORY: He  has a past surgical history that includes Hernia repair; Cardiac surgery; Embolectomy (06/17/2012); Femoral-popliteal Bypass Graft (06/17/2012); Intraoperative arteriogram (06/17/2012); Fasciotomy (06/17/2012); Amputation (06/18/2012); Eye surgery; Colonoscopy; and Excision nasal mass (Left, 06/18/2016).  Allergies  Allergen Reactions  . Lisinopril Cough    No current facility-administered medications on file prior to encounter.    Current Outpatient Prescriptions on File Prior to Encounter  Medication Sig  . acetaminophen (TYLENOL) 500 MG tablet Take 500-1,000 mg by mouth every 6 (six) hours as needed for mild pain or headache.   Marland Kitchen aspirin 81 MG tablet Take 81 mg by mouth daily.  Marland Kitchen atenolol  (TENORMIN) 50 MG tablet TAKE 1 TABLET (50 MG TOTAL) BY MOUTH DAILY. (Patient taking differently: Take 25 mg by mouth daily. )  . cetirizine (ZYRTEC) 10 MG tablet Take 10 mg by mouth daily.  . citalopram (CELEXA) 20 MG tablet Take 20 mg by mouth daily.  Marland Kitchen dextromethorphan-guaiFENesin (MUCINEX DM) 30-600 MG 12hr tablet Take 1 tablet by mouth 2 (two) times daily as needed for cough.  . furosemide (LASIX) 40 MG tablet Take 1 tablet (40 mg total) by mouth daily. (Patient taking differently: Take 40 mg by mouth 2 (two) times daily. )  . losartan (COZAAR) 25 MG tablet Take 25 mg by mouth daily.  . Omega-3 Fatty Acids (FISH OIL) 1200 MG CAPS Take 1,200 mg by mouth daily.  . pravastatin (PRAVACHOL) 80 MG tablet TAKE 1 TABLET BY MOUTH DAILY. (Patient taking differently: Take 80 mg by mouth once a day)  . STIOLTO RESPIMAT 2.5-2.5 MCG/ACT AERS INHALE 2 PUFFS INTO THE LUNGS DAILY.  . tamsulosin (FLOMAX) 0.4 MG CAPS capsule Take 0.8 mg by mouth at bedtime.   . vitamin C (ASCORBIC ACID) 500 MG tablet Take 500 mg by mouth daily.   . vitamin E 400 UNIT capsule Take 400 Units by mouth daily.  Marland Kitchen arformoterol (BROVANA) 15 MCG/2ML NEBU Take 2 mLs (15 mcg total) by nebulization 2 (two) times daily.  . Glycopyrrolate (LONHALA MAGNAIR STARTER KIT) 25 MCG/ML SOLN Inhale 25 mLs into the lungs 2 (two) times daily. Dx code: j43.9  . HYDROcodone-acetaminophen (NORCO) 7.5-325 MG tablet Take 1 tablet by mouth every 6 (six) hours as needed for moderate pain. (Patient not taking: Reported on 04/08/2017)    FAMILY HISTORY:  His indicated that  his mother is deceased. He indicated that his father is deceased.    SOCIAL HISTORY: He  reports that he quit smoking about 4 years ago. His smoking use included Cigarettes. He has a 180.00 pack-year smoking history. He has never used smokeless tobacco. He reports that he does not drink alcohol or use drugs.  REVIEW OF SYSTEMS:   Cannot obtain because he is non-verbal  SUBJECTIVE:  As  above  VITAL SIGNS: BP (!) 122/52   Pulse 82   Temp 98 F (36.7 C) (Axillary)   Resp (!) 29   Ht 5' 7"  (1.702 m)   Wt 95.3 kg (210 lb)   SpO2 93%   BMI 32.89 kg/m   HEMODYNAMICS:    VENTILATOR SETTINGS: FiO2 (%):  [50 %] 50 %  INTAKE / OUTPUT: No intake/output data recorded.  PHYSICAL EXAMINATION:  Gen: chronically ill appearing, mild respiratory distress HENT: NCAT, OP clear, neck supple without masses Eyes: PERRL, EOMi Lymph: no cervical lymphadenopathy PULM: diminished bases, rhonchi bialterally CV: RRR, no mgr, no JVD GI: BS+, soft, nontender, no hsm Derm: dry skin, edema left leg MSK: s/p AKA Neuro: non-verbal for me, not following commands but awake Psyche: normal mood and affect   LABS:  BMET  Recent Labs Lab 04/24/2017 1215  NA 139  K 4.6  CL 97*  CO2 33*  BUN 32*  CREATININE 1.10  GLUCOSE 124*    Electrolytes  Recent Labs Lab 04/08/2017 1215  CALCIUM 12.5*    CBC  Recent Labs Lab 04/11/2017 1215  WBC 15.3*  HGB 14.4  HCT 43.0  PLT 320    Coag's No results for input(s): APTT, INR in the last 168 hours.  Sepsis Markers  Recent Labs Lab 04/02/2017 1225  LATICACIDVEN 2.54*    ABG No results for input(s): PHART, PCO2ART, PO2ART in the last 168 hours.  Liver Enzymes  Recent Labs Lab 04/13/2017 1215  AST 69*  ALT 65*  ALKPHOS 135*  BILITOT 1.5*  ALBUMIN 2.1*    Cardiac Enzymes  Recent Labs Lab 03/27/2017 1215  TROPONINI <0.03    Glucose No results for input(s): GLUCAP in the last 168 hours.  Imaging Dg Chest Portable 1 View  Result Date: 03/31/2017 CLINICAL DATA:  Shortness of breath. EXAM: PORTABLE CHEST 1 VIEW COMPARISON:  CT scan dated 03/30/2017 and chest x-ray dated 03/24/2017 FINDINGS: The patient has bilateral interstitial pulmonary edema, slightly improved since 03/24/2017. There is a loculated pleural effusion at the right lung base which appears slightly less prominent. No discrete left effusion. Heart  size and pulmonary vascularity are within normal limits. Emphysematous changes in both upper lobes. IMPRESSION: 1. Interstitial pulmonary edema superimposed on chronic lung disease. 2. Emphysema. 3. Slight improvement in the pulmonary edema since 03/24/2017. Electronically Signed   By: Lorriane Shire M.D.   On: 04/08/2017 12:29     STUDIES:  8/6 CT chest images reviewed: severe upper lobe predominant emphysema with lower lobe airspace disease; there is significant bilateral lower lobe pleural thickening with trace effusions R>L; patulous esophagus with food in it.  CULTURES: 03/31/2017 blood>  04/15/2017 urine>   ANTIBIOTICS: Unasyn 8/8 >  Azithro 8/8 >   SIGNIFICANT EVENTS:   LINES/TUBES:   DISCUSSION: 83 y/o male who is non-verbal who has severe emphysema and lower lobe airspace disease with bilateral effusions.  The most likely scenario here is that this gentleman aspirates (he frequently chokes on food) and has recurrent aspiration pneumonia.  I'm not entirely clear why the pleural  fluid is lymphocyte predominant, so I suppose malignancy is also possible.  He is too sick to undergo a bronchoscopy to evaluate further right now.  DNR status noted, agree.  Would treat for aspiration pneumonia.  ASSESSMENT / PLAN:  PULMONARY A: Severe acute respiratory failure with hypoxemia Severe COPD with centrilobular emphysema Aspiration pneumonia Bilateral pleural effusions, transudative, probably related to recurrent aspiration but cannot rule out malignancy Aspiration P:   Speech evaluation Titrate O2 to O2 saturation 90-94% Agree with current antibiotics No role for repeat thoracentesis Needs goals of care conversation > consider palliative care Continue duoneb q4h as you are doing Repeat CXR tomorrow  Roselie Awkward, MD [facility] Pager: [phone] Cell: [phone] After 3pm or if no response, call [phone]  03/29/2017, 5:14 PM

## 2017-04-02 ENCOUNTER — Telehealth: Payer: Self-pay | Admitting: *Deleted

## 2017-04-02 ENCOUNTER — Inpatient Hospital Stay (HOSPITAL_COMMUNITY): Payer: Medicare HMO

## 2017-04-02 ENCOUNTER — Other Ambulatory Visit (HOSPITAL_COMMUNITY): Payer: Medicare HMO

## 2017-04-02 DIAGNOSIS — Z7189 Other specified counseling: Secondary | ICD-10-CM

## 2017-04-02 DIAGNOSIS — N400 Enlarged prostate without lower urinary tract symptoms: Secondary | ICD-10-CM

## 2017-04-02 DIAGNOSIS — F0390 Unspecified dementia without behavioral disturbance: Secondary | ICD-10-CM

## 2017-04-02 DIAGNOSIS — F329 Major depressive disorder, single episode, unspecified: Secondary | ICD-10-CM

## 2017-04-02 DIAGNOSIS — Z515 Encounter for palliative care: Secondary | ICD-10-CM

## 2017-04-02 DIAGNOSIS — R0600 Dyspnea, unspecified: Secondary | ICD-10-CM

## 2017-04-02 DIAGNOSIS — I10 Essential (primary) hypertension: Secondary | ICD-10-CM

## 2017-04-02 LAB — COMPREHENSIVE METABOLIC PANEL
ALK PHOS: 124 U/L (ref 38–126)
ALT: 64 U/L — AB (ref 17–63)
AST: 50 U/L — ABNORMAL HIGH (ref 15–41)
Albumin: 2 g/dL — ABNORMAL LOW (ref 3.5–5.0)
Anion gap: 11 (ref 5–15)
BILIRUBIN TOTAL: 0.9 mg/dL (ref 0.3–1.2)
BUN: 26 mg/dL — ABNORMAL HIGH (ref 6–20)
CALCIUM: 11.8 mg/dL — AB (ref 8.9–10.3)
CO2: 28 mmol/L (ref 22–32)
CREATININE: 0.8 mg/dL (ref 0.61–1.24)
Chloride: 102 mmol/L (ref 101–111)
GFR calc non Af Amer: >60 mL/min (ref >60)
GLUCOSE: 106 mg/dL — AB (ref 65–99)
Potassium: 3.6 mmol/L (ref 3.5–5.1)
SODIUM: 141 mmol/L (ref 135–145)
TOTAL PROTEIN: 6.2 g/dL — AB (ref 6.5–8.1)

## 2017-04-02 LAB — CBC
HCT: 40.3 % (ref 39.0–52.0)
Hemoglobin: 13 g/dL (ref 13.0–17.0)
MCH: 30.6 pg (ref 26.0–34.0)
MCHC: 32.3 g/dL (ref 30.0–36.0)
MCV: 94.8 fL (ref 78.0–100.0)
PLATELETS: 249 10*3/uL (ref 150–400)
RBC: 4.25 MIL/uL (ref 4.22–5.81)
RDW: 14 % (ref 11.5–15.5)
WBC: 12.4 10*3/uL — ABNORMAL HIGH (ref 4.0–10.5)

## 2017-04-02 LAB — MRSA PCR SCREENING: MRSA by PCR: POSITIVE — AB

## 2017-04-02 MED ORDER — TAMSULOSIN HCL 0.4 MG PO CAPS
0.8000 mg | ORAL_CAPSULE | Freq: Every day | ORAL | Status: DC
  Administered 2017-04-02 – 2017-04-05 (×4): 0.8 mg via ORAL
  Filled 2017-04-02 (×4): qty 2

## 2017-04-02 MED ORDER — BISOPROLOL FUMARATE 5 MG PO TABS
10.0000 mg | ORAL_TABLET | Freq: Every day | ORAL | Status: DC
  Administered 2017-04-02 – 2017-04-05 (×4): 10 mg via ORAL
  Filled 2017-04-02 (×4): qty 2

## 2017-04-02 MED ORDER — CHLORHEXIDINE GLUCONATE CLOTH 2 % EX PADS
6.0000 | MEDICATED_PAD | Freq: Every day | CUTANEOUS | Status: AC
  Administered 2017-04-03 – 2017-04-06 (×4): 6 via TOPICAL

## 2017-04-02 MED ORDER — PERFLUTREN LIPID MICROSPHERE
INTRAVENOUS | Status: AC
  Administered 2017-04-02: 3 mL via INTRAVENOUS
  Filled 2017-04-02: qty 10

## 2017-04-02 MED ORDER — METHYLPREDNISOLONE SODIUM SUCC 40 MG IJ SOLR
40.0000 mg | Freq: Two times a day (BID) | INTRAMUSCULAR | Status: DC
  Administered 2017-04-02 – 2017-04-05 (×6): 40 mg via INTRAVENOUS
  Filled 2017-04-02 (×6): qty 1

## 2017-04-02 MED ORDER — MUPIROCIN 2 % EX OINT
1.0000 "application " | TOPICAL_OINTMENT | Freq: Two times a day (BID) | CUTANEOUS | Status: DC
  Administered 2017-04-02 – 2017-04-06 (×9): 1 via NASAL
  Filled 2017-04-02: qty 22

## 2017-04-02 MED ORDER — SODIUM CHLORIDE 0.9 % IV BOLUS (SEPSIS)
500.0000 mL | Freq: Once | INTRAVENOUS | Status: AC
  Administered 2017-04-02: 500 mL via INTRAVENOUS

## 2017-04-02 MED ORDER — PERFLUTREN LIPID MICROSPHERE
1.0000 mL | INTRAVENOUS | Status: AC | PRN
  Filled 2017-04-02: qty 10

## 2017-04-02 MED ORDER — PNEUMOCOCCAL VAC POLYVALENT 25 MCG/0.5ML IJ INJ
0.5000 mL | INJECTION | INTRAMUSCULAR | Status: AC
  Administered 2017-04-03: 0.5 mL via INTRAMUSCULAR
  Filled 2017-04-02: qty 0.5

## 2017-04-02 MED ORDER — RESOURCE THICKENUP CLEAR PO POWD
ORAL | Status: DC | PRN
  Filled 2017-04-02: qty 125

## 2017-04-02 MED ORDER — MUPIROCIN 2 % EX OINT
TOPICAL_OINTMENT | CUTANEOUS | Status: AC
  Filled 2017-04-02: qty 22

## 2017-04-02 MED ORDER — METOPROLOL TARTRATE 5 MG/5ML IV SOLN: 5.0000 mg | Freq: Once | INTRAVENOUS | Status: AC

## 2017-04-02 MED ORDER — DM-GUAIFENESIN ER 30-600 MG PO TB12
1.0000 | ORAL_TABLET | Freq: Two times a day (BID) | ORAL | Status: DC | PRN
  Administered 2017-04-03: 1 via ORAL
  Filled 2017-04-02: qty 1

## 2017-04-02 MED ORDER — METOPROLOL TARTRATE 5 MG/5ML IV SOLN
INTRAVENOUS | Status: AC
  Administered 2017-04-02: 5 mg
  Filled 2017-04-02: qty 5

## 2017-04-02 NOTE — Telephone Encounter (Signed)
Can we appeal. Thanks  Marshell Garfinkel MD [facility] Pager [phone] If no answer or after 3pm call: [phone] 04/02/2017, 3:31 PM

## 2017-04-02 NOTE — Progress Notes (Signed)
Attempted echocardiogram on patient on 04/02/2017. Patient HR in 130s entire exam. Unable to complete echo. Will come back.

## 2017-04-02 NOTE — Consult Note (Addendum)
Consultation Note Date: 04/02/2017   Patient Name: Stephen Dean  DOB: 1928-11-09  MRN: [id number]  Age / Sex: 83 y.o., male  PCP: Seward Carol, MD Referring Physician: Waldemar Dickens, MD  Reason for Consultation: Establishing goals of care  HPI/Patient Profile: 83 y.o. male  with past medical history of questionable dementia, history of chronic obstructive pulmonary disease-severe emphysema, underlying past medical history significant for gastroesophageal reflux disease, dyslipidemia, hypertension, myocardial infarction, peripheral vascular disease  admitted on 04/14/2017 with severe acute hypoxic respiratory failure in the setting of possible aspiration pneumonia, in the setting of severe COPD with centrilobular emphysema, recent bilateral pleural effusions transudative probably related to recurrent aspiration versus malignancy  .   Patient is currently nothing by mouth, on antibiotics. Palliative consultation for goals of care discussions.  Clinical Assessment and Goals of Care:  Elderly gentleman resting in bed. He is currently getting a breathing treatment. There is no family present at the bedside. Patient is more awake more alert more responsive than has been previously noted. Patient is able to answer questions asked appropriately. Patient states he feels hungry. He denies any dyspnea while resting. He denies any chest pain.   I introduced myself and palliative care as follows: Palliative medicine is specialized medical care for people living with serious illness. It focuses on providing relief from the symptoms and stress of a serious illness. The goal is to improve quality of life for both the patient and the family.  Today's chest x-ray noted, currently nothing by mouth, plans for speech in which pathology evaluation. Await further pulmonary input. Patient will need goals of care conversations and  discussions with patient and family about severe COPD disease trajectory, coronal for appropriate symptom management, additional disposition planning. Will further need to look into questionable dementia history. Will need additional discussions with family depending on modified barium swallow results about the role of artificial nutrition/hydration in the setting of possible dementia, severe COPD.  See recommendations below. Thank you for the consult.  NEXT OF KIN  Wife, children  SUMMARY OF RECOMMENDATIONS    Agree with DO NOT RESUSCITATE.  Await additional pulmonary medicine recommendations: Patient with severe emphysema, bilateral pleural effusions deemed to secondary to recurrent aspiration versus questionable underlying malignancy based on recent transudative pleural effusion studies.  Await speech-language pathology evaluation-will probably undergo modified barium swallow study.  Will follow-up with wife/children for setting up a family meeting to discuss additional goals of care, disposition planning based on hospital course. We will continue to follow along and help guide appropriate decision-making. Thank you for the consult.  Code Status/Advance Care Planning:  DNR    Symptom Management:    As above  Palliative Prophylaxis:   Bowel Regimen     Psycho-social/Spiritual:   Desire for further Chaplaincy support:no  Additional Recommendations: Caregiving  Support/Resources  Prognosis:   Unable to determine  Discharge Planning: To Be Determined      Primary Diagnoses: Present on Admission: . Acute on chronic respiratory failure with hypoxia ([place]) . Hyperlipidemia .  Pleural effusion transudative . Hypertension . Coronary artery disease . Hypercalcemia . Transaminitis . Aspiration pneumonia ([place])   I have reviewed the medical record, interviewed the patient and family, and examined the patient. The following aspects are pertinent.  Past Medical History:    Diagnosis Date  . Arthritis   . COPD (chronic obstructive pulmonary disease) ([place])   . Coronary artery disease   . Depression   . Dyspnea   . GERD (gastroesophageal reflux disease)   . Heart disease   . Hyperlipidemia   . Hypertension   . Infection of kidney    kidney infections  . Myocardial infarction ([place])   . Peripheral vascular disease ([place])   . Skin cancer    Social History   Social History  . Marital status: Married    Spouse name: N/A  . Number of children: N/A  . Years of education: N/A   Social History Main Topics  . Smoking status: Former Smoker    Packs/day: 3.00    Years: 60.00    Types: Cigarettes    Quit date: 06/09/2012  . Smokeless tobacco: Never Used  . Alcohol use No  . Drug use: No  . Sexual activity: Not Asked   Other Topics Concern  . None   Social History Narrative   Lives with with   Retired - worked with a [employer] company   2 children   Family History  Problem Relation Age of Onset  . Kidney disease Mother    Scheduled Meds: . arformoterol  15 mcg Nebulization BID  . citalopram  20 mg Oral Daily  . enoxaparin (LOVENOX) injection  40 mg Subcutaneous Daily  . Glycopyrrolate  25 mL Inhalation BID  . ipratropium-albuterol  3 mL Nebulization Q4H  . sodium chloride flush  3 mL Intravenous Q12H   Continuous Infusions: . sodium chloride 75 mL/hr (03/31/2017 1748)  . ampicillin-sulbactam (UNASYN) IV Stopped (04/02/17 0657)   PRN Meds:.acetaminophen **OR** acetaminophen, ondansetron **OR** ondansetron (ZOFRAN) IV Medications Prior to Admission:  Prior to Admission medications   Medication Sig Start Date End Date Taking? Authorizing Provider  acetaminophen (TYLENOL) 500 MG tablet Take 500-1,000 mg by mouth every 6 (six) hours as needed for mild pain or headache.    Yes [provider]  aspirin 81 MG tablet Take 81 mg by mouth daily.   Yes [provider]  atenolol (TENORMIN) 50 MG tablet TAKE 1 TABLET (50 MG TOTAL) BY  MOUTH DAILY. Patient taking differently: Take 25 mg by mouth daily.  03/02/17  Yes Troy Sine, MD  cetirizine (ZYRTEC) 10 MG tablet Take 10 mg by mouth daily.   Yes [provider]  citalopram (CELEXA) 20 MG tablet Take 20 mg by mouth daily. 09/13/15  Yes [provider]  dextromethorphan-guaiFENesin (MUCINEX DM) 30-600 MG 12hr tablet Take 1 tablet by mouth 2 (two) times daily as needed for cough.   Yes [provider]  furosemide (LASIX) 40 MG tablet Take 1 tablet (40 mg total) by mouth daily. Patient taking differently: Take 40 mg by mouth 2 (two) times daily.  03/26/17  Yes Mannam, Praveen, MD  losartan (COZAAR) 25 MG tablet Take 25 mg by mouth daily. 09/21/14  Yes [provider]  Omega-3 Fatty Acids (FISH OIL) 1200 MG CAPS Take 1,200 mg by mouth daily.   Yes [provider]  pravastatin (PRAVACHOL) 80 MG tablet TAKE 1 TABLET BY MOUTH DAILY. Patient taking differently: Take 80 mg by mouth once a day 03/11/17  Yes Troy Sine, MD  pseudoephedrine-guaifenesin [facility] D) 60-600 MG 12 hr tablet Take 1 tablet by mouth every 12 (twelve) hours.   Yes [provider]  STIOLTO RESPIMAT 2.5-2.5 MCG/ACT AERS INHALE 2 PUFFS INTO THE LUNGS DAILY. 10/21/16  Yes Mannam, Praveen, MD  tamsulosin (FLOMAX) 0.4 MG CAPS capsule Take 0.8 mg by mouth at bedtime.  09/16/15  Yes [provider]  vitamin C (ASCORBIC ACID) 500 MG tablet Take 500 mg by mouth daily.    Yes [provider]  vitamin E 400 UNIT capsule Take 400 Units by mouth daily.   Yes [provider]  arformoterol (BROVANA) 15 MCG/2ML NEBU Take 2 mLs (15 mcg total) by nebulization 2 (two) times daily. 03/24/17   Mannam, Hart Robinsons, MD  Glycopyrrolate (LONHALA MAGNAIR STARTER KIT) 25 MCG/ML SOLN Inhale 25 mLs into the lungs 2 (two) times daily. Dx code: j31.9 Patient taking differently: Inhale 25 mcg into the lungs 2 (two) times daily. Dx code: j43.9 03/24/17   Marshell Garfinkel, MD   HYDROcodone-acetaminophen (NORCO) 7.5-325 MG tablet Take 1 tablet by mouth every 6 (six) hours as needed for moderate pain. Patient not taking: Reported on 04/15/2017 06/18/16   Izora Gala, MD   Allergies  Allergen Reactions  . Lisinopril Cough   Review of Systems +feeling hungry Denies dyspnea  Physical Exam Frail elderly gentleman currently getting a breathing treatment awake alert resting in bed Shallow scattered rhonchi anterior lung fields diminished towards the bases S1-S2 Abdomen soft Has dry skin 1+ edema lower extremities left worse than right Awake alert Responds appropriately, verbalizes some, answers questions appropriately Does not appear to be in any acute distress  Vital Signs: BP 126/71   Pulse 73   Temp 98 F (36.7 C) (Oral)   Resp (!) 26   Ht 5' 7"  (1.702 m)   Wt 95.3 kg (210 lb)   SpO2 90%   BMI 32.89 kg/m  Pain Assessment: No/denies pain   Pain Score: 0-No pain   SpO2: SpO2: 90 % O2 Device:SpO2: 90 % O2 Flow Rate: .O2 Flow Rate (L/min): 12 L/min  IO: Intake/output summary:  Intake/Output Summary (Last 24 hours) at 04/02/17 1059 Last data filed at 04/02/17 8299  Gross per 24 hour  Intake              250 ml  Output              300 ml  Net              -50 ml    LBM:   Baseline Weight: Weight: 95.3 kg (210 lb) Most recent weight: Weight: 95.3 kg (210 lb)     Palliative Assessment/Data:   Flowsheet Rows     Most Recent Value  Intake Tab  Referral Department  Hospitalist  Unit at Time of Referral  Cardiac/Telemetry Unit  Palliative Care Primary Diagnosis  Pulmonary  Palliative Care Type  New Palliative care  Reason for referral  Clarify Goals of Care  Date first seen by Palliative Care  04/02/17  Clinical Assessment  Palliative Performance Scale Score  30%  Pain Max last 24 hours  4  Pain Min Last 24 hours  3  Dyspnea Max Last 24 Hours  4  Dyspnea Min Last 24 hours  3  Psychosocial & Spiritual Assessment  Palliative Care  Outcomes  Patient/Family meeting held?  Yes  Who was at the meeting?  patient, will call wife/children for family meeting after Pulmonary consultation.  Palliative Care Outcomes  Clarified goals of care      Time In:  9 Time Out:10   Time Total: 60 min   Greater than 50%  of this time was spent counseling and coordinating care related to the above assessment and plan.  Signed by: Loistine Chance, MD  [phone]  Please contact Palliative Medicine Team phone at [phone] for questions and concerns.  For individual provider: See Shea Evans

## 2017-04-02 NOTE — ED Notes (Signed)
Attempted report x 1; name and call back number provided 

## 2017-04-02 NOTE — Progress Notes (Signed)
[facility] PROGRESS NOTE  Stephen Dean PPI:[id number] DOB: 05/31/1929 DOA: 04/06/2017 PCP: Seward Carol, MD  Interim summary and HPI 83 y.o. male with medical history significant for CAD with history of remote PTCA to RCA and LAD, PVD with right AKA, hypertension, HLD, COPD with chronic hypoxemia on 4 L oxygen at home. This patient has had several weeks of worsening hypoxemia with congestion and nonproductive cough. He has not had any fevers or chills. He has not had any lower extremity edema. His PCP has treated him with 2 courses of antibiotics  (? Avelox and other unknown medication ) without improvement in symptoms. Imaging in July revealed bilateral pleural effusions right greater than left. His pulmonologist subsequent ordered outpatient thoracentesis that was accomplished on 7/26 with 1.7 L of fluid removed with cytology subsequently consistent with exudative effusion and atypical cells. Unfortunately within 24 hours of the thoracentesis patient felt generalized weakness and hypotension and was taken to the ER where he was given 500 mL of fluid. Since that time he has undergone a CT of the chest without contrast on 8/6 this revealed bilateral pleural effusions with groundglass attenuation and interstitial thickening compatible with moderate congestive heart failure. He returns to the ER today with worsening hypoxemia with increased O2 requirements. Was sent over from the PCPs office noting that his O2 saturation was 86% on 6 L oxygen and improved to 95% on a nonrebreather 15 L. PCXR in the ER consistent with interstitial pulmonary edema superimposed on chronic lung disease with emphysema. He currently appears comfortable on a Venturi mask at 50% with sats between 90-94%. Of note the pulmonologist obtained history that patient has been choking and coughing with eating and personally reviewed the recent CT scans which demonstrated food debris in the esophagus extending the entire length of  the esophagus towards the upper airways. Which suggested aspiration process.  Assessment/Plan: Acute on chronic resp failure with hypoxia: appears to be secondary to continue aspiration/pneumnitis  -patient with extensive hx of emphysema and at home uses 4L of oxygen supplementation at home -requiring 12L currently -will continue antibiotics to cover anaerobes, given aspiration main culprit of his problems. -will follow speech therapy recommendations for adjustment in diet consistency -PCCM on board and will follow any further recommendations  -will continue home nebulizer/inhaler for his emphysema -will also add IV solumedrol   2-Pleural effusion -per PCCM no need for further thoracentesis currently  -will follow 2-d echo ordered; but no signs of fluid overload and doubt CHF -las thoracentesis with atypical cells, lymphocytes and per criteria mainly transudate   3-HTN -BP was soft on presentation -stable and rising now -will resume b-blocker to prevent rebound tachycardia   4-Hypercalcemia/FTT -due to dehydration and poor oral intake -will continue gentle IVF's and will follow electrolytes trend   5-HLD -holding statins for now; following LFT's  6-transaminitis   -will hold statins for now -provide IVF's -follow LFT's trend   7-BPH -initially flomax was held given low BP -in order to prevent retention and given improvement in BP; will resume flomax now  8-depression -will resume celexa  9-hx of dementia: advance -will continue supportive care  -answering questions and able to follow commands appropriately currently.  Code Status: DNR Family Communication: no family at bedside  Disposition Plan: will remain in stepdown; patient DNR, but will like to use BIPAP if needed. Will follow speech therapy rec's and continue antibiotics. Palliative care consulted.   Consultants:  PCCM  Palliative care   Procedures:  See below  for x-ray reports   Antibiotics:  Unasyn  8/8  HPI/Subjective: Afebrile, no CP, no nausea, no vomiting. Patient denies abd pain. Receiving 12 L of oxygen supplementation and with tachypnea on minimal exertion.   Objective: Vitals:   04/02/17 1600 04/02/17 1735  BP: 111/68 100/65  Pulse: 97   Resp: (!) 35   Temp:    SpO2: 99%     Intake/Output Summary (Last 24 hours) at 04/02/17 1753 Last data filed at 04/02/17 1735  Gross per 24 hour  Intake             1840 ml  Output              325 ml  Net             1515 ml   Filed Weights   04/21/2017 1158  Weight: 95.3 kg (210 lb)    Exam:   General: patient denies CP and abd pain. Still with resp distress and tachypnea with minimal exertion. Requiring 12L of oxygen supplementation (O2 sat in 90-92)  Cardiovascular: RRR, no rubs, no gallops, no JVD seen   Respiratory: decrease air movement, no wheezing, no frank crackles  Abdomen: soft, NT, ND, positive BS  Musculoskeletal: no edema or cyanosis on LLE, Right AKA  Data Reviewed: Basic Metabolic Panel:  Recent Labs Lab 04/15/2017 1215 04/02/17 0333  NA 139 141  K 4.6 3.6  CL 97* 102  CO2 33* 28  GLUCOSE 124* 106*  BUN 32* 26*  CREATININE 1.10 0.80  CALCIUM 12.5* 11.8*   Liver Function Tests:  Recent Labs Lab 04/23/2017 1215 04/02/17 0333  AST 69* 50*  ALT 65* 64*  ALKPHOS 135* 124  BILITOT 1.5* 0.9  PROT 6.3* 6.2*  ALBUMIN 2.1* 2.0*    Recent Labs Lab 04/13/2017 1320  AMMONIA 17   CBC:  Recent Labs Lab 04/08/2017 1215 04/02/17 0333  WBC 15.3* 12.4*  NEUTROABS 12.9*  --   HGB 14.4 13.0  HCT 43.0 40.3  MCV 93.9 94.8  PLT 320 249   Cardiac Enzymes:  Recent Labs Lab 04/09/2017 1215  TROPONINI <0.03   BNP (last 3 results)  Recent Labs  04/19/2017 1215  BNP 42.5   CBG: No results for input(s): GLUCAP in the last 168 hours.  Recent Results (from the past 240 hour(s))  Blood culture (routine x 2)     Status: None (Preliminary result)   Collection Time: 04/22/2017 12:14 PM  Result Value  Ref Range Status   Specimen Description BLOOD LEFT ANTECUBITAL  Final   Special Requests   Final    BOTTLES DRAWN AEROBIC AND ANAEROBIC Blood Culture adequate volume   Culture NO GROWTH 1 DAY  Final   Report Status PENDING  Incomplete  Blood culture (routine x 2)     Status: None (Preliminary result)   Collection Time: 04/21/2017 12:19 PM  Result Value Ref Range Status   Specimen Description BLOOD LEFT HAND  Final   Special Requests   Final    BOTTLES DRAWN AEROBIC AND ANAEROBIC Blood Culture results may not be optimal due to an inadequate volume of blood received in culture bottles   Culture NO GROWTH 1 DAY  Final   Report Status PENDING  Incomplete  MRSA PCR Screening     Status: Abnormal   Collection Time: 04/02/17  8:13 AM  Result Value Ref Range Status   MRSA by PCR POSITIVE (A) NEGATIVE Final    Comment:  The GeneXpert MRSA Assay (FDA approved for NASAL specimens only), is one component of a comprehensive MRSA colonization surveillance program. It is not intended to diagnose MRSA infection nor to guide or monitor treatment for MRSA infections. RESULT CALLED TO, READ BACK BY AND VERIFIED WITH: Ivin Poot RN 11:10 04/02/17 ([email or web address])      Studies: Dg Chest Port 1 View  Result Date: 04/02/2017 CLINICAL DATA:  Acute on chronic respiratory failure, with hypoxia. Initial encounter. EXAM: PORTABLE CHEST 1 VIEW COMPARISON:  Chest radiograph performed 04/06/2017 FINDINGS: The lungs are well-aerated. Vascular congestion is noted. Diffuse bilateral airspace opacification raises concern for pulmonary edema, superimposed on chronic lung changes. A small right pleural effusion is noted. No pneumothorax is seen. The cardiomediastinal silhouette is normal in size. No acute osseous abnormalities are seen. IMPRESSION: Vascular congestion. Diffuse bilateral airspace opacification is similar to the recent prior study and concerning for pulmonary edema, superimposed on chronic lung changes. Small  right pleural effusion noted. Electronically Signed   By: Garald Balding M.D.   On: 04/02/2017 05:37   Dg Chest Portable 1 View  Result Date: 04/04/2017 CLINICAL DATA:  Shortness of breath. EXAM: PORTABLE CHEST 1 VIEW COMPARISON:  CT scan dated 03/30/2017 and chest x-ray dated 03/24/2017 FINDINGS: The patient has bilateral interstitial pulmonary edema, slightly improved since 03/24/2017. There is a loculated pleural effusion at the right lung base which appears slightly less prominent. No discrete left effusion. Heart size and pulmonary vascularity are within normal limits. Emphysematous changes in both upper lobes. IMPRESSION: 1. Interstitial pulmonary edema superimposed on chronic lung disease. 2. Emphysema. 3. Slight improvement in the pulmonary edema since 03/24/2017. Electronically Signed   By: Lorriane Shire M.D.   On: 04/02/2017 12:29    Scheduled Meds: . arformoterol  15 mcg Nebulization BID  . bisoprolol  10 mg Oral Daily  . Chlorhexidine Gluconate Cloth  6 each Topical Q0600  . citalopram  20 mg Oral Daily  . enoxaparin (LOVENOX) injection  40 mg Subcutaneous Daily  . Glycopyrrolate  25 mL Inhalation BID  . ipratropium-albuterol  3 mL Nebulization Q4H  . mupirocin ointment  1 application Nasal BID  . [START ON 04/03/2017] pneumococcal 23 valent vaccine  0.5 mL Intramuscular Tomorrow-1000  . sodium chloride flush  3 mL Intravenous Q12H  . tamsulosin  0.8 mg Oral QHS   Continuous Infusions: . sodium chloride 75 mL/hr (03/27/2017 1748)  . ampicillin-sulbactam (UNASYN) IV Stopped (04/02/17 1735)    Principal Problem:   Acute on chronic respiratory failure with hypoxia ([facility]) Active Problems:   Hyperlipidemia   Pleural effusion transudative   Hypertension   Coronary artery disease   Hypercalcemia   Transaminitis   Aspiration pneumonia ([place])   Dyspnea   Encounter for palliative care   Goals of care, counseling/discussion    Time spent: 30 minutes   Barton Dubois  [facility] Pager [phone]. If 7PM-7AM, please contact night-coverage at [email or web address], password [facility] 04/02/2017, 5:53 PM  LOS: 1 day

## 2017-04-02 NOTE — Progress Notes (Signed)
Modified Barium Swallow Progress Note  Patient Details  Name: Stephen Dean MRN: [id number] Date of Birth: 03-08-1929  Today's Date: 04/02/2017  Modified Barium Swallow completed.  Full report located under Chart Review in the Imaging Section.  Brief recommendations include the following:  Clinical Impression  Pt demonstrates a mild to moderate oropharyngeal dysphagia with structural and sensory deficits. Strength of oropharyngeal mechanism relatively good though mild base of tongue weakness suspected given vallecular residuals and appearance of small bony protrusions at C4/5 C5/6 that intermittently impede full epiglottic deflection. Furthermore, despite good timing of swallow initaition there is increased latency between laryngeal elevation/airway closure and hyoid excursion for opening of UES. This results in bolus sitting above the UES for a full second during the height of laryngeal elevation with airway fully closed, even with consecutive swallows. In some instances the bolus inflitrates the vestibule at this moment and is aspirated as the swallow completes. Pt has no sensation. Pt experienced respiratory and muscular fatigue during exam and residuals increased with further trace penetration/aspiration events post swallow. A chin tuck worsened function. Nectar thick liquids were tolerated well if cues for multiple swallows were given. Esophageal sweep also revealed solid stasis and pt noted to cough. Suspect respiratory decompensation and general deconditioning have worsened a mild dysphagia from baseline. Expect modest recovery during acute stay and would recommend EMST to large cough, base of tongue strength and vocal fold adduction (pt is dysphonic). For now recommend a dys 3/nectar thick diet with consideration for a water protocol as well.    Swallow Evaluation Recommendations       SLP Diet Recommendations: Dysphagia 3 (Mech soft) solids;Nectar thick liquid   Liquid Administration  via: Cup       Supervision: Full supervision/cueing for compensatory strategies   Compensations: Slow rate;Small sips/bites;Minimize environmental distractions;Multiple dry swallows after each bite/sip;Follow solids with liquid   Postural Changes: Seated upright at 90 degrees;Remain semi-upright after after feeds/meals (Comment)   Oral Care Recommendations: Oral care BID       Herbie Baltimore, MA CCC-SLP [phone]  Lynann Beaver 04/02/2017,2:00 PM

## 2017-04-02 NOTE — Evaluation (Signed)
Clinical/Bedside Swallow Evaluation Patient Details  Name: BRANDONN CAPELLI MRN: [id number] Date of Birth: 04/25/1929  Today's Date: 04/02/2017 Time: SLP Start Time (ACUTE ONLY): 1200 SLP Stop Time (ACUTE ONLY): 1220 SLP Time Calculation (min) (ACUTE ONLY): 20 min  Past Medical History:  Past Medical History:  Diagnosis Date  . Arthritis   . COPD (chronic obstructive pulmonary disease) ([place])   . Coronary artery disease   . Depression   . Dyspnea   . GERD (gastroesophageal reflux disease)   . Heart disease   . Hyperlipidemia   . Hypertension   . Infection of kidney    kidney infections  . Myocardial infarction ([employer])   . Peripheral vascular disease ([place])   . Skin cancer    Past Surgical History:  Past Surgical History:  Procedure Laterality Date  . AMPUTATION  06/18/2012   Procedure: AMPUTATION ABOVE KNEE;  Surgeon: Mal Misty, MD;  Location: [place];  Service: Vascular;  Laterality: Right;  . CARDIAC SURGERY     Stent placement x 2  . COLONOSCOPY    . EMBOLECTOMY  06/17/2012   Procedure: EMBOLECTOMY;  Surgeon: Conrad Longmont, MD;  Location: [facility];  Service: Vascular;  Laterality: Right;  Thrombectomy of right tibial arteries,exclusion of right popliteal aneurysm, femoral -popliteal artery bypass graft,four compartment fasciotomies  . EXCISION NASAL MASS Left 06/18/2016   Procedure: EXCISION NASAL MASS;  Surgeon: Izora Gala, MD;  Location: [facility];  Service: ENT;  Laterality: Left;  frozen section evaluation, possible skin graft, possible nasal labial flap.  Marland Kitchen EYE SURGERY    . FASCIOTOMY  06/17/2012   Procedure: FASCIOTOMY;  Surgeon: Conrad Wanette, MD;  Location: [place];  Service: Vascular;  Laterality: Right;  four compartment  fasciotomy  . FEMORAL-POPLITEAL BYPASS GRAFT  06/17/2012   Procedure: BYPASS GRAFT FEMORAL-POPLITEAL ARTERY;  Surgeon: Conrad P[place], MD;  Location: [facility];  Service: Vascular;  Laterality: Right;  Using 6 mm x 80 cm propaten goretex graft  . HERNIA REPAIR     . INTRAOPERATIVE ARTERIOGRAM  06/17/2012   Procedure: INTRA OPERATIVE ARTERIOGRAM;  Surgeon: Conrad Lochbuie, MD;  Location: [place];  Service: Vascular;  Laterality: Right;   HPI:  83 y.o.malewith past medical history of questionable dementia, history of chronic obstructive pulmonary disease-severe emphysema, underlying past medical history significant for gastroesophageal reflux disease, dyslipidemia, hypertension, myocardial infarction, peripheral vascular diseaseadmitted on 8/8/2018with severe acute hypoxic respiratory failure in the setting of possible aspiration pneumonia given family reprot of frequent choking on food, in the setting of severe COPD with centrilobular emphysema, recent bilateral pleural effusions transudative probably related to recurrent aspiration versus malignancy.    Assessment / Plan / Recommendation Clinical Impression  Pt demonstrates relatively good swallow function subjectively with no signs of aspiration until slightly after consuming solids. Pt reportedly has a history of GERD, but complains of few symptoms. His wife reports that coughing/choking occurs every 2-3 days, or sometimes less frequently. Recommend MBS for objective assessment of swallowing though pt may initiate a regular diet and thin liquids in the meantime.  SLP Visit Diagnosis: Dysphagia, unspecified (R13.10)    Aspiration Risk  Mild aspiration risk    Diet Recommendation Regular;Thin liquid   Liquid Administration via: Cup;Straw Medication Administration: Whole meds with liquid Supervision: Patient able to self feed Compensations: Slow rate;Small sips/bites Postural Changes: Seated upright at 90 degrees;Remain upright for at least 30 minutes after po intake    Other  Recommendations Oral Care Recommendations: Oral care BID  Follow up Recommendations 24 hour supervision/assistance      Frequency and Duration            Prognosis        Swallow Study   General HPI: 83  y.o.malewith past medical history of questionable dementia, history of chronic obstructive pulmonary disease-severe emphysema, underlying past medical history significant for gastroesophageal reflux disease, dyslipidemia, hypertension, myocardial infarction, peripheral vascular diseaseadmitted on 8/8/2018with severe acute hypoxic respiratory failure in the setting of possible aspiration pneumonia given family reprot of frequent choking on food, in the setting of severe COPD with centrilobular emphysema, recent bilateral pleural effusions transudative probably related to recurrent aspiration versus malignancy.  Type of Study: Bedside Swallow Evaluation Previous Swallow Assessment: none Diet Prior to this Study: NPO Temperature Spikes Noted: No Respiratory Status: Nasal cannula;Venti-mask (transitioned to high flow nasal cannula during assessment) History of Recent Intubation: No Behavior/Cognition: Alert;Cooperative;Pleasant mood Oral Cavity Assessment: Dry;Dried secretions Oral Care Completed by SLP: Yes Oral Cavity - Dentition: Dentures, top;Missing dentition Vision: Functional for self-feeding Self-Feeding Abilities: Able to feed self Patient Positioning: Upright in bed Baseline Vocal Quality: Hoarse;Low vocal intensity Volitional Cough: Congested;Weak Volitional Swallow: Able to elicit    Oral/Motor/Sensory Function Overall Oral Motor/Sensory Function: Within functional limits   Ice Chips Ice chips: Not tested   Thin Liquid Thin Liquid: Within functional limits Presentation: Cup;Straw;Self Fed    Nectar Thick Nectar Thick Liquid: Not tested   Honey Thick Honey Thick Liquid: Not tested   Puree Puree: Within functional limits   Solid   GO   Solid: Impaired Presentation: Self Fed Pharyngeal Phase Impairments: Cough - Delayed       Herbie [place], MA CCC-SLP [phone]  Erlinda Solinger, Katherene Ponto 04/02/2017,12:27 PM

## 2017-04-02 NOTE — Telephone Encounter (Signed)
Will hold until tomorrow to call

## 2017-04-02 NOTE — Telephone Encounter (Signed)
Received PA request for Brovana. Started another PA and this is what outcome posted.  The patient currently has access to the requested medication and a Prior Authorization is not needed for the patient/medication.  Patient is current inpatient status of medication change from previous message unknown. [facility] only [facility] on file and no A/B card on file. They will attempt to obtain and contact office if anything further needed.

## 2017-04-02 NOTE — Progress Notes (Signed)
Md paged regarding pt's HR in 130s. Orders for 5 mg metoprolol given. RN will continue to monitor.

## 2017-04-02 NOTE — Care Management Note (Addendum)
Case Management Note  Patient Details  Name: KASHIF POOLER MRN: [id number] Date of Birth: January 26, 1929  Subjective/Objective:   From home with wife, nonverbal, bedbound,has R AKA,  presents with resp failure secondarty to COPD and possible asp pna, severe emphysema, speech is seeing patient and palliative, conts on HFNC 12.   8/13 1536 Tomi Bamberger RN, BSN - patient with advanced dementia, conts to decline with asp pna,  tachycardia, dyspneic, using accessory muscles for respiration, nonverbal , unsafe with po's ,family has decided on comfort care, may consider residential hospice if appropriate in next 24-48 hrs.             Action/Plan: NCM will follow for dc needs.   Expected Discharge Date:                  Expected Discharge Plan:     In-House Referral:     Discharge planning Services  CM Consult  Post Acute Care Choice:    Choice offered to:     DME Arranged:    DME Agency:     HH Arranged:    HH Agency:     Status of Service:  In process, will continue to follow  If discussed at Long Length of Stay Meetings, dates discussed:    Additional Comments:  Zenon Mayo, RN 04/02/2017, 4:56 PM

## 2017-04-02 NOTE — Plan of Care (Signed)
Problem: Nutrition: Goal: Adequate nutrition will be maintained Outcome: Progressing Discussed with patient about plan of care and eating with new dysphagia 3 diet with some teach back displayed.  Comments: Topics: eating and chewing slow with oral suctioning discussed with RT

## 2017-04-02 NOTE — Progress Notes (Signed)
  Echocardiogram 2D Echocardiogram has been performed.  Johny Chess 04/02/2017, 5:47 PM

## 2017-04-03 ENCOUNTER — Ambulatory Visit: Payer: Medicare HMO | Admitting: Cardiovascular Disease

## 2017-04-03 DIAGNOSIS — I739 Peripheral vascular disease, unspecified: Secondary | ICD-10-CM

## 2017-04-03 DIAGNOSIS — Z89611 Acquired absence of right leg above knee: Secondary | ICD-10-CM

## 2017-04-03 LAB — COMPREHENSIVE METABOLIC PANEL
ALT: 56 U/L (ref 17–63)
AST: 45 U/L — ABNORMAL HIGH (ref 15–41)
Albumin: 1.8 g/dL — ABNORMAL LOW (ref 3.5–5.0)
Alkaline Phosphatase: 111 U/L (ref 38–126)
Anion gap: 7 (ref 5–15)
BUN: 23 mg/dL — ABNORMAL HIGH (ref 6–20)
CALCIUM: 11.4 mg/dL — AB (ref 8.9–10.3)
CHLORIDE: 105 mmol/L (ref 101–111)
CO2: 31 mmol/L (ref 22–32)
CREATININE: 0.76 mg/dL (ref 0.61–1.24)
Glucose, Bld: 131 mg/dL — ABNORMAL HIGH (ref 65–99)
Potassium: 3.9 mmol/L (ref 3.5–5.1)
Sodium: 143 mmol/L (ref 135–145)
TOTAL PROTEIN: 5.3 g/dL — AB (ref 6.5–8.1)
Total Bilirubin: 0.7 mg/dL (ref 0.3–1.2)

## 2017-04-03 LAB — CBC
HCT: 37.6 % — ABNORMAL LOW (ref 39.0–52.0)
Hemoglobin: 12.3 g/dL — ABNORMAL LOW (ref 13.0–17.0)
MCH: 30.8 pg (ref 26.0–34.0)
MCHC: 32.7 g/dL (ref 30.0–36.0)
MCV: 94 fL (ref 78.0–100.0)
PLATELETS: 237 10*3/uL (ref 150–400)
RBC: 4 MIL/uL — ABNORMAL LOW (ref 4.22–5.81)
RDW: 13.7 % (ref 11.5–15.5)
WBC: 11 10*3/uL — AB (ref 4.0–10.5)

## 2017-04-03 LAB — ECHOCARDIOGRAM COMPLETE
FS: 30 % (ref 28–44)
HEIGHTINCHES: 67 in
IVS/LV PW RATIO, ED: 0.83
LA diam index: 1.53 cm/m2
LASIZE: 33 mm
LEFT ATRIUM END SYS DIAM: 33 mm
LV PW d: 12 mm — AB (ref 0.6–1.1)
LVOT area: 3.14 cm2
LVOTD: 20 mm
WEIGHTICAEL: 3360 [oz_av]

## 2017-04-03 LAB — PROCALCITONIN

## 2017-04-03 MED ORDER — HYOSCYAMINE SULFATE ER 0.375 MG PO TB12
0.3750 mg | ORAL_TABLET | Freq: Two times a day (BID) | ORAL | Status: DC
  Administered 2017-04-03 – 2017-04-05 (×7): 0.375 mg via ORAL
  Filled 2017-04-03 (×10): qty 1

## 2017-04-03 MED ORDER — IPRATROPIUM-ALBUTEROL 0.5-2.5 (3) MG/3ML IN SOLN
3.0000 mL | Freq: Four times a day (QID) | RESPIRATORY_TRACT | Status: DC
  Administered 2017-04-03 (×2): 3 mL via RESPIRATORY_TRACT
  Filled 2017-04-03 (×2): qty 3

## 2017-04-03 NOTE — Telephone Encounter (Signed)
I have sent the appeal to the pharmacy and it can take about 3-7 days for this to be resolved.  Will follow back up.

## 2017-04-03 NOTE — Telephone Encounter (Signed)
Stephen Dean with [facility] is calling regarding the appeal for medication Brovana. I did not get a callback number because call was lost.

## 2017-04-03 NOTE — Progress Notes (Signed)
PULMONARY / CRITICAL CARE MEDICINE   Name: Stephen Dean MRN: [id number] DOB: [birth year]    ADMISSION DATE:  04/18/2017 CONSULTATION DATE:  04/23/2017  REFERRING MD:  Marily Memos  CHIEF COMPLAINT:  Dyspnea  HISTORY OF PRESENT ILLNESS:  83 y.o. male with presumable dementia (non-verbal, bed bound recently), who chokes on food and has emphysema was admitted from the [facility] ER on 8/8 with dyspnea.  He was evaluated by Dr. Concepcion Living for COPD earlier in 2018 for his COPD and in July 2018 he had a thoracentesis which drew off about 1.6L from the right lung, lymphocyte predominant fluid.  He presented to his PCP today with worsening hypoxemia and cyanosis.  His family notes that he frequently chokes on food.  They deny fever, chills, or worsening cough lately.  PCCM was consulted for acute on chronic respiratory failure.    SUBJECTIVE: Patient weaned from [place]. Patient reports breathing is improving. Continues to have nonproductive cough. Denies any chest pain, pressure, or tightness.  REVIEW OF SYSTEMS:  No subjective fever or chills. No abdominal pain or nausea.  VITAL SIGNS: BP (!) 102/48   Pulse 70   Temp 98.2 F (36.8 C) (Oral)   Resp (!) 26   Ht 5\' 7"  (1.702 m)   Wt 210 lb (95.3 kg)   SpO2 91%   BMI 32.89 kg/m   HEMODYNAMICS:    VENTILATOR SETTINGS:    INTAKE / OUTPUT: I/O last 3 completed shifts: In: 4098 [P.O.:480; I.V.:2650; IV Piggyback:300] Out: 550 [Urine:550]  PHYSICAL EXAMINATION: General:  Awake. No acute distress. Family at bedside.  Integument:  Warm & dry. No rash on exposed skin. No bruising. Lymphatics: No appreciated cervical or supraclavicular lymphadenopathy. HEENT:  Moist mucus membranes. No oral ulcers. No scleral injection or icterus.  Cardiovascular:  Regular rate. No appreciable JVD.  Pulmonary:  Coarse and distant breath sounds bilaterally. Mildly increased work of breathing on high flow nasal cannula. Abdomen: Soft. Normal bowel sounds.  Protuberant. Grossly nontender.  LABS:  BMET  Recent Labs Lab 04/14/2017 1215 04/02/17 0333 04/03/17 0455  NA 139 141 143  K 4.6 3.6 3.9  CL 97* 102 105  CO2 33* 28 31  BUN 32* 26* 23*  CREATININE 1.10 0.80 0.76  GLUCOSE 124* 106* 131*    Electrolytes  Recent Labs Lab 04/08/2017 1215 04/02/17 0333 04/03/17 0455  CALCIUM 12.5* 11.8* 11.4*    CBC  Recent Labs Lab 04/11/2017 1215 04/02/17 0333 04/03/17 0455  WBC 15.3* 12.4* 11.0*  HGB 14.4 13.0 12.3*  HCT 43.0 40.3 37.6*  PLT 320 249 237    Coag's No results for input(s): APTT, INR in the last 168 hours.  Sepsis Markers  Recent Labs Lab 04/06/2017 1225 04/12/2017 1717 04/21/2017 1718 04/14/2017 1730 04/03/17 0455  LATICACIDVEN 2.54*  --  2.0* 1.94*  --   PROCALCITON  --  0.10  --   --  <0.10    ABG No results for input(s): PHART, PCO2ART, PO2ART in the last 168 hours.  Liver Enzymes  Recent Labs Lab 04/13/2017 1215 04/02/17 0333 04/03/17 0455  AST 69* 50* 45*  ALT 65* 64* 56  ALKPHOS 135* 124 111  BILITOT 1.5* 0.9 0.7  ALBUMIN 2.1* 2.0* 1.8*    Cardiac Enzymes  Recent Labs Lab 04/10/2017 1215  TROPONINI <0.03    Glucose No results for input(s): GLUCAP in the last 168 hours.  Imaging No results found.   STUDIES:  PFT 01/31/16: FVC 2.30 L (87%) FEV1 1.19 L (42%)  FEV1/FVC 0.52 negative bronchodilator response DLCO corrected 38% R THORACENTESIS 7/26:  1.7 L hazy, amber fluid. Albumin 2.4 (serum 2.8), Total Protein 4.5 (serum 6.8), LDH 154, & WBC 2631 (78% lymph, 1% eos, 9% neutro, 12% monocyte). Culture negative. Atypical cells seen on cytology. CT CHEST 8/6: severe upper lobe predominant emphysema with lower lobe airspace disease; there is significant bilateral lower lobe pleural thickening with trace effusions R>L; patulous esophagus with food in it.  MICROBIOLOGY: MRSA PCR 8/9:  Positive Blood Cultures x2 8/8 >>> Urine Culture 8/8 >>>  ANTIBIOTICS: Unasyn 8/8 >>>  Azithro 8/8  >>>  SIGNIFICANT EVENTS: 8/08 - Admit  LINES/TUBES: PIV  DISCUSSION: 83 y/o male who is non-verbal who has severe emphysema and lower lobe airspace disease with bilateral effusions.  The most likely scenario here is that this gentleman aspirates (he frequently chokes on food) and has recurrent aspiration pneumonia.  I'm not entirely clear why the pleural fluid is lymphocyte predominant, so I suppose malignancy is also possible.  He is too sick to undergo a bronchoscopy to evaluate further right now.  DNR status noted, agree.  Would treat for aspiration pneumonia.  ASSESSMENT / PLAN:  83 y.o. male nonverbal at baseline with severe COPD and centrilobular emphysema. Patient with aspiration pneumonia that is recurrent and acute on chronic hypoxic respiratory failure. Palliative medicine has been consulted given the patient's aspiration and overall poor prognosis. I am concerned given the concordant exudate and atypical cells seen on his previous pleural fluid that malignancy could be present.  1. Recurrent aspiration pneumonia: Evaluated by speech and recommended for dysphagia 3 diet. Recommend continuing treatment with current antibiotics regimen but can likely discontinue Azithromycin after 5 days.  2. Acute on chronic hypoxic respiratory failure: Recommend continuing pulmonary toilet and weaning FiO2 for Sat 90-94%. 3. Severe COPD:  Discontinuing Brovana. Recommend continuing Duonebs every 6 hours. Can likely start to taper Solu-Medrol starting tomorrow to IV every 12 hours. 4. Exudative right pleural effusion: Certainly could be secondary to malignancy given atypical cells seen on cytology. Plan for repeat thoracentesis as fluid re-accumulates. Recommend repeat x-ray imaging on Monday. 5. Disposition/plan of care: Agree with continued discussions with palliative medicine. I will be available over the weekend.  I have spent a total of 36 minutes of time today caring for the patient, reviewing the  patient's electronic medical record, and with more than 50% of that time spent coordinating care with the patient as well as reviewing the continuing plan of care with the patient and family at bedside.  PCCM will see the patient again on Monday (8/13). Please contact me if there are any questions or concerns over the weekend.   Sonia Baller Ashok Cordia, M.D. [facility] Pulmonary [facility] Critical [facility] Pager:  [phone] After 3pm or if no response, call [phone] 04/03/2017, 3:00 PM

## 2017-04-03 NOTE — Telephone Encounter (Signed)
[facility] [address] and was advised that they did not attempt to contact our office. [facility] then [facility]. [facility] states it appears that pt's insurance had attempted to contact our office. I was able to be connected to pt's insurance via [facility].  [facility] states Garlon Hatchet 9mcg is covered through pt's secondary insurance under part B, but was denied under part D. [facility] request that we make pt's pharmacy aware to file medication under pt's secondary part B. [facility] states she is going to close out appeal that was started today, as it is not needed.   [facility] spoken with [facility] and made [facility] aware. [facility] states [facility] do not have pt's part B on file, but has lm for pt's wife regarding this matter. Lm for pt's souse to make her aware.

## 2017-04-03 NOTE — Plan of Care (Signed)
Problem: Pain Managment: Goal: General experience of comfort will improve Outcome: Progressing Discussed with patient about plan of care for the evening with some teach back displayed.

## 2017-04-03 NOTE — Progress Notes (Signed)
  Speech Language Pathology Treatment: Dysphagia  Patient Details Name: Stephen Dean MRN: [id number] DOB: 1929-08-05 Today's Date: 04/03/2017 Time: 1350-1420 SLP Time Calculation (min) (ACUTE ONLY): 30 min  Assessment / Plan / Recommendation Clinical Impression  Pt demonstrates ability to consume lunch with assist from SLP and family. Reinforced upright posture, slow rate, cues for second swallow, following solids with liquids. Even with these precautions pt has ongoing delayed coughing, likely indicative of ongoing aspiration events, baseline COPD and also sensation of esophageal stasis. SLP and MD reinforced poor prognosis for resolution of dysphagia and persistent risk of aspiration despite diet modification and precautions. Reinforced precautions with family and staff. Continue current diet.   HPI HPI: 83 y.o.malewith past medical history of questionable dementia, history of chronic obstructive pulmonary disease-severe emphysema, underlying past medical history significant for gastroesophageal reflux disease, dyslipidemia, hypertension, myocardial infarction, peripheral vascular diseaseadmitted on 8/8/2018with severe acute hypoxic respiratory failure in the setting of possible aspiration pneumonia given family reprot of frequent choking on food, in the setting of severe COPD with centrilobular emphysema, recent bilateral pleural effusions transudative probably related to recurrent aspiration versus malignancy.       SLP Plan  Continue with current plan of care       Recommendations  Diet recommendations: Dysphagia 3 (mechanical soft);Nectar-thick liquid Liquids provided via: Cup;Straw Medication Administration: Whole meds with liquid Supervision: Full supervision/cueing for compensatory strategies Compensations: Slow rate;Small sips/bites;Minimize environmental distractions;Multiple dry swallows after each bite/sip;Follow solids with liquid                Oral Care  Recommendations: Oral care BID Follow up Recommendations: Skilled Nursing facility SLP Visit Diagnosis: Dysphagia, oropharyngeal phase (R13.12) Plan: Continue with current plan of care       GO               [facility], MA CCC-SLP [phone]  Lynann Beaver 04/03/2017, 3:14 PM

## 2017-04-03 NOTE — Progress Notes (Signed)
Nutrition Brief Note  Patient identified on the Malnutrition Screening Tool (MST) Report.  Wt Readings from Last 15 Encounters:  04/21/2017 210 lb (95.3 kg)  03/24/17 210 lb (95.3 kg)  03/19/17 200 lb (90.7 kg)  11/25/16 210 lb (95.3 kg)  11/18/16 210 lb (95.3 kg)  06/18/16 210 lb 9.6 oz (95.5 kg)  06/12/16 210 lb 9.6 oz (95.5 kg)  01/31/16 210 lb (95.3 kg)  01/11/16 212 lb (96.2 kg)  10/11/15 212 lb (96.2 kg)  06/19/15 209 lb (94.8 kg)  12/14/14 210 lb 8 oz (95.5 kg)  06/21/14 207 lb 4.8 oz (94 kg)  12/22/13 202 lb 14.4 oz (92 kg)  03/24/13 188 lb 12.8 oz (85.6 kg)   Body mass index is 32.89 kg/m. Patient meets criteria for Obesity Class I based on current BMI.   Current diet order is Dysphagia 3-Nectar thick liquids, patient's average meal completion is 65% at this time. Labs and medications reviewed.   No nutrition interventions warranted at this time. If nutrition issues arise, please consult RD.   Arthur Holms, RD, LDN Pager #: [phone] After-Hours Pager #: [phone]

## 2017-04-03 NOTE — Progress Notes (Signed)
[facility] PROGRESS NOTE  Stephen Dean GMW:[id number] DOB: 1929-01-09 DOA: 04/14/2017 PCP: Seward Carol, MD  Interim summary and HPI 83 y.o. male with medical history significant for CAD with history of remote PTCA to RCA and LAD, PVD with right AKA, hypertension, HLD, COPD with chronic hypoxemia on 4 L oxygen at home. This patient has had several weeks of worsening hypoxemia with congestion and nonproductive cough. He has not had any fevers or chills. He has not had any lower extremity edema. His PCP has treated him with 2 courses of antibiotics  (? Avelox and other unknown medication ) without improvement in symptoms. Imaging in July revealed bilateral pleural effusions right greater than left. His pulmonologist subsequent ordered outpatient thoracentesis that was accomplished on 7/26 with 1.7 L of fluid removed with cytology subsequently consistent with exudative effusion and atypical cells. Unfortunately within 24 hours of the thoracentesis patient felt generalized weakness and hypotension and was taken to the ER where he was given 500 mL of fluid. Since that time he has undergone a CT of the chest without contrast on 8/6 this revealed bilateral pleural effusions with groundglass attenuation and interstitial thickening compatible with moderate congestive heart failure. He returns to the ER today with worsening hypoxemia with increased O2 requirements. Was sent over from the PCPs office noting that his O2 saturation was 86% on 6 L oxygen and improved to 95% on a nonrebreather 15 L. PCXR in the ER consistent with interstitial pulmonary edema superimposed on chronic lung disease with emphysema. He currently appears comfortable on a Venturi mask at 50% with sats between 90-94%. Of note the pulmonologist obtained history that patient has been choking and coughing with eating and personally reviewed the recent CT scans which demonstrated food debris in the esophagus extending the entire length of  the esophagus towards the upper airways. Which suggested aspiration process.   Assessment/Plan: Acute on chronic resp failure with hypoxia: appears to be secondary to continue aspiration/pneumnitis  -patient with extensive hx of emphysema and oxygen dependency at home -slight improvement; oxygen requirement down to 10L (uses 4L chronically at home; and was requiring non-rebreathing on admission)  -will continue antibiotics to cover anaerobes, given aspiration main culprit of his problems. On unasyn -per speech therapy with continue dysphagia 3 diet and nectar thick liquids  -PCCM on board and will follow any further recommendations  -will continue home nebulizer/inhaler for his emphysema; will follow rec;s from pulmonary service  -will continue IV solumedrol for now and follow low tapering (starting on 8/12)  2-Pleural effusion -per PCCM no need for further thoracentesis currently  -will follow 2-d echo ordered; but no signs of fluid overload and doubt CHF -last thoracentesis with atypical cells, lymphocytes and per criteria mainly transudate -concerns for underlying metastasis -per PCCM plan is to reassess reaccumulation on Monday and most liekly repeat thoracentesis    3-HTN -BP was soft on presentation; most likely due to use of home medications in setting of some dehydration  -stable and rising now -will continue B-blocker and flomax   4-Hypercalcemia/FTT -due to dehydration and poor oral intake most liekly -will continue gentle IVF's and will follow electrolytes trend  -Calcium 11.4 today   5-HLD -will continue holding statins for now -see below, will follow LFT's trend   6-transaminitis   -holding statins for now -will repeat CMET intermittently -LFT's improved and close to normal   7-BPH -continue flomax   8-depression -continue celexa  9-hx of dementia: advanced -continue supportive care and constant reorientation   -  discussed with family risk of ongoing  aspiration with underlying dementia.   10-lactic acidosis:L most likely from dehydration resp acidosis  -Improved with IVF's -will monitor   Code Status: DNR Family Communication: no family at bedside  Disposition Plan: will remain in stepdown for another 24 hours; patient DNR, but will like to use BIPAP if needed. Will follow speech therapy rec's for diet consistency and will continue antibiotics. Palliative care consulted. Will also follow rec's from pulmonary service.  Consultants:  PCCM  Palliative care   Procedures:  See below for x-ray reports   Antibiotics:  Unasyn 8/8  HPI/Subjective: Afebrile, no CP, no nausea or vomiting. Still with resp distress, easily tachypneic and and using 10L of oxygen. Positive wet cough and some gargling on exam.  Objective: Vitals:   04/03/17 1600 04/03/17 1609  BP:  120/65  Pulse:  77  Resp:  15  Temp:  97.8 F (36.6 C)  SpO2: (!) 89% (!) 88%    Intake/Output Summary (Last 24 hours) at 04/03/17 1756 Last data filed at 04/03/17 1340  Gross per 24 hour  Intake             2120 ml  Output              625 ml  Net             1495 ml   Filed Weights   04/09/2017 1158  Weight: 95.3 kg (210 lb)    Exam:   General: Patient denies CP and abd pain. Some increase gargling and wet cough appreciated today. Some slight improvement in his resp distress with ability to decrease oxygen supplementation to 10L. Patient oriented x1 mainly; was able to follow commands.   Cardiovascular: slightly tachycardic, no rubs, no gallops, no JVD   Respiratory: fair air movement, positive exp wheezing, no frank crackles  Abdomen: soft, NT, ND, positive BS  Musculoskeletal: no edema, no cyanosis or clubbing on his LLE. Patient with right AKA.   Data Reviewed: Basic Metabolic Panel:  Recent Labs Lab 03/31/2017 1215 04/02/17 0333 04/03/17 0455  NA 139 141 143  K 4.6 3.6 3.9  CL 97* 102 105  CO2 33* 28 31  GLUCOSE 124* 106* 131*  BUN 32* 26*  23*  CREATININE 1.10 0.80 0.76  CALCIUM 12.5* 11.8* 11.4*   Liver Function Tests:  Recent Labs Lab 04/09/2017 1215 04/02/17 0333 04/03/17 0455  AST 69* 50* 45*  ALT 65* 64* 56  ALKPHOS 135* 124 111  BILITOT 1.5* 0.9 0.7  PROT 6.3* 6.2* 5.3*  ALBUMIN 2.1* 2.0* 1.8*    Recent Labs Lab 04/21/2017 1320  AMMONIA 17   CBC:  Recent Labs Lab 04/09/2017 1215 04/02/17 0333 04/03/17 0455  WBC 15.3* 12.4* 11.0*  NEUTROABS 12.9*  --   --   HGB 14.4 13.0 12.3*  HCT 43.0 40.3 37.6*  MCV 93.9 94.8 94.0  PLT 320 249 237   Cardiac Enzymes:  Recent Labs Lab 04/18/2017 1215  TROPONINI <0.03   BNP (last 3 results)  Recent Labs  04/19/2017 1215  BNP 42.5   CBG: No results for input(s): GLUCAP in the last 168 hours.  Recent Results (from the past 240 hour(s))  Blood culture (routine x 2)     Status: None (Preliminary result)   Collection Time: 04/15/2017 12:14 PM  Result Value Ref Range Status   Specimen Description BLOOD LEFT ANTECUBITAL  Final   Special Requests   Final    BOTTLES DRAWN AEROBIC  AND ANAEROBIC Blood Culture adequate volume   Culture NO GROWTH 2 DAYS  Final   Report Status PENDING  Incomplete  Blood culture (routine x 2)     Status: None (Preliminary result)   Collection Time: 04/19/2017 12:19 PM  Result Value Ref Range Status   Specimen Description BLOOD LEFT HAND  Final   Special Requests   Final    BOTTLES DRAWN AEROBIC AND ANAEROBIC Blood Culture results may not be optimal due to an inadequate volume of blood received in culture bottles   Culture NO GROWTH 2 DAYS  Final   Report Status PENDING  Incomplete  MRSA PCR Screening     Status: Abnormal   Collection Time: 04/02/17  8:13 AM  Result Value Ref Range Status   MRSA by PCR POSITIVE (A) NEGATIVE Final    Comment:        The GeneXpert MRSA Assay (FDA approved for NASAL specimens only), is one component of a comprehensive MRSA colonization surveillance program. It is not intended to diagnose  MRSA infection nor to guide or monitor treatment for MRSA infections. RESULT CALLED TO, READ BACK BY AND VERIFIED WITH: Ivin Poot RN 11:10 04/02/17 ([email or web address])      Studies: Dg Chest Port 1 View  Result Date: 04/02/2017 CLINICAL DATA:  Acute on chronic respiratory failure, with hypoxia. Initial encounter. EXAM: PORTABLE CHEST 1 VIEW COMPARISON:  Chest radiograph performed 04/14/2017 FINDINGS: The lungs are well-aerated. Vascular congestion is noted. Diffuse bilateral airspace opacification raises concern for pulmonary edema, superimposed on chronic lung changes. A small right pleural effusion is noted. No pneumothorax is seen. The cardiomediastinal silhouette is normal in size. No acute osseous abnormalities are seen. IMPRESSION: Vascular congestion. Diffuse bilateral airspace opacification is similar to the recent prior study and concerning for pulmonary edema, superimposed on chronic lung changes. Small right pleural effusion noted. Electronically Signed   By: Garald Balding M.D.   On: 04/02/2017 05:37    Scheduled Meds: . bisoprolol  10 mg Oral Daily  . Chlorhexidine Gluconate Cloth  6 each Topical Q0600  . citalopram  20 mg Oral Daily  . enoxaparin (LOVENOX) injection  40 mg Subcutaneous Daily  . Glycopyrrolate  25 mL Inhalation BID  . hyoscyamine  0.375 mg Oral Q12H  . ipratropium-albuterol  3 mL Nebulization Q6H  . methylPREDNISolone (SOLU-MEDROL) injection  40 mg Intravenous Q12H  . mupirocin ointment  1 application Nasal BID  . sodium chloride flush  3 mL Intravenous Q12H  . tamsulosin  0.8 mg Oral QHS   Continuous Infusions: . sodium chloride 75 mL/hr at 04/03/17 0518  . ampicillin-sulbactam (UNASYN) IV Stopped (04/03/17 1126)    Principal Problem:   Acute on chronic respiratory failure with hypoxia (HCC) Active Problems:   Hyperlipidemia   Pleural effusion transudative   Hypertension   Coronary artery disease   Hypercalcemia   Transaminitis   Aspiration pneumonia  ([place])   Dyspnea   Encounter for palliative care   Goals of care, counseling/discussion    Time spent: 30 minutes   Barton Dubois  [facility] Pager [phone]. If 7PM-7AM, please contact night-coverage at [email or web address], password [facility] 04/03/2017, 5:56 PM  LOS: 2 days

## 2017-04-04 DIAGNOSIS — I5022 Chronic systolic (congestive) heart failure: Secondary | ICD-10-CM

## 2017-04-04 LAB — URINE CULTURE: CULTURE: NO GROWTH

## 2017-04-04 LAB — MAGNESIUM: Magnesium: 1.7 mg/dL (ref 1.7–2.4)

## 2017-04-04 LAB — BASIC METABOLIC PANEL
Anion gap: 8 (ref 5–15)
BUN: 20 mg/dL (ref 6–20)
CALCIUM: 11 mg/dL — AB (ref 8.9–10.3)
CHLORIDE: 103 mmol/L (ref 101–111)
CO2: 27 mmol/L (ref 22–32)
CREATININE: 0.69 mg/dL (ref 0.61–1.24)
GFR calc Af Amer: >60 mL/min (ref >60)
GFR calc non Af Amer: >60 mL/min (ref >60)
Glucose, Bld: 135 mg/dL — ABNORMAL HIGH (ref 65–99)
Potassium: 3.8 mmol/L (ref 3.5–5.1)
SODIUM: 138 mmol/L (ref 135–145)

## 2017-04-04 MED ORDER — POTASSIUM CHLORIDE 10 MEQ/100ML IV SOLN
10.0000 meq | INTRAVENOUS | Status: AC
  Administered 2017-04-04 (×2): 10 meq via INTRAVENOUS
  Filled 2017-04-04 (×2): qty 100

## 2017-04-04 MED ORDER — IPRATROPIUM-ALBUTEROL 0.5-2.5 (3) MG/3ML IN SOLN
3.0000 mL | RESPIRATORY_TRACT | Status: DC | PRN
  Administered 2017-04-04: 3 mL via RESPIRATORY_TRACT

## 2017-04-04 MED ORDER — IPRATROPIUM-ALBUTEROL 0.5-2.5 (3) MG/3ML IN SOLN: 3.0000 mL | RESPIRATORY_TRACT | Status: DC | PRN

## 2017-04-04 MED ORDER — METOPROLOL TARTRATE 5 MG/5ML IV SOLN: 2.5000 mg | INTRAVENOUS | Status: DC | PRN

## 2017-04-04 MED ORDER — METOPROLOL TARTRATE 5 MG/5ML IV SOLN
2.5000 mg | Freq: Three times a day (TID) | INTRAVENOUS | Status: DC | PRN
  Administered 2017-04-04: 2.5 mg via INTRAVENOUS
  Filled 2017-04-04: qty 5

## 2017-04-04 MED ORDER — IPRATROPIUM-ALBUTEROL 0.5-2.5 (3) MG/3ML IN SOLN
3.0000 mL | Freq: Three times a day (TID) | RESPIRATORY_TRACT | Status: DC
  Administered 2017-04-04: 3 mL via RESPIRATORY_TRACT
  Filled 2017-04-04: qty 3

## 2017-04-04 MED ORDER — MAGNESIUM SULFATE 2 GM/50ML IV SOLN
2.0000 g | Freq: Once | INTRAVENOUS | Status: AC
  Administered 2017-04-04: 2 g via INTRAVENOUS
  Filled 2017-04-04: qty 50

## 2017-04-04 MED ORDER — METOPROLOL TARTRATE 5 MG/5ML IV SOLN
2.5000 mg | INTRAVENOUS | Status: DC | PRN
  Administered 2017-04-04: 5 mg via INTRAVENOUS
  Administered 2017-04-04: 2.5 mg via INTRAVENOUS
  Administered 2017-04-06: 5 mg via INTRAVENOUS
  Filled 2017-04-04 (×4): qty 5

## 2017-04-04 MED ORDER — IPRATROPIUM-ALBUTEROL 0.5-2.5 (3) MG/3ML IN SOLN
3.0000 mL | Freq: Four times a day (QID) | RESPIRATORY_TRACT | Status: DC
Start: 2017-04-04 — End: 2017-04-06
  Administered 2017-04-04 – 2017-04-06 (×6): 3 mL via RESPIRATORY_TRACT
  Filled 2017-04-04 (×7): qty 3

## 2017-04-04 NOTE — Plan of Care (Signed)
Problem: Fluid Volume: Goal: Ability to maintain a balanced intake and output will improve Outcome: Progressing Discussed with patient and on call MD about concerns with his tachycardia and tachypnea (see MAR).  Patient reeducated on how to cough and deep breath with some teach back displayed.

## 2017-04-04 NOTE — Progress Notes (Signed)
[facility] PROGRESS NOTE  Stephen Dean FUX:[id number] DOB: 04-Jul-1929 DOA: 03/26/2017 PCP: Seward Carol, MD  Interim summary and HPI 83 y.o. male with medical history significant for CAD with history of remote PTCA to RCA and LAD, PVD with right AKA, hypertension, HLD, COPD with chronic hypoxemia on 4 L oxygen at home. This patient has had several weeks of worsening hypoxemia with congestion and nonproductive cough. He has not had any fevers or chills. He has not had any lower extremity edema. His PCP has treated him with 2 courses of antibiotics  (? Avelox and other unknown medication ) without improvement in symptoms. Imaging in July revealed bilateral pleural effusions right greater than left. His pulmonologist subsequent ordered outpatient thoracentesis that was accomplished on 7/26 with 1.7 L of fluid removed with cytology subsequently consistent with exudative effusion and atypical cells. Unfortunately within 24 hours of the thoracentesis patient felt generalized weakness and hypotension and was taken to the ER where he was given 500 mL of fluid. Since that time he has undergone a CT of the chest without contrast on 8/6 this revealed bilateral pleural effusions with groundglass attenuation and interstitial thickening compatible with moderate congestive heart failure. He returns to the ER today with worsening hypoxemia with increased O2 requirements. Was sent over from the PCPs office noting that his O2 saturation was 86% on 6 L oxygen and improved to 95% on a nonrebreather 15 L. PCXR in the ER consistent with interstitial pulmonary edema superimposed on chronic lung disease with emphysema. He currently appears comfortable on a Venturi mask at 50% with sats between 90-94%. Of note the pulmonologist obtained history that patient has been choking and coughing with eating and personally reviewed the recent CT scans which demonstrated food debris in the esophagus extending the entire length of  the esophagus towards the upper airways. Which suggested aspiration process.   Assessment/Plan: Acute on chronic resp failure with hypoxia: appears to be secondary to continue aspiration/pneumnitis  -patient with extensive hx of emphysema and oxygen dependency at home -slight improvement; oxygen requirement down to 10L (uses 4L chronically at home; and was requiring non-rebreathing on admission)  -will continue antibiotics to cover anaerobes, given aspiration main culprit of his problems. On unasyn -per speech therapy with continue dysphagia 3 diet and nectar thick liquids  -PCCM on board and will follow any further recommendations  -will continue home nebulizer/inhaler for his emphysema; will follow rec;s from pulmonary service  -will continue IV solumedrol for now at current dose and follow low tapering (starting on 8/12)  2-Pleural effusion -per PCCM no need for further thoracentesis currently  -2-D echo with mild systolic HF. Will follow daily weights and follow response. No frank crackles on exam. -last thoracentesis with atypical cells, lymphocytes and per criteria mainly transudate -concerns for underlying metastasis -per PCCM plan is to reassess reaccumulation on Monday and most liekly repeat thoracentesis    3-HTN -BP was soft on presentation; most likely due to use of home medications in setting of some dehydration  -stable and rising now -will continue B-blocker and flomax   4-Hypercalcemia/FTT -due to dehydration and poor oral intake most liekly -will continue gentle IVF's (rate to be adjusted today) and will follow electrolytes trend   5-HLD -will continue holding statins for now -see below, will follow LFT's trend   6-transaminitis   -continue holding statins for now -will repeat CMET intermittently -LFT's improved and essentially back to normal  -will repeat CMET in am.   7-BPH -Will continue  Flomax. -No signs of urinary retention.    8-depression -Will  continue Celexa.   9-hx of dementia: advanced -continue supportive care and constant reorientation   -discussed with family risk of ongoing aspiration with underlying dementia. -They have noticed continue intermittent episodes of coughing/gargling sounds in his throat.   10-lactic acidosis:L most likely from dehydration resp acidosis  -Improved with IVF's -repeat level in am  [id number] systolic heart failure -Compensated at this moment -Ejection fraction 45-50% -Unable to assess for wall motion abnormality given poor acoustic windows.   Code Status: DNR Family Communication: no family at bedside  Disposition Plan: will remain in stepdown for another 24 hours; patient DNR, but will like to use BIPAP if needed. Will follow speech therapy rec's for diet consistency and will continue antibiotics. Palliative care consulted. Will also follow rec's from pulmonary service.  Consultants:  PCCM  Palliative care   Procedures:  See below for x-ray reports   2-d echo Procedure narrative: Transthoracic echocardiography. Image   quality was poor. The study was technically difficult, as a   result of poor acoustic windows. Intravenous contrast (Definity)   was administered. - Left ventricle: No good apical windows and definity images with   contrast are not adequate. In some views heart rate is fast and   defficult to accurately estimate EF but visually appears to be   45-50% The cavity size was normal. Wall thickness was increased   in a pattern of moderate LVH. Systolic function was mildly   reduced. The estimated ejection fraction was in the range of 45%   to 50%. Regional wall motion abnormalities cannot be excluded  Antibiotics:  Unasyn 8/8  HPI/Subjective: Afebrile and denying chest pain. Still requiring significant high flow oxygen supplementation and the tachypnea With minimal exertion. He reports some improvement in his respiratory status. Patient is able to follow commands  and is oriented 2.  Objective: Vitals:   04/04/17 1452 04/04/17 1602  BP:  120/86  Pulse:  (!) 104  Resp:  (!) 23  Temp:  97.8 F (36.6 C)  SpO2: 90% (!) 89%    Intake/Output Summary (Last 24 hours) at 04/04/17 1605 Last data filed at 04/04/17 0900  Gross per 24 hour  Intake             2565 ml  Output             1025 ml  Net             1540 ml   Filed Weights   04/18/2017 1158 04/04/17 0317  Weight: 95.3 kg (210 lb) 82.3 kg (181 lb 7 oz)    Exam:   General: Alert, awake and oriented 2 today, conversive and able to follow commands. Denies chest pain and reports some improvement in his breathing. No nausea, no vomiting, per family still with intermittent episodes of coughing and gargling sound. Patient continued to require 10 L of oxygen supplementation; he remains tachypneic with minimal exertion.  Cardiovascular: Slightly tachycardic, no rubs, no gallops, no JVD.    Respiratory: Some improvement in his air movement, no crackles, positive rhonchi mild expiratory wheezing.   Abdomen: Soft, nontender, nondistended, positive bowel sounds  Musculoskeletal: No edema, no cyanosis or clubbing on his left lower extremity. Patient with right AKA; no open wounds seen on his stump.  Data Reviewed: Basic Metabolic Panel:  Recent Labs Lab 04/06/2017 1215 04/02/17 0333 04/03/17 0455  NA 139 141 143  K 4.6 3.6 3.9  CL 97*  102 105  CO2 33* 28 31  GLUCOSE 124* 106* 131*  BUN 32* 26* 23*  CREATININE 1.10 0.80 0.76  CALCIUM 12.5* 11.8* 11.4*   Liver Function Tests:  Recent Labs Lab 03/29/2017 1215 04/02/17 0333 04/03/17 0455  AST 69* 50* 45*  ALT 65* 64* 56  ALKPHOS 135* 124 111  BILITOT 1.5* 0.9 0.7  PROT 6.3* 6.2* 5.3*  ALBUMIN 2.1* 2.0* 1.8*    Recent Labs Lab 03/26/2017 1320  AMMONIA 17   CBC:  Recent Labs Lab 03/30/2017 1215 04/02/17 0333 04/03/17 0455  WBC 15.3* 12.4* 11.0*  NEUTROABS 12.9*  --   --   HGB 14.4 13.0 12.3*  HCT 43.0 40.3 37.6*  MCV  93.9 94.8 94.0  PLT 320 249 237   Cardiac Enzymes:  Recent Labs Lab 04/06/2017 1215  TROPONINI <0.03   BNP (last 3 results)  Recent Labs  04/12/2017 1215  BNP 42.5   CBG: No results for input(s): GLUCAP in the last 168 hours.  Recent Results (from the past 240 hour(s))  Blood culture (routine x 2)     Status: None (Preliminary result)   Collection Time: 04/14/2017 12:14 PM  Result Value Ref Range Status   Specimen Description BLOOD LEFT ANTECUBITAL  Final   Special Requests   Final    BOTTLES DRAWN AEROBIC AND ANAEROBIC Blood Culture adequate volume   Culture NO GROWTH 3 DAYS  Final   Report Status PENDING  Incomplete  Blood culture (routine x 2)     Status: None (Preliminary result)   Collection Time: 04/04/2017 12:19 PM  Result Value Ref Range Status   Specimen Description BLOOD LEFT HAND  Final   Special Requests   Final    BOTTLES DRAWN AEROBIC AND ANAEROBIC Blood Culture results may not be optimal due to an inadequate volume of blood received in culture bottles   Culture NO GROWTH 3 DAYS  Final   Report Status PENDING  Incomplete  Urine Culture     Status: None   Collection Time: 04/02/2017  5:10 PM  Result Value Ref Range Status   Specimen Description URINE, RANDOM  Final   Special Requests NONE  Final   Culture NO GROWTH  Final   Report Status 04/04/2017 FINAL  Final  MRSA PCR Screening     Status: Abnormal   Collection Time: 04/02/17  8:13 AM  Result Value Ref Range Status   MRSA by PCR POSITIVE (A) NEGATIVE Final    Comment:        The GeneXpert MRSA Assay (FDA approved for NASAL specimens only), is one component of a comprehensive MRSA colonization surveillance program. It is not intended to diagnose MRSA infection nor to guide or monitor treatment for MRSA infections. RESULT CALLED TO, READ BACK BY AND VERIFIED WITH: Ivin Poot RN 11:10 04/02/17 ([email or web address])      Studies: No results found.  Scheduled Meds: . bisoprolol  10 mg Oral Daily  . Chlorhexidine  Gluconate Cloth  6 each Topical Q0600  . citalopram  20 mg Oral Daily  . enoxaparin (LOVENOX) injection  40 mg Subcutaneous Daily  . hyoscyamine  0.375 mg Oral Q12H  . ipratropium-albuterol  3 mL Nebulization Q6H  . methylPREDNISolone (SOLU-MEDROL) injection  40 mg Intravenous Q12H  . mupirocin ointment  1 application Nasal BID  . sodium chloride flush  3 mL Intravenous Q12H  . tamsulosin  0.8 mg Oral QHS   Continuous Infusions: . sodium chloride 75 mL/hr at 04/04/17 1033  .  ampicillin-sulbactam (UNASYN) IV Stopped (04/04/17 1104)    Principal Problem:   Acute on chronic respiratory failure with hypoxia (HCC) Active Problems:   Hyperlipidemia   Pleural effusion transudative   Hypertension   Coronary artery disease   Hypercalcemia   Transaminitis   Aspiration pneumonia ([place])   Dyspnea   Encounter for palliative care   Goals of care, counseling/discussion    Time spent: 30 minutes   Barton Dubois  [facility] Pager [phone]. If 7PM-7AM, please contact night-coverage at [email or web address], password [facility] 04/04/2017, 4:05 PM  LOS: 3 days

## 2017-04-04 NOTE — Progress Notes (Addendum)
04/04/2017 patient Heart rate continue to be elevated at, per the [facility] MD Lopressor was change to 2.5 to 5. Patient was given another 2.5 at 1730 and a stat bmp and Magnesium was ordered. Per MD call if potassium is less than 4 and magnesium less than 2. [facility] RN.

## 2017-04-04 NOTE — Progress Notes (Signed)
04/04/2017 Central monitor called at 1610 it was reported patient went into Afib and Heart rate in the 130's. Rn assess patient  Heart rate  117 , 133 to 140's and saturation 88 to 89. Blood pressure 125/83 (92), and RR 32. Patient denies chest pain or being short of breath. Dr Dyann Kief was made aware patient was going into sinus tach and afib and heart rate was non sustain [facility].

## 2017-04-04 NOTE — Progress Notes (Signed)
[place] Progress Note Patient Name: Stephen Dean DOB: 1928-09-01 MRN: [id number]   Date of Service  04/04/2017  HPI/Events of Note  K+ = 3.8, Mg++ = 1.7 and Creatinine = 0.69.  eICU Interventions  Will replace k+ and Mg++.      Intervention Category Major Interventions: Electrolyte abnormality - evaluation and management  [name],[name] 04/04/2017, 8:14 PM

## 2017-04-04 NOTE — Progress Notes (Signed)
Stephen Progress Note   Patient Name: Stephen Dean       Date: 04/04/2017 DOB: [birth year]  Age: 83 y.o. MRN#: [id number] Attending Physician: Barton Dubois, MD Primary Care Physician: Seward Carol, MD Admit Date: 04/10/2017  Reason for Consultation/Follow-up: Establishing goals of care  Subjective:  patient is awake alert resting in bed Does not verbalize much  Family: wife, son, daughter, daughter in law are all at the bedside See discussions below:   Length of Stay: 3  Current Medications: Scheduled Meds:  . bisoprolol  10 mg Oral Stephen  . Chlorhexidine Gluconate Cloth  6 each Topical Q0600  . citalopram  20 mg Oral Stephen  . enoxaparin (LOVENOX) injection  40 mg Subcutaneous Stephen  . hyoscyamine  0.375 mg Oral Q12H  . ipratropium-albuterol  3 mL Nebulization Q6H  . methylPREDNISolone (SOLU-MEDROL) injection  40 mg Intravenous Q12H  . mupirocin ointment  1 application Nasal BID  . sodium chloride flush  3 mL Intravenous Q12H  . tamsulosin  0.8 mg Oral QHS    Continuous Infusions: . sodium chloride 20 mL/hr at 04/04/17 1641  . ampicillin-sulbactam (UNASYN) IV 1.5 g (04/04/17 1641)    PRN Meds: acetaminophen **OR** acetaminophen, dextromethorphan-guaiFENesin, ipratropium-albuterol, metoprolol tartrate, ondansetron **OR** ondansetron (ZOFRAN) IV, RESOURCE THICKENUP CLEAR  Physical Exam         Awake alert Follows commands S1 S2  Scattered rhonchi Abdomen soft Has R AKA  Vital Signs: BP 120/86   Pulse (!) 104   Temp 97.8 F (36.6 C) (Oral)   Resp (!) 23   Ht 5\' 7"  (1.702 m)   Wt 82.3 kg (181 lb 7 oz)   SpO2 (!) 89%   BMI 28.42 kg/m  SpO2: SpO2: (!) 89 % O2 Device: O2 Device: High Flow Nasal Cannula O2 Flow Rate: O2 Flow Rate (L/min): 12  L/min  Intake/output summary:  Intake/Output Summary (Last 24 hours) at 04/04/17 1649 Last data filed at 04/04/17 1500  Gross per 24 hour  Intake             2685 ml  Output             1025 ml  Net             1660 ml   LBM: Last BM Date: 04/03/17 Baseline Weight: Weight:  95.3 kg (210 lb) Most recent weight: Weight: 82.3 kg (181 lb 7 oz)       Palliative Assessment/Data:    Flowsheet Rows     Most Recent Value  Intake Tab  Referral Department  Hospitalist  Unit at Time of Referral  Cardiac/Telemetry Unit  Palliative Care Primary Diagnosis  Pulmonary  Palliative Care Type  New Palliative care  Reason for referral  Clarify Goals of Care  Date first seen by Palliative Care  04/02/17  Clinical Assessment  Palliative Performance Scale Score  30%  Pain Max last 24 hours  4  Pain Min Last 24 hours  3  Dyspnea Max Last 24 Hours  4  Dyspnea Min Last 24 hours  3  Psychosocial & Spiritual Assessment  Palliative Care Outcomes  Patient/Family meeting held?  Yes  Who was at the meeting?  patient, will call wife/children for family meeting after Pulmonary consultation.   Palliative Care Outcomes  Clarified goals of care      Patient Active Problem List   Diagnosis Date Noted  . Dyspnea   . Encounter for palliative care   . Goals of care, counseling/discussion   . Acute on chronic respiratory failure with hypoxia ([place]) 04/21/2017  . Hyperlipidemia 03/31/2017  . Pleural effusion transudative 04/09/2017  . Hypertension 04/13/2017  . Coronary artery disease 04/04/2017  . Hypercalcemia 03/31/2017  . Transaminitis 04/03/2017  . Aspiration pneumonia ([place]) 04/11/2017  . COPD with acute exacerbation ([place])   . Right bundle branch block 06/23/2014  . H/O abdominal aortic aneurysm repair 12/22/2013  . CAD (coronary artery disease) 04/02/2013  . Hyperlipidemia with target LDL less than 70 04/02/2013  . HTN (hypertension) 04/02/2013  . Cellulitis and abscess of leg 08/10/2012  .  Other postoperative infection 08/06/2012  . Popliteal aneurysm ([place]) 07/30/2012  . PVD (peripheral vascular disease) ([place]) 07/13/2012  . S/P AKA (above knee amputation) ([place]) 06/22/2012    Palliative Care Assessment & Plan   Patient Profile:    Assessment:  Advanced dementia, how ever family says he was able to be at home, was able to ambulate with walker at home, was able to eat by himself.   Now admitted with asp pna.  Also with pleural effusion, PCCM following, will likely need thoracentesis, depending on re accumulation of pleural effusion   Recommendations/Plan:  Family meeting;  Dementia trajectory, dysphagia, recurrent asp pna, high O2 requirements all discussed with family in detail. All questions answered. Goals/wishes and values important to patient and family discussed:  1. Continue current course  2. Family hopeful for stabilization/recovery from a pulm standpoint  3. Family does not think PEG tube would be in line with the patient's wishes, they are on board with careful oral feeding of mech soft diet with nectar thick as has been advised by SLP  4. Discussed with family about patient likely needing higher level of care than home setting at the time of d/c, this of course depends on the rest of his hospitalization.   We will continue to follow along and help guide decision making, based on his disease trajectory.     Code Status:    Code Status Orders        Start     Ordered   04/18/2017 1928  Do not attempt resuscitation (DNR)  Continuous    Question Answer Comment  In the event of cardiac or respiratory ARREST Do not call a "code blue"   In the event of cardiac or respiratory ARREST  Do not perform Intubation, CPR, defibrillation or ACLS   In the event of cardiac or respiratory ARREST Use medication by any route, position, wound care, and other measures to relive pain and suffering. May use oxygen, suction and manual treatment of airway obstruction as needed for  comfort.      04/10/2017 1927    Code Status History    Date Active Date Inactive Code Status Order ID Comments User Context   04/04/2017  2:05 PM 03/26/2017  7:27 PM DNR [id number]  Merrily Pew, MD ED   06/22/2012  4:13 PM 06/29/2012  2:26 PM Full Code [id number]  Einar Crow, RN Inpatient   06/21/2012 10:04 AM 06/22/2012  4:13 PM Full Code [id number]  Howie Ill, RN Inpatient   06/18/2012  5:37 PM 06/21/2012 10:04 AM Full Code [id number]  Karen Kays, RN Inpatient   06/17/2012  4:44 PM 06/18/2012  5:37 PM Full Code [id number]  Ruthine Dose Ngox, RN Inpatient       Prognosis:   guarded  Discharge Planning:  To Be Determined: patient came from home, I discussed about memory care unit vs SNF with family, this depends on his hospital course.   Care plan was discussed with  Patient's family.   Thank you for allowing the Palliative Medicine Team to assist in the care of this patient.   Time In: 1600 Time Out: 1635 Total Time 35 Prolonged Time Billed  no       Greater than 50%  of this time was spent counseling and coordinating care related to the above assessment and plan.  Loistine Chance, MD [phone]  Please contact Palliative Medicine Team phone at [phone] for questions and concerns.

## 2017-04-05 ENCOUNTER — Inpatient Hospital Stay (HOSPITAL_COMMUNITY): Payer: Medicare HMO

## 2017-04-05 DIAGNOSIS — I5023 Acute on chronic systolic (congestive) heart failure: Secondary | ICD-10-CM

## 2017-04-05 LAB — COMPREHENSIVE METABOLIC PANEL
ALT: 78 U/L — ABNORMAL HIGH (ref 17–63)
ANION GAP: 7 (ref 5–15)
AST: 53 U/L — AB (ref 15–41)
Albumin: 1.9 g/dL — ABNORMAL LOW (ref 3.5–5.0)
Alkaline Phosphatase: 127 U/L — ABNORMAL HIGH (ref 38–126)
BUN: 19 mg/dL (ref 6–20)
CHLORIDE: 101 mmol/L (ref 101–111)
CO2: 33 mmol/L — ABNORMAL HIGH (ref 22–32)
Calcium: 11 mg/dL — ABNORMAL HIGH (ref 8.9–10.3)
Creatinine, Ser: 0.78 mg/dL (ref 0.61–1.24)
GFR calc Af Amer: >60 mL/min (ref >60)
Glucose, Bld: 113 mg/dL — ABNORMAL HIGH (ref 65–99)
POTASSIUM: 4 mmol/L (ref 3.5–5.1)
Sodium: 141 mmol/L (ref 135–145)
Total Bilirubin: 0.7 mg/dL (ref 0.3–1.2)
Total Protein: 5.9 g/dL — ABNORMAL LOW (ref 6.5–8.1)

## 2017-04-05 LAB — PROCALCITONIN

## 2017-04-05 LAB — LACTIC ACID, PLASMA: Lactic Acid, Venous: 2.4 mmol/L (ref 0.5–1.9)

## 2017-04-05 MED ORDER — FUROSEMIDE 10 MG/ML IJ SOLN
20.0000 mg | Freq: Every day | INTRAMUSCULAR | Status: DC
  Administered 2017-04-05: 20 mg via INTRAVENOUS
  Filled 2017-04-05: qty 2

## 2017-04-05 MED ORDER — METHYLPREDNISOLONE SODIUM SUCC 40 MG IJ SOLR
20.0000 mg | Freq: Two times a day (BID) | INTRAMUSCULAR | Status: DC
  Administered 2017-04-05 – 2017-04-06 (×2): 20 mg via INTRAVENOUS
  Filled 2017-04-05 (×2): qty 1

## 2017-04-05 NOTE — Progress Notes (Signed)
Daily Progress Note   Patient Name: Stephen Dean       Date: 04/05/2017 DOB: 1929/03/01  Age: 83 y.o. MRN#: [id number] Attending Physician: Barton Dubois, MD Primary Care Physician: Seward Carol, MD Admit Date: 04/05/2017  Reason for Consultation/Follow-up: Establishing goals of care  Subjective:  patient is awake but confused resting in bed Does not verbalize much, see below  Family: wife, son, daughter, daughter in law are all at the bedside See discussions below:   Length of Stay: 4  Current Medications: Scheduled Meds:  . bisoprolol  10 mg Oral Daily  . Chlorhexidine Gluconate Cloth  6 each Topical Q0600  . citalopram  20 mg Oral Daily  . enoxaparin (LOVENOX) injection  40 mg Subcutaneous Daily  . hyoscyamine  0.375 mg Oral Q12H  . ipratropium-albuterol  3 mL Nebulization Q6H  . methylPREDNISolone (SOLU-MEDROL) injection  40 mg Intravenous Q12H  . mupirocin ointment  1 application Nasal BID  . sodium chloride flush  3 mL Intravenous Q12H  . tamsulosin  0.8 mg Oral QHS    Continuous Infusions: . sodium chloride 20 mL/hr at 04/05/17 0234  . ampicillin-sulbactam (UNASYN) IV Stopped (04/05/17 1139)    PRN Meds: acetaminophen **OR** acetaminophen, dextromethorphan-guaiFENesin, ipratropium-albuterol, metoprolol tartrate, ondansetron **OR** ondansetron (ZOFRAN) IV, RESOURCE THICKENUP CLEAR  Physical Exam         Awake confused Follows commands S1 S2  Tight diminished breath sounds Abdomen soft Has R AKA  Vital Signs: BP 115/83   Pulse 82   Temp 97.8 F (36.6 C) (Oral)   Resp (!) 32   Ht 5' 7"  (1.702 m)   Wt 83.1 kg (183 lb 3.2 oz)   SpO2 90%   BMI 28.69 kg/m  SpO2: SpO2: 90 % O2 Device: O2 Device: NRB O2 Flow Rate: O2 Flow Rate (L/min): 12 L/min (put pt  on non rebreather)  Intake/output summary:   Intake/Output Summary (Last 24 hours) at 04/05/17 1458 Last data filed at 04/05/17 1139  Gross per 24 hour  Intake          1837.58 ml  Output             2400 ml  Net          -562.42 ml   LBM: Last BM Date: 04/03/17 Baseline Weight: Weight: 95.3 kg (  210 lb) Most recent weight: Weight: 83.1 kg (183 lb 3.2 oz)       Palliative Assessment/Data:    Flowsheet Rows     Most Recent Value  Intake Tab  Referral Department  Hospitalist  Unit at Time of Referral  Cardiac/Telemetry Unit  Palliative Care Primary Diagnosis  Pulmonary  Palliative Care Type  New Palliative care  Reason for referral  Clarify Goals of Care  Date first seen by Palliative Care  04/02/17  Clinical Assessment  Palliative Performance Scale Score  30%  Pain Max last 24 hours  4  Pain Min Last 24 hours  3  Dyspnea Max Last 24 Hours  4  Dyspnea Min Last 24 hours  3  Psychosocial & Spiritual Assessment  Palliative Care Outcomes  Patient/Family meeting held?  Yes  Who was at the meeting?  patient, will call wife/children for family meeting after Pulmonary consultation.   Palliative Care Outcomes  Clarified goals of care      Patient Active Problem List   Diagnosis Date Noted  . Dyspnea   . Encounter for palliative care   . Goals of care, counseling/discussion   . Acute on chronic respiratory failure with hypoxia ([place]) 03/30/2017  . Hyperlipidemia 03/31/2017  . Pleural effusion transudative 04/09/2017  . Hypertension 04/17/2017  . Coronary artery disease 04/05/2017  . Hypercalcemia 03/29/2017  . Transaminitis 04/20/2017  . Aspiration pneumonia ([place]) 03/28/2017  . COPD with acute exacerbation ([place])   . Right bundle branch block 06/23/2014  . H/O abdominal aortic aneurysm repair 12/22/2013  . CAD (coronary artery disease) 04/02/2013  . Hyperlipidemia with target LDL less than 70 04/02/2013  . HTN (hypertension) 04/02/2013  . Cellulitis and abscess of leg  08/10/2012  . Other postoperative infection 08/06/2012  . Popliteal aneurysm ([place]) 07/30/2012  . PVD (peripheral vascular disease) ([place]) 07/13/2012  . S/P AKA (above knee amputation) ([place]) 06/22/2012    Palliative Care Assessment & Plan   Patient Profile:    Assessment:  Advanced dementia, how ever family says he was able to be at home, was able to ambulate with walker at home, was able to eat by himself.   Now admitted with asp pna.  Also with pleural effusion, PCCM following, will likely need thoracentesis, depending on re accumulation of pleural effusion.  CXR just done, await results. On NRB mask now. Awake, confused. Denies dyspnea but is using neck and upper abd muscles of resp.    Recommendations/Plan:  Family meeting;  I met again with patient, wife, son, daughter in law and daughter. They are aware of the patient's tenuous resp status. They remain hopeful that he will improve. they are awaiting recently done CXR results/further discussions with primary service.   We will continue to follow his hospital course. Goals are not comfort-only at this point.  Patient has declining oral intake, took only few bites so far today. Discussed with family about this.   We will continue to follow along and help guide decision making, based on his disease trajectory.     Code Status:    Code Status Orders        Start     Ordered   03/31/2017 1928  Do not attempt resuscitation (DNR)  Continuous    Question Answer Comment  In the event of cardiac or respiratory ARREST Do not call a "code blue"   In the event of cardiac or respiratory ARREST Do not perform Intubation, CPR, defibrillation or ACLS   In the event  of cardiac or respiratory ARREST Use medication by any route, position, wound care, and other measures to relive pain and suffering. May use oxygen, suction and manual treatment of airway obstruction as needed for comfort.      04/11/2017 1927    Code Status History    Date  Active Date Inactive Code Status Order ID Comments User Context   03/27/2017  2:05 PM 03/26/2017  7:27 PM DNR [id number]  Merrily Pew, MD ED   06/22/2012  4:13 PM 06/29/2012  2:26 PM Full Code [id number]  Einar Crow, RN Inpatient   06/21/2012 10:04 AM 06/22/2012  4:13 PM Full Code [id number]  Howie Ill, RN Inpatient   06/18/2012  5:37 PM 06/21/2012 10:04 AM Full Code [id number]  Karen Kays, RN Inpatient   06/17/2012  4:44 PM 06/18/2012  5:37 PM Full Code [id number]  Ruthine Dose Ngox, RN Inpatient       Prognosis:   guarded  Discharge Planning:  To Be Determined: patient came from home, I discussed about memory care unit vs SNF with family, this depends on his hospital course.   Care plan was discussed with  Patient's family.   Thank you for allowing the Palliative Medicine Team to assist in the care of this patient.   Time In: 1430 Time Out: 1455 Total Time 25 Prolonged Time Billed  no       Greater than 50%  of this time was spent counseling and coordinating care related to the above assessment and plan.  Loistine Chance, MD [phone]  Please contact Palliative Medicine Team phone at [phone] for questions and concerns.

## 2017-04-05 NOTE — Progress Notes (Signed)
[facility] PROGRESS NOTE  Stephen Dean JOI:[id number] DOB: 15-Dec-1928 DOA: 04/21/2017 PCP: Seward Carol, MD  Interim summary and HPI 83 y.o. male with medical history significant for CAD with history of remote PTCA to RCA and LAD, PVD with right AKA, hypertension, HLD, COPD with chronic hypoxemia on 4 L oxygen at home. This patient has had several weeks of worsening hypoxemia with congestion and nonproductive cough. He has not had any fevers or chills. He has not had any lower extremity edema. His PCP has treated him with 2 courses of antibiotics  (? Avelox and other unknown medication ) without improvement in symptoms. Imaging in July revealed bilateral pleural effusions right greater than left. His pulmonologist subsequent ordered outpatient thoracentesis that was accomplished on 7/26 with 1.7 L of fluid removed with cytology subsequently consistent with exudative effusion and atypical cells. Unfortunately within 24 hours of the thoracentesis patient felt generalized weakness and hypotension and was taken to the ER where he was given 500 mL of fluid. Since that time he has undergone a CT of the chest without contrast on 8/6 this revealed bilateral pleural effusions with groundglass attenuation and interstitial thickening compatible with moderate congestive heart failure. He returns to the ER today with worsening hypoxemia with increased O2 requirements. Was sent over from the PCPs office noting that his O2 saturation was 86% on 6 L oxygen and improved to 95% on a nonrebreather 15 L. PCXR in the ER consistent with interstitial pulmonary edema superimposed on chronic lung disease with emphysema. He currently appears comfortable on a Venturi mask at 50% with sats between 90-94%. Of note the pulmonologist obtained history that patient has been choking and coughing with eating and personally reviewed the recent CT scans which demonstrated food debris in the esophagus extending the entire length of  the esophagus towards the upper airways. Which suggested aspiration process.   Assessment/Plan: Acute on chronic resp failure with hypoxia: appears to be secondary to continue aspiration/pneumnitis  -patient with extensive hx of emphysema and oxygen dependency at home -resp status worse today; back to NRB and tachypnea with minimal exertion.  -will continue antibiotics to cover anaerobes, given aspiration main culprit of his problems (on unasyn). -per speech therapy with continue dysphagia 3 diet and nectar thick liquids  -PCCM on board and will follow any further recommendations  -will continue home nebulizer/inhaler for his emphysema; will follow rec;s from pulmonary service  -will continue IV solumedrol, but will start tapering -will also add IV lasix for vascular congestion   2-Pleural effusion -2-D echo with mild systolic HF. Will follow daily weights and follow response. No frank crackles on exam. -last thoracentesis with atypical cells, lymphocytes and per criteria mainly transudate -concerns for underlying malignancy -per PCCM plan is to reassess reaccumulation on Monday and most liekly repeat thoracentesis    3-HTN -BP was soft on presentation; most likely due to use of home medications in setting of some dehydration  -BP has remained stable and rising now -will continue B-blocker and flomax; will also add lasix IV 20 mg daily  4-Hypercalcemia/FTT -some improvement with IVF's but still up -now that he is experiencing vascular congestion will start lasix, which would also help with hypercalcemia. -concerns of underlying malignancy brought up base on atypical cells on thoracentesis; will follow pulmonary service rec's.  5-HLD -will continue holding statins for now -see below, will follow LFT's trend   6-transaminitis   -continue holding statins for now -repeat CMET with mild elevation on AST/ALT -will follow LFT's  7-BPH -Will continue Flomax. -No signs of urinary  retention.    8-depression -Will continue Celexa.   9-hx of dementia: advanced -continue supportive care and constant reorientation   -discussed with family risk of ongoing aspiration with underlying dementia. -They have noticed continue intermittent episodes of coughing/gargling sounds in his throat.   10-lactic acidosis:L most likely from dehydration on admission and ongoing resp acidosis  -Improved with IVF's; now with vascular congestion and mild exacerbation of CHF -lactic acid is 2.4 (most likely driven by resp issue) -will follow trend   02-OVZCHYI systolic heart failure -mild exacerbation after IVF's and IV antibiotics. -CXR with vascular congestion -will start IV lasix -follow daily weights and strict intake/output. -Ejection fraction 45-50% -Unable to assess for wall motion abnormality given poor acoustic windows.   Code Status: DNR Family Communication: no family at bedside  Disposition Plan: will remain in stepdown for another 24 hours; patient DNR, but will like to use BIPAP if needed. Will follow speech therapy rec's for diet consistency and will continue antibiotics. Palliative care consulted. Will also follow rec's from pulmonary service.  Consultants:  PCCM  Palliative care   Procedures:  See below for x-ray reports   2-d echo Procedure narrative: Transthoracic echocardiography. Image   quality was poor. The study was technically difficult, as a   result of poor acoustic windows. Intravenous contrast (Definity)   was administered. - Left ventricle: No good apical windows and definity images with   contrast are not adequate. In some views heart rate is fast and   defficult to accurately estimate EF but visually appears to be   45-50% The cavity size was normal. Wall thickness was increased   in a pattern of moderate LVH. Systolic function was mildly   reduced. The estimated ejection fraction was in the range of 45%   to 50%. Regional wall motion  abnormalities cannot be excluded  Antibiotics:  Unasyn 8/8  HPI/Subjective: Afebrile, with increase in resp distress and oxygen demand. No CP.   Objective: Vitals:   04/05/17 1549 04/05/17 1600  BP:  128/65  Pulse:  76  Resp:  (!) 28  Temp:    SpO2: 93% 90%    Intake/Output Summary (Last 24 hours) at 04/05/17 1656 Last data filed at 04/05/17 1545  Gross per 24 hour  Intake          1892.58 ml  Output             2400 ml  Net          -507.42 ml   Filed Weights   04/16/2017 1158 04/04/17 0317 04/05/17 0246  Weight: 95.3 kg (210 lb) 82.3 kg (181 lb 7 oz) 83.1 kg (183 lb 3.2 oz)    Exam:   General: patient with increase resp distress today; requiring scalation to NRB mask; he is alert and able to follow commands. Pleasantly confused (but close to his baseline dementia). No CP, no abd pain, no nausea, no vomiting.   Cardiovascular: RRR, no rubs, no gallops, no JVD   Respiratory: increase in resp rate, patient using abd muscles and requiring NRB mask. Fine crackles appreciated on exam. Slight positive exp wheezing.  Abdomen: soft, NT, ND, positive BS  Musculoskeletal: trace edema on his LLE, no cyanosis or clubbing; right AKA on exam, no open ulcers on his stump.   Data Reviewed: Basic Metabolic Panel:  Recent Labs Lab 04/24/2017 1215 04/02/17 0333 04/03/17 0455 04/04/17 1839 04/05/17 0605  NA 139 141 143 138  141  K 4.6 3.6 3.9 3.8 4.0  CL 97* 102 105 103 101  CO2 33* 28 31 27  33*  GLUCOSE 124* 106* 131* 135* 113*  BUN 32* 26* 23* 20 19  CREATININE 1.10 0.80 0.76 0.69 0.78  CALCIUM 12.5* 11.8* 11.4* 11.0* 11.0*  MG  --   --   --  1.7  --    Liver Function Tests:  Recent Labs Lab 04/21/2017 1215 04/02/17 0333 04/03/17 0455 04/05/17 0605  AST 69* 50* 45* 53*  ALT 65* 64* 56 78*  ALKPHOS 135* 124 111 127*  BILITOT 1.5* 0.9 0.7 0.7  PROT 6.3* 6.2* 5.3* 5.9*  ALBUMIN 2.1* 2.0* 1.8* 1.9*    Recent Labs Lab 04/24/2017 1320  AMMONIA 17   CBC:  Recent  Labs Lab 04/15/2017 1215 04/02/17 0333 04/03/17 0455  WBC 15.3* 12.4* 11.0*  NEUTROABS 12.9*  --   --   HGB 14.4 13.0 12.3*  HCT 43.0 40.3 37.6*  MCV 93.9 94.8 94.0  PLT 320 249 237   Cardiac Enzymes:  Recent Labs Lab 03/25/2017 1215  TROPONINI <0.03   BNP (last 3 results)  Recent Labs  03/31/2017 1215  BNP 42.5   CBG: No results for input(s): GLUCAP in the last 168 hours.  Recent Results (from the past 240 hour(s))  Blood culture (routine x 2)     Status: None (Preliminary result)   Collection Time: 04/03/2017 12:14 PM  Result Value Ref Range Status   Specimen Description BLOOD LEFT ANTECUBITAL  Final   Special Requests   Final    BOTTLES DRAWN AEROBIC AND ANAEROBIC Blood Culture adequate volume   Culture NO GROWTH 4 DAYS  Final   Report Status PENDING  Incomplete  Blood culture (routine x 2)     Status: None (Preliminary result)   Collection Time: 04/19/2017 12:19 PM  Result Value Ref Range Status   Specimen Description BLOOD LEFT HAND  Final   Special Requests   Final    BOTTLES DRAWN AEROBIC AND ANAEROBIC Blood Culture results may not be optimal due to an inadequate volume of blood received in culture bottles   Culture NO GROWTH 4 DAYS  Final   Report Status PENDING  Incomplete  Urine Culture     Status: None   Collection Time: 03/25/2017  5:10 PM  Result Value Ref Range Status   Specimen Description URINE, RANDOM  Final   Special Requests NONE  Final   Culture NO GROWTH  Final   Report Status 04/04/2017 FINAL  Final  MRSA PCR Screening     Status: Abnormal   Collection Time: 04/02/17  8:13 AM  Result Value Ref Range Status   MRSA by PCR POSITIVE (A) NEGATIVE Final    Comment:        The GeneXpert MRSA Assay (FDA approved for NASAL specimens only), is one component of a comprehensive MRSA colonization surveillance program. It is not intended to diagnose MRSA infection nor to guide or monitor treatment for MRSA infections. RESULT CALLED TO, READ BACK BY AND  VERIFIED WITH: Ivin Poot RN 11:10 04/02/17 ([email or web address])      Studies: Dg Chest Port 1 View  Result Date: 04/05/2017 CLINICAL DATA:  Shortness of Breath EXAM: PORTABLE CHEST 1 VIEW COMPARISON:  04/02/2017 FINDINGS: Cardiac shadow is stable. Diffuse airspace opacities are identified as well as interstitial changes similar to that seen on the prior exam. No new focal abnormality is seen. No bony abnormality is noted. IMPRESSION: Diffuse changes in the  lungs bilaterally which may represent some acute on chronic edema. The overall appearance is stable from the prior exam. Electronically Signed   By: Inez Catalina M.D.   On: 04/05/2017 15:28    Scheduled Meds: . bisoprolol  10 mg Oral Daily  . Chlorhexidine Gluconate Cloth  6 each Topical Q0600  . citalopram  20 mg Oral Daily  . enoxaparin (LOVENOX) injection  40 mg Subcutaneous Daily  . furosemide  20 mg Intravenous Daily  . hyoscyamine  0.375 mg Oral Q12H  . ipratropium-albuterol  3 mL Nebulization Q6H  . methylPREDNISolone (SOLU-MEDROL) injection  40 mg Intravenous Q12H  . mupirocin ointment  1 application Nasal BID  . sodium chloride flush  3 mL Intravenous Q12H  . tamsulosin  0.8 mg Oral QHS   Continuous Infusions: . sodium chloride 20 mL/hr at 04/05/17 0234  . ampicillin-sulbactam (UNASYN) IV 1.5 g (04/05/17 1631)    Principal Problem:   Acute on chronic respiratory failure with hypoxia (HCC) Active Problems:   Hyperlipidemia   Pleural effusion transudative   Hypertension   Coronary artery disease   Hypercalcemia   Transaminitis   Aspiration pneumonia ([place])   Dyspnea   Encounter for palliative care   Goals of care, counseling/discussion    Time spent: 35 minutes   Barton Dubois  [facility] Pager [phone]. If 7PM-7AM, please contact night-coverage at [email or web address], password [facility] 04/05/2017, 4:56 PM  LOS: 4 days

## 2017-04-05 NOTE — Progress Notes (Signed)
CRITICAL VALUE ALERT  Critical Value:  Lactic acid 2.4  Date & Time Notied:  04/05/17 0735  Provider Notified: Dr. Dyann Kief  Orders Received/Actions taken: not at this time

## 2017-04-06 LAB — CULTURE, BLOOD (ROUTINE X 2)
CULTURE: NO GROWTH
Culture: NO GROWTH
Special Requests: ADEQUATE

## 2017-04-06 LAB — BASIC METABOLIC PANEL
Anion gap: 10 (ref 5–15)
BUN: 25 mg/dL — AB (ref 6–20)
CHLORIDE: 102 mmol/L (ref 101–111)
CO2: 31 mmol/L (ref 22–32)
CREATININE: 0.76 mg/dL (ref 0.61–1.24)
Calcium: 11.6 mg/dL — ABNORMAL HIGH (ref 8.9–10.3)
GFR calc Af Amer: >60 mL/min (ref >60)
GFR calc non Af Amer: >60 mL/min (ref >60)
Glucose, Bld: 110 mg/dL — ABNORMAL HIGH (ref 65–99)
POTASSIUM: 3.7 mmol/L (ref 3.5–5.1)
Sodium: 143 mmol/L (ref 135–145)

## 2017-04-06 LAB — LACTIC ACID, PLASMA
LACTIC ACID, VENOUS: 2.2 mmol/L — AB (ref 0.5–1.9)
LACTIC ACID, VENOUS: 2.4 mmol/L — AB (ref 0.5–1.9)

## 2017-04-06 LAB — GLUCOSE, CAPILLARY: Glucose-Capillary: 110 mg/dL — ABNORMAL HIGH (ref 65–99)

## 2017-04-06 LAB — MAGNESIUM: MAGNESIUM: 2 mg/dL (ref 1.7–2.4)

## 2017-04-06 MED ORDER — BIOTENE DRY MOUTH MT LIQD: 15.0000 mL | OROMUCOSAL | Status: DC | PRN

## 2017-04-06 MED ORDER — MORPHINE BOLUS VIA INFUSION
1.0000 mg | INTRAVENOUS | Status: DC | PRN
  Administered 2017-04-06 (×5): 1 mg via INTRAVENOUS
  Filled 2017-04-06: qty 1

## 2017-04-06 MED ORDER — METOPROLOL TARTRATE 5 MG/5ML IV SOLN
5.0000 mg | Freq: Once | INTRAVENOUS | Status: AC
  Administered 2017-04-06: 5 mg via INTRAVENOUS

## 2017-04-06 MED ORDER — POLYVINYL ALCOHOL 1.4 % OP SOLN
1.0000 [drp] | Freq: Four times a day (QID) | OPHTHALMIC | Status: DC | PRN
  Filled 2017-04-06: qty 15

## 2017-04-06 MED ORDER — MAGNESIUM SULFATE 2 GM/50ML IV SOLN
2.0000 g | Freq: Once | INTRAVENOUS | Status: AC
  Administered 2017-04-06: 2 g via INTRAVENOUS
  Filled 2017-04-06: qty 50

## 2017-04-06 MED ORDER — METOPROLOL TARTRATE 5 MG/5ML IV SOLN
5.0000 mg | Freq: Once | INTRAVENOUS | Status: AC
  Administered 2017-04-06: 5 mg via INTRAVENOUS
  Filled 2017-04-06: qty 5

## 2017-04-06 MED ORDER — LORAZEPAM 2 MG/ML PO CONC
1.0000 mg | ORAL | Status: DC | PRN
  Administered 2017-04-06: 1 mg via SUBLINGUAL

## 2017-04-06 MED ORDER — GLYCOPYRROLATE 1 MG PO TABS
1.0000 mg | ORAL_TABLET | ORAL | Status: DC | PRN
  Filled 2017-04-06: qty 1

## 2017-04-06 MED ORDER — HALOPERIDOL LACTATE 5 MG/ML IJ SOLN: 0.5000 mg | INTRAMUSCULAR | Status: DC | PRN

## 2017-04-06 MED ORDER — ONDANSETRON HCL 4 MG/2ML IJ SOLN: 4.0000 mg | Freq: Four times a day (QID) | INTRAMUSCULAR | Status: DC | PRN

## 2017-04-06 MED ORDER — HALOPERIDOL LACTATE 2 MG/ML PO CONC
0.5000 mg | ORAL | Status: DC | PRN
  Filled 2017-04-06: qty 0.3

## 2017-04-06 MED ORDER — SODIUM CHLORIDE 0.9 % IV BOLUS (SEPSIS)
250.0000 mL | Freq: Once | INTRAVENOUS | Status: AC
  Administered 2017-04-06: 250 mL via INTRAVENOUS

## 2017-04-06 MED ORDER — LORAZEPAM 1 MG PO TABS: 1.0000 mg | ORAL_TABLET | ORAL | Status: DC | PRN

## 2017-04-06 MED ORDER — SODIUM CHLORIDE 0.9 % IV SOLN
2.0000 mg/h | INTRAVENOUS | Status: DC
  Administered 2017-04-06: 2 mg/h via INTRAVENOUS
  Filled 2017-04-06: qty 10

## 2017-04-06 MED ORDER — GLYCOPYRROLATE 0.2 MG/ML IJ SOLN
0.2000 mg | INTRAMUSCULAR | Status: DC | PRN
Start: 2017-04-06 — End: 2017-04-07

## 2017-04-06 MED ORDER — ONDANSETRON 4 MG PO TBDP: 4.0000 mg | ORAL_TABLET | Freq: Four times a day (QID) | ORAL | Status: DC | PRN

## 2017-04-06 MED ORDER — LORAZEPAM 2 MG/ML IJ SOLN
1.0000 mg | INTRAMUSCULAR | Status: DC | PRN
  Filled 2017-04-06: qty 1

## 2017-04-06 MED ORDER — HALOPERIDOL 1 MG PO TABS: 0.5000 mg | ORAL_TABLET | ORAL | Status: DC | PRN

## 2017-04-06 MED ORDER — GLYCOPYRROLATE 0.2 MG/ML IJ SOLN: 0.2000 mg | INTRAMUSCULAR | Status: DC | PRN

## 2017-04-06 MED ORDER — IPRATROPIUM BROMIDE 0.02 % IN SOLN
0.5000 mg | Freq: Four times a day (QID) | RESPIRATORY_TRACT | Status: DC
  Administered 2017-04-06 (×3): 0.5 mg via RESPIRATORY_TRACT
  Filled 2017-04-06 (×3): qty 2.5

## 2017-04-06 MED ORDER — LEVALBUTEROL HCL 1.25 MG/0.5ML IN NEBU
1.2500 mg | INHALATION_SOLUTION | Freq: Four times a day (QID) | RESPIRATORY_TRACT | Status: DC
  Administered 2017-04-06 (×3): 1.25 mg via RESPIRATORY_TRACT
  Filled 2017-04-06 (×3): qty 0.5

## 2017-04-06 NOTE — Progress Notes (Signed)
Daily Progress Note   Patient Name: Stephen Dean       Date: 04/06/2017 DOB: [birth year]  Age: 83 y.o. MRN#: [id number] Attending Physician: Barton Dubois, MD Primary Care Physician: Seward Carol, MD Admit Date: 04/20/2017  Reason for Consultation/Follow-up: Establishing goals of care  Subjective:  patient continues to decline hemo dynamically. Patient is with tachycardia, patient is acutely dyspneic, is using accessory muscles of respiration, patient is in mild to moderate distress, he is non verbal, unsafe with POs at this point.  Family: wife, son, daughter, daughter in law and several other family members are all at the bedside See discussions below:   Length of Stay: 5  Current Medications: Scheduled Meds:  . bisoprolol  10 mg Oral Daily  . citalopram  20 mg Oral Daily  . enoxaparin (LOVENOX) injection  40 mg Subcutaneous Daily  . hyoscyamine  0.375 mg Oral Q12H  . ipratropium  0.5 mg Nebulization Q6H  . levalbuterol  1.25 mg Nebulization Q6H  . methylPREDNISolone (SOLU-MEDROL) injection  20 mg Intravenous Q12H  . mupirocin ointment  1 application Nasal BID  . sodium chloride flush  3 mL Intravenous Q12H  . tamsulosin  0.8 mg Oral QHS    Continuous Infusions: . sodium chloride 10 mL/hr at 04/06/17 0506  . ampicillin-sulbactam (UNASYN) IV Stopped (04/06/17 1159)    PRN Meds: acetaminophen **OR** acetaminophen, dextromethorphan-guaiFENesin, ondansetron **OR** ondansetron (ZOFRAN) IV, RESOURCE THICKENUP CLEAR  Physical Exam         Awake confused Does not follow commands S1 S2  Tight diminished breath sounds, shallow breathing.  Abdomen soft Has R AKA Using accessory muscles of respirations  Vital Signs: BP (!) 119/92 (BP Location: Right Arm)   Pulse 84   Temp  98.6 F (37 C) (Axillary)   Resp (!) 32   Ht _0  (1.702 m)   Wt 82.4 kg (181 lb 10.5 oz)   SpO2 91%   BMI 28.45 kg/m  SpO2: SpO2: 91 % O2 Device: O2 Device: NRB O2 Flow Rate: O2 Flow Rate (L/min): 15 L/min  Intake/output summary:   Intake/Output Summary (Last 24 hours) at 04/06/17 1244 Last data filed at 04/06/17 1000  Gross per 24 hour  Intake              652 ml  Output  1925 ml  Net            -1273 ml   LBM: Last BM Date: 04/03/17 Baseline Weight: Weight: 95.3 kg (210 lb) Most recent weight: Weight: 82.4 kg (181 lb 10.5 oz)       Palliative Assessment/Data:    Flowsheet Rows     Most Recent Value  Intake Tab  Referral Department  Hospitalist  Unit at Time of Referral  Cardiac/Telemetry Unit  Palliative Care Primary Diagnosis  Pulmonary  Palliative Care Type  New Palliative care  Reason for referral  Clarify Goals of Care  Date first seen by Palliative Care  04/02/17  Clinical Assessment  Palliative Performance Scale Score  30%  Pain Max last 24 hours  4  Pain Min Last 24 hours  3  Dyspnea Max Last 24 Hours  4  Dyspnea Min Last 24 hours  3  Psychosocial & Spiritual Assessment  Palliative Care Outcomes  Patient/Family meeting held?  Yes  Who was at the meeting?  patient, will call wife/children for family meeting after Pulmonary consultation.   Palliative Care Outcomes  Clarified goals of care      Patient Active Problem List   Diagnosis Date Noted  . Dyspnea   . Encounter for palliative care   . Goals of care, counseling/discussion   . Acute on chronic respiratory failure with hypoxia ([place]) 04/22/2017  . Hyperlipidemia 04/22/2017  . Pleural effusion transudative 04/24/2017  . Hypertension 04/21/2017  . Coronary artery disease 04/11/2017  . Hypercalcemia 04/21/2017  . Transaminitis 04/24/2017  . Aspiration pneumonia ([place]) 04/15/2017  . COPD with acute exacerbation ([place])   . Right bundle branch block 06/23/2014  . H/O abdominal aortic  aneurysm repair 12/22/2013  . CAD (coronary artery disease) 04/02/2013  . Hyperlipidemia with target LDL less than 70 04/02/2013  . HTN (hypertension) 04/02/2013  . Cellulitis and abscess of leg 08/10/2012  . Other postoperative infection 08/06/2012  . Popliteal aneurysm ([place]) 07/30/2012  . PVD (peripheral vascular disease) ([place]) 07/13/2012  . S/P AKA (above knee amputation) ([place]) 06/22/2012    Palliative Care Assessment & Plan   Patient Profile:    Assessment:  Advanced dementia, how ever family says he was able to be at home, was able to ambulate with walker at home, was able to eat by himself.   Now admitted with asp pna.  Also with pleural effusion, .  8/12: CXR just done, await results. On NRB mask now. Awake, confused. Denies dyspnea but is using neck and upper abd muscles of resp.   8/13: continues to decline, on NRB,high heart rate. Unstable.   Recommendations/Plan:  Family meeting;  I met again with patient, wife, son, daughter in law and daughter. Additionally, patient's sister and niece were also present. They are aware of the patient's tenuous resp status. Additionally, the patient is tachycardic, has resp difficulty. CXR results discussed. We talked about the fact that there are no quick fixes here, there are no easily reversible causes. The patient continues to decline inspite of aggressive medical interventions.   PLAN:  DNR DNI  Comfort care  Transfer to 6N  Morphine and other medications appropriate to comfort to be started and titrated  D/C all medications not directly contributing to comfort  May consider residential hospice if appropriate in the next 24-48 hours, based on  patient's hospital course. Family prefers hospice home in [place], [place] if  needed.        Code Status:    Code  Status Orders        Start     Ordered   04/21/2017 1928  Do not attempt resuscitation (DNR)  Continuous    Question Answer Comment  In the event of cardiac or  respiratory ARREST Do not call a "code blue"   In the event of cardiac or respiratory ARREST Do not perform Intubation, CPR, defibrillation or ACLS   In the event of cardiac or respiratory ARREST Use medication by any route, position, wound care, and other measures to relive pain and suffering. May use oxygen, suction and manual treatment of airway obstruction as needed for comfort.      04/13/2017 1927    Code Status History    Date Active Date Inactive Code Status Order ID Comments User Context   04/09/2017  2:05 PM 04/12/2017  7:27 PM DNR [id number]  Merrily Pew, MD ED   06/22/2012  4:13 PM 06/29/2012  2:26 PM Full Code [id number]  Einar Crow, RN Inpatient   06/21/2012 10:04 AM 06/22/2012  4:13 PM Full Code [id number]  Howie Ill, RN Inpatient   06/18/2012  5:37 PM 06/21/2012 10:04 AM Full Code [id number]  Karen Kays, RN Inpatient   06/17/2012  4:44 PM 06/18/2012  5:37 PM Full Code [id number]  Ruthine Dose Ngox, RN Inpatient       Prognosis:   hours to days   Discharge Planning: Anticipated hospital death versus residential hospice in the next 24-48 hours, family prefers hospice home [place], [place] if appropriate   Care plan was discussed with  Patient's family.   Thank you for allowing the Palliative Medicine Team to assist in the care of this patient.   Time In: 12 Time Out: 12.40 Total Time 40 Prolonged Time Billed  no       Greater than 50%  of this time was spent counseling and coordinating care related to the above assessment and plan.  Loistine Chance, MD [phone]  Please contact Palliative Medicine Team phone at [phone] for questions and concerns.

## 2017-04-06 NOTE — Plan of Care (Signed)
Problem: Health Behavior/Discharge Planning: Goal: Ability to manage health-related needs will improve Outcome: Progressing Discussed wioth patient about making him comfortable for the evening with some teach back displayed

## 2017-04-06 NOTE — Progress Notes (Signed)
RN informed RT that patient has been changed to comfort care. RN states that family is in room with patient and treatments may help his breathing/comfort. RT administered patient's 1400 medications. Pt is resting at this time.

## 2017-04-06 NOTE — Progress Notes (Signed)
Patient transferred via bed to 6N10. Patient responds to voice and vital signs taken prior to transfer with him being comfort care.

## 2017-04-06 NOTE — Progress Notes (Signed)
Wife called and informed her of patient decreasing status and transfer room to [place]

## 2017-04-06 NOTE — Progress Notes (Signed)
[facility] PROGRESS NOTE  Noah Lembke Markert YJE:[id number] DOB: [birth year] DOA: 04/06/2017 PCP: Seward Carol, MD  Interim summary and HPI 83 y.o. male with medical history significant for CAD with history of remote PTCA to RCA and LAD, PVD with right AKA, hypertension, HLD, COPD with chronic hypoxemia on 4 L oxygen at home. This patient has had several weeks of worsening hypoxemia with congestion and nonproductive cough. He has not had any fevers or chills. He has not had any lower extremity edema. His PCP has treated him with 2 courses of antibiotics  (? Avelox and other unknown medication ) without improvement in symptoms. Imaging in July revealed bilateral pleural effusions right greater than left. His pulmonologist subsequent ordered outpatient thoracentesis that was accomplished on 7/26 with 1.7 L of fluid removed with cytology subsequently consistent with exudative effusion and atypical cells. Unfortunately within 24 hours of the thoracentesis patient felt generalized weakness and hypotension and was taken to the ER where he was given 500 mL of fluid. Since that time he has undergone a CT of the chest without contrast on 8/6 this revealed bilateral pleural effusions with groundglass attenuation and interstitial thickening compatible with moderate congestive heart failure. He returns to the ER today with worsening hypoxemia with increased O2 requirements. Was sent over from the PCPs office noting that his O2 saturation was 86% on 6 L oxygen and improved to 95% on a nonrebreather 15 L. PCXR in the ER consistent with interstitial pulmonary edema superimposed on chronic lung disease with emphysema. He currently appears comfortable on a Venturi mask at 50% with sats between 90-94%. Of note the pulmonologist obtained history that patient has been choking and coughing with eating and personally reviewed the recent CT scans which demonstrated food debris in the esophagus extending the entire length of  the esophagus towards the upper airways. Which suggested aspiration process.   Assessment/Plan: Acute on chronic resp failure with hypoxia: appears to be secondary to continue aspiration/pneumnitis  -patient with extensive hx of emphysema and oxygen dependency at home -resp status has continue worsening and patient escalated to required non-rebreather mask. -after discussing once again with family and presenting further deterioration at [facility] meeting; decision was to focus on comfort care and stop abx's, steroids, diuretics, or any other intended to cure intervention. -patient started on end of life protocol and initiated on morphine drip to assist with air hunger and SOB. -anticipate hospital death. -appreciate palliative care help and assistance.  2-Pleural effusion -2-D echo with mild systolic HF.  -yesterday CXR demonstrated vascular congestion  -at this point plan is for full comfort care  -no further work up or invasive treatment will be pursuit   3-HTN -BP was soft on presentation; most likely due to use of home medications in setting of some dehydration  -BP soft now with use of metoprolol for uncontrolled A. Fib and use of pain meds -will focus on comfort care only.  4-Hypercalcemia/FTT -some improvement with IVF's but still elevated -concerns of underlying malignancy brought up base on atypical cells on thoracentesis; -patient is not a candidate for invasive work up and if positive malignancy, not a candidate for chemotherapy or any other treatment modality  -will focus on comfort care  5-HLD -wat this time will discontinue statins -focusing on comfort care only   6-transaminitis   -no further lab draws anticipated -focusing on comfort care only   7-BPH -No signs of urinary retention.   -with trouble taking PO's given resp distress -will stop flomax  8-depression -Will continue Celexa if capable of taking PO's  9-hx of dementia: advanced -continue supportive care  and now will focus on comfort care only  10-lactic acidosis:L most likely from dehydration on admission and ongoing resp acidosis  -resp status decompensated even further -CXR showing vascular congestion -no further IVF's will be pursuit and no more blood draws -plan is for comfort care only  33-[id number] systolic heart failure -mild exacerbation after IVF's and IV antibiotics. -CXR demonstrated vascular congestion -patient received 1 dose of IV lasix -after [place] discussion, plan is for full comfort care -will stop any further invasive treatment -Ejection fraction 45-50% -Unable to assess for wall motion abnormality given poor acoustic windows.   Code Status: DNR Family Communication: no family at bedside  Disposition Plan: will transfer to 6N and pursuit full comfort care. Morphine drip initiated by palliative care. Anticipate hospital death.   Consultants:  PCCM  Palliative care   Procedures:  See below for x-ray reports   2-d echo Procedure narrative: Transthoracic echocardiography. Image   quality was poor. The study was technically difficult, as a   result of poor acoustic windows. Intravenous contrast (Definity)   was administered. - Left ventricle: No good apical windows and definity images with   contrast are not adequate. In some views heart rate is fast and   defficult to accurately estimate EF but visually appears to be   45-50% The cavity size was normal. Wall thickness was increased   in a pattern of moderate LVH. Systolic function was mildly   reduced. The estimated ejection fraction was in the range of 45%   to 50%. Regional wall motion abnormalities cannot be excluded  Antibiotics:  Unasyn 8/8  HPI/Subjective: Afebrile. Patient with increase resp distress, requiring non-rebreather mask and using accessory muscles. Denies CP.   Objective: Vitals:   04/06/17 1300 04/06/17 1416  BP: 96/85   Pulse: (!) 146 (!) 142  Resp: (!) 30 (!) 30  Temp:    SpO2:  92% 94%    Intake/Output Summary (Last 24 hours) at 04/06/17 1510 Last data filed at 04/06/17 1500  Gross per 24 hour  Intake            654.6 ml  Output             1925 ml  Net          -1270.4 ml   Filed Weights   04/04/17 0317 04/05/17 0246 04/06/17 0339  Weight: 82.3 kg (181 lb 7 oz) 83.1 kg (183 lb 3.2 oz) 82.4 kg (181 lb 10.5 oz)    Exam:   General: patient denies CP and is afebrile. Continue having gargling coughing spells and had increase resp distress. Requiring now non-rebreather mask and looking uncomfortable.   Cardiovascular: irregular, irregular; no rubs or gallops   Respiratory: tachypneic, increase rhonchi and fair air movement; using accessory muscles.   Abdomen: soft, NT, ND, positive BS, no guarding   Musculoskeletal: trace edema on LLE, right AKA; no open wounds.    Data Reviewed: Basic Metabolic Panel:  Recent Labs Lab 04/02/17 0333 04/03/17 0455 04/04/17 1839 04/05/17 0605 04/06/17 0405  NA 141 143 138 141 143  K 3.6 3.9 3.8 4.0 3.7  CL 102 105 103 101 102  CO2 28 31 27  33* 31  GLUCOSE 106* 131* 135* 113* 110*  BUN 26* 23* 20 19 25*  CREATININE 0.80 0.76 0.69 0.78 0.76  CALCIUM 11.8* 11.4* 11.0* 11.0* 11.6*  MG  --   --  1.7  --  2.0   Liver Function Tests:  Recent Labs Lab 04/21/2017 1215 04/02/17 0333 04/03/17 0455 04/05/17 0605  AST 69* 50* 45* 53*  ALT 65* 64* 56 78*  ALKPHOS 135* 124 111 127*  BILITOT 1.5* 0.9 0.7 0.7  PROT 6.3* 6.2* 5.3* 5.9*  ALBUMIN 2.1* 2.0* 1.8* 1.9*    Recent Labs Lab 04/02/2017 1320  AMMONIA 17   CBC:  Recent Labs Lab 04/19/2017 1215 04/02/17 0333 04/03/17 0455  WBC 15.3* 12.4* 11.0*  NEUTROABS 12.9*  --   --   HGB 14.4 13.0 12.3*  HCT 43.0 40.3 37.6*  MCV 93.9 94.8 94.0  PLT 320 249 237   Cardiac Enzymes:  Recent Labs Lab 04/04/2017 1215  TROPONINI <0.03   BNP (last 3 results)  Recent Labs  03/28/2017 1215  BNP 42.5   CBG:  Recent Labs Lab 04/06/17 0617  GLUCAP 110*     Recent Results (from the past 240 hour(s))  Blood culture (routine x 2)     Status: None (Preliminary result)   Collection Time: 04/03/2017 12:14 PM  Result Value Ref Range Status   Specimen Description BLOOD LEFT ANTECUBITAL  Final   Special Requests   Final    BOTTLES DRAWN AEROBIC AND ANAEROBIC Blood Culture adequate volume   Culture NO GROWTH 4 DAYS  Final   Report Status PENDING  Incomplete  Blood culture (routine x 2)     Status: None (Preliminary result)   Collection Time: 04/22/2017 12:19 PM  Result Value Ref Range Status   Specimen Description BLOOD LEFT HAND  Final   Special Requests   Final    BOTTLES DRAWN AEROBIC AND ANAEROBIC Blood Culture results may not be optimal due to an inadequate volume of blood received in culture bottles   Culture NO GROWTH 4 DAYS  Final   Report Status PENDING  Incomplete  Urine Culture     Status: None   Collection Time: 04/10/2017  5:10 PM  Result Value Ref Range Status   Specimen Description URINE, RANDOM  Final   Special Requests NONE  Final   Culture NO GROWTH  Final   Report Status 04/04/2017 FINAL  Final  MRSA PCR Screening     Status: Abnormal   Collection Time: 04/02/17  8:13 AM  Result Value Ref Range Status   MRSA by PCR POSITIVE (A) NEGATIVE Final    Comment:        The GeneXpert MRSA Assay (FDA approved for NASAL specimens only), is one component of a comprehensive MRSA colonization surveillance program. It is not intended to diagnose MRSA infection nor to guide or monitor treatment for MRSA infections. RESULT CALLED TO, READ BACK BY AND VERIFIED WITH: Ivin Poot RN 11:10 04/02/17 ([email or web address])      Studies: Dg Chest Port 1 View  Result Date: 04/05/2017 CLINICAL DATA:  Shortness of Breath EXAM: PORTABLE CHEST 1 VIEW COMPARISON:  04/02/2017 FINDINGS: Cardiac shadow is stable. Diffuse airspace opacities are identified as well as interstitial changes similar to that seen on the prior exam. No new focal abnormality is seen. No  bony abnormality is noted. IMPRESSION: Diffuse changes in the lungs bilaterally which may represent some acute on chronic edema. The overall appearance is stable from the prior exam. Electronically Signed   By: Inez Catalina M.D.   On: 04/05/2017 15:28    Scheduled Meds: . ipratropium  0.5 mg Nebulization Q6H  . levalbuterol  1.25 mg Nebulization Q6H   Continuous Infusions: . sodium  chloride 10 mL/hr at 04/06/17 0506  . morphine 2 mg/hr (04/06/17 1342)    Principal Problem:   Acute on chronic respiratory failure with hypoxia (HCC) Active Problems:   Hyperlipidemia   Pleural effusion transudative   Hypertension   Coronary artery disease   Hypercalcemia   Transaminitis   Aspiration pneumonia ([place])   Dyspnea   Palliative care encounter   Goals of care, counseling/discussion    Time spent: 30 minutes   Barton Dubois  [facility] Pager [phone]. If 7PM-7AM, please contact night-coverage at [email or web address], password [facility] 04/06/2017, 3:10 PM  LOS: 5 days

## 2017-04-06 NOTE — Telephone Encounter (Signed)
Spoke with pt's wife. She has already picked up this medication for the pt. Nothing further was needed.

## 2017-04-06 NOTE — Progress Notes (Signed)
eLink Physician-Brief Progress Note Patient Name: Stephen Dean DOB: 1929/06/01 MRN: [id number]   Date of Service  04/06/2017  HPI/Events of Note  Af-rvr, Asymptomatic RN feels dry , was being diuresed with lasix  5mg  lopressor given  eICU Interventions  NS 250 bolus Consider cardizen gtt otherwise     Intervention Category Intermediate Interventions: Arrhythmia - evaluation and management  Gottfried Standish V. 04/06/2017, 6:26 AM

## 2017-04-06 NOTE — Progress Notes (Signed)
PMT no charge note:  Received call from bedside RN about the patient being unstable overnight, with high heart rate, with high O2 requirements, not as awake/alert as on 04-05-17.   Chart reviewed.   Call placed and discussed with wife. She states, " I feared it was coming to this.' I have re discussed DNR DNI with her, she agrees that this was chosen by the patient himself when he was able to make his own decisions.   Wife and the rest of the family to come into the hospital later this morning. They wish to seek input from hospital medicine and pulmonary service before considering comfort measures/hospice care.   I will follow up with them when they arrive at the bedside later today.   25 minutes spent.   Loistine Chance MD [phone]  Palliative Medicine Team.

## 2017-04-06 NOTE — Progress Notes (Signed)
Md paged regarding pt HR in upper 140's. Orders for 5mg  lopressor given.

## 2017-04-06 NOTE — Care Management Important Message (Signed)
Important Message  Patient Details  Name: Stephen Dean MRN: [id number] Date of Birth: [birth year]   Medicare Important Message Given:  Yes    Zenon Mayo, RN 04/06/2017, [place] Message  Patient Details  Name: Stephen Dean MRN: [id number] Date of Birth: Sep 09, 1928   Medicare Important Message Given:  Yes    Zenon Mayo, RN 04/06/2017, 4:12 PM

## 2017-04-06 NOTE — Plan of Care (Signed)
Problem: Pain Managment: Goal: General experience of comfort will improve Outcome: Progressing Discussed with patient importance of wearing his non-re-breather mask and plan of care for the evening with some teach back displayed

## 2017-04-06 NOTE — Progress Notes (Signed)
SLP Cancellation Note  Patient Details Name: Stephen Dean MRN: [id number] DOB: [birth year]   Cancelled treatment:        Pt on nonrebreather, relatively stable per RN, but they held breakfast tray this am. Will hold off on therapy until meeting with family complete. SLP and MD have communicated high risk of aspiration due to dysphagia combined with COPD. Pt has been struggling to follow compensatory strategies with modified diet, further increasing risk. Will f/u for needs.    Kylena Mole, Katherene Ponto 04/06/2017, 10:32 AM

## 2017-04-07 ENCOUNTER — Telehealth: Payer: Self-pay | Admitting: Pulmonary Disease

## 2017-04-07 DIAGNOSIS — R0602 Shortness of breath: Secondary | ICD-10-CM

## 2017-04-07 MED ORDER — LEVALBUTEROL HCL 1.25 MG/0.5ML IN NEBU
1.2500 mg | INHALATION_SOLUTION | Freq: Three times a day (TID) | RESPIRATORY_TRACT | Status: DC
  Filled 2017-04-07: qty 0.5

## 2017-04-07 MED ORDER — IPRATROPIUM BROMIDE 0.02 % IN SOLN
0.5000 mg | Freq: Three times a day (TID) | RESPIRATORY_TRACT | Status: DC
  Filled 2017-04-07: qty 2.5

## 2017-04-14 NOTE — Telephone Encounter (Signed)
Will forward to PM to make him aware of pt passing.  Will close this message at this time.

## 2017-04-25 NOTE — Telephone Encounter (Signed)
Spoke with patient's wife. Patient was asking about a call she received about a PA. I read what the previous message stated. She verbalized understanding and also wanted to let Dr. Vaughan Browner know that Mr. Piedra passed away this morning. I advised her that I would let PM know and expressed my condolences.

## 2017-04-25 NOTE — Progress Notes (Signed)
Patient noted with no respiration and pulse, skin cold to touch, pt. Expired with  time of death 0320 pronounced by 2 RNs. MD on call  and spouse Romie Minus [facility]) notified. Referral made to [facility]. Family at bedside. Emotional support given to family.

## 2017-04-25 NOTE — Discharge Summary (Signed)
Death Summary  Stephen Dean ZOX:[id number] DOB: 11-13-1928 DOA: April 26, 2017  PCP: Seward Carol, MD PCP/Office notified: physician notified through [facility] and office also contacted.  Admit date: 04/26/2017 Date of Death: 2017-05-02  Final Diagnoses:  Principal Problem:   Acute on chronic respiratory failure with hypoxia (HCC) Active Problems:   Hyperlipidemia   Pleural effusion transudative   Hypertension   Coronary artery disease   Hypercalcemia   Transaminitis   Aspiration pneumonia (HCC)   Dyspnea   Palliative care encounter   Goals of care, counseling/discussion   History of present illness:  83 y.o.malewith medical history significant for CAD with history of remote PTCA to RCA and LAD, PVD with right AKA,hypertension, HLD, COPD with chronic hypoxemia on 4 L oxygen at home.This patient has had several weeks of worsening hypoxemia with congestion and nonproductive cough. He has not had any fevers or chills. He has not had any lower extremity edema. His PCP has treated him with 2 courses of antibiotics (?Avelox and other unknown medication ) without improvement in symptoms. Imaging in July revealed bilateral pleural effusions right greater than left. His pulmonologist subsequent ordered outpatient thoracentesis that was accomplished on 7/26 with 1.7 L of fluid removed with cytology subsequently consistent with exudative effusion and atypical cells. Unfortunately within 24 hours of the thoracentesis patient felt generalized weakness and hypotension and was taken to the ER where he was given 500 mL of fluid. Since that time he has undergone a CT of the chest without contrast on 8/6 this revealed bilateral pleural effusions with groundglass attenuation and interstitial thickening compatible with moderate congestive heart failure. He returns to the ER today with worsening hypoxemia with increased O2 requirements. Was sent over from the PCPs office noting that his O2 saturation was 86% on 6  L oxygen and improved to 95% on a nonrebreather 15 L. PCXRin the ER consistent with interstitial pulmonary edema superimposed on chronic lung disease with emphysema. He currently appears comfortable on a Venturi mask at 50% with sats between 90-94%. Of note the pulmonologist obtained history that patient has been choking and coughing with eating and personally reviewed the recent CT scans which demonstrated food debris in the esophagus extending the entire length of the esophagus towards the upper airways. Which suggested aspiration process.   Hospital Course:  Acute on chronic resp failure with hypoxia: appears to be secondary to continue aspiration/pneumnitis  -patient with extensive hx of emphysema and oxygen dependency at home -resp status has continue worsening and patient escalated to required non-rebreather mask. -after discussing once again with family and presenting further deterioration at [facility] meeting; decision was to stop abx's, steroids, diuretics, or any other intended to cure intervention. -patient's care was focused on comfort care only  -patient started on end of life protocol and initiated on morphine drip to assist with air hunger and SOB. -as anticipated patient expired in the hospital on 05/03/23 at 03:20 am. -palliative care help and assistance was really appreciated.  2-Pleural effusion -2-D echo with mild systolic HF.  -8/11 CXR demonstrated vascular congestion; but no major effusion  -no further work up or invasive treatment will be pursuit  -patient's care was focused on comfort care only   3-HTN -BP was soft on presentation; most likely due to use of home medications in setting of some dehydration  -BP soft now with use of metoprolol for uncontrolled A. Fib and use of pain meds -patient's care was focused on comfort care only   4-Hypercalcemia/FTT -there where some  improvement in his Calcium level with IVF's but was still elevated -concerns of underlying malignancy  brought up base on atypical cells on thoracentesis; -patient is not a candidate for invasive work up and if positive malignancy, not a candidate for chemotherapy or any other treatment modality  -patient's care was focused on comfort care only  -patient expired on 8/14 at 03:20 am  5-HLD -all meds non intended for comfort measures were discontinued  -patient's care was focused on comfort care only   6-transaminitis   -no further lab draws anticipated -patient transitioned to full comfort care on 8/13; no more blood draws anticipated. -patient expired on 8/14 at 03:20 am    7-BPH -No signs of urinary retention.   -with trouble taking PO's given resp distress and use of breathing mask -flomax was discontinued   8-depression -after decision for comfort care made, this medication was kept on the list only if patient  capable of taking PO's  9-hx of dementia: advanced -stable overall; answering simple questions and able to follow simple commands. -poor insight overall. -was kept comfortable.   10-lactic acidosis: most likely from dehydration on admission and ongoing resp acidosis  -resp status decompensated even further and CXR showing vascular congestion (limiting fluid resuscitation) -after further [place] discussion patient transition to full comfort care. No further IVF's or diuretics given and no more blood draws -patient staretd on morphine drip for air hunger and resp distress.   15-VVOHYWV systolic heart failure -mild exacerbation after IVF's and IV antibiotics given. -CXR demonstrated vascular congestion on 04/05/17 -Echo showed Ejection fraction 45-50% -patient received IV lasix; w/o much improvement on his breathing despite good urine output. -after [facility] discussion, plan was decided for full comfort care -every invasive treatment was discontinued and patient started on morphine drip -he expired on 8/14 at 03:20 am   Time: 25 minutes  Signed:  Barton Dubois  [facility] 2017-05-07, 8:15 AM

## 2017-04-25 DEATH — deceased

## 2017-05-12 ENCOUNTER — Ambulatory Visit: Payer: Medicare HMO | Admitting: Pulmonary Disease

## 2017-05-25 ENCOUNTER — Ambulatory Visit: Payer: Medicare HMO | Admitting: Pulmonary Disease

## 2018-02-14 IMAGING — DX DG CHEST 2V
2 series · 2 of 2 positions shown · non-contrast
Comparison: 03/19/2017 .  03/05/2017.  08/09/2013 .

CLINICAL DATA: Follow-up pleural effusion.  Cough and congestion.

EXAM:
CHEST  2 VIEW

[chest pa]
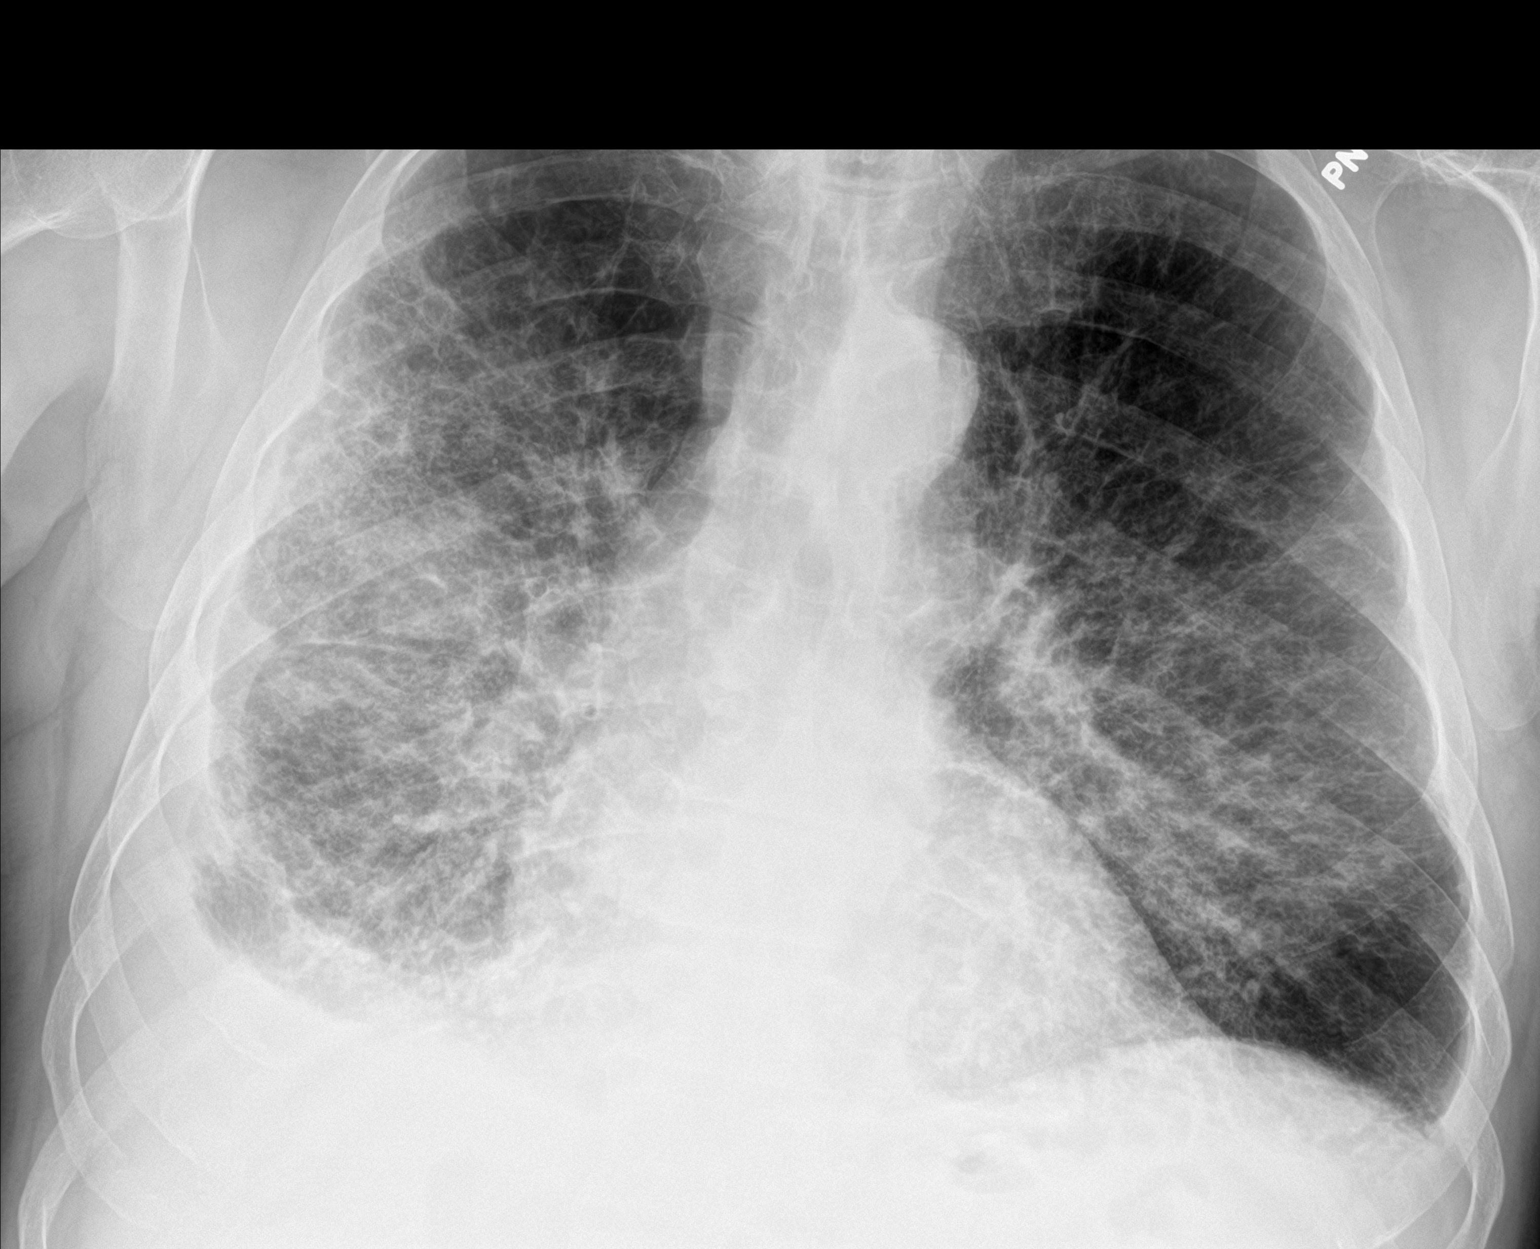

[chest lat]
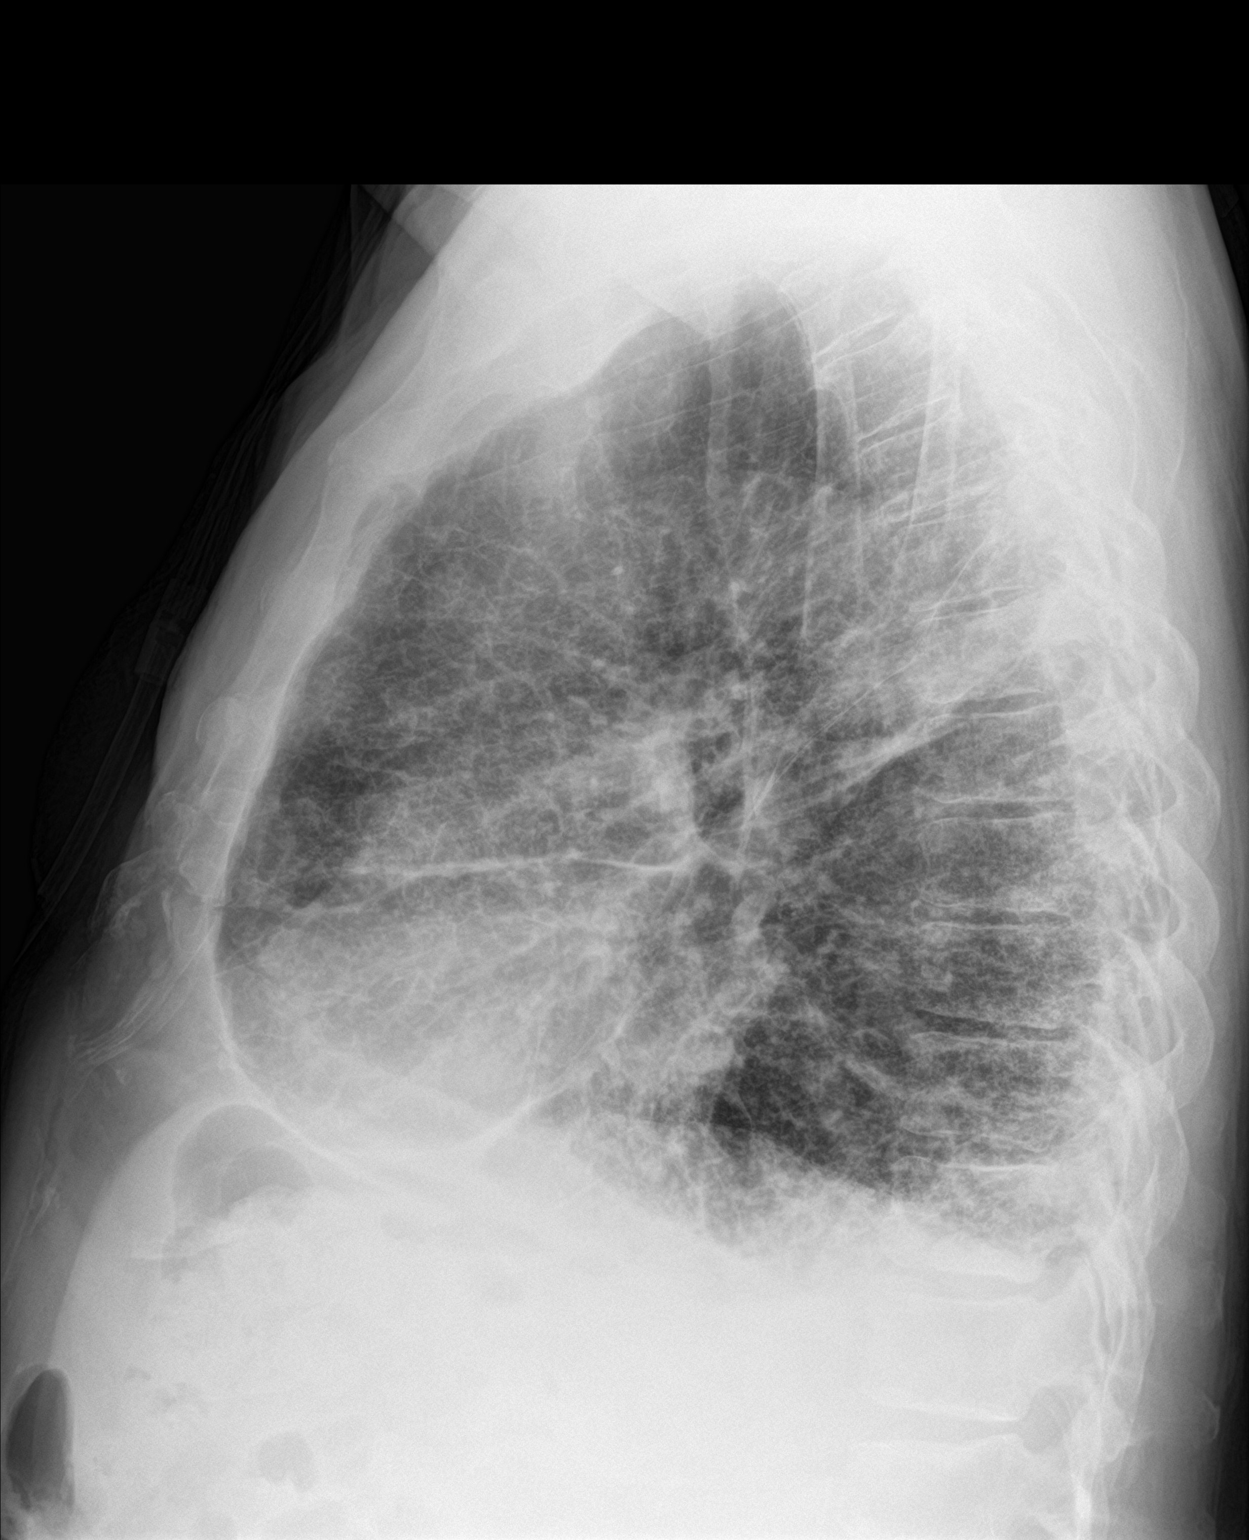

[2 of 2 positions shown; findings below may reference images not displayed]

FINDINGS: Cardiomegaly with increased bilateral pulmonary interstitial
prominence. Bilateral pleural effusions, right side greater than
left. Findings consistent with congestive heart failure. Bilateral
pneumonitis cannot be excluded. COPD. T
IMPRESSION: 1. Findings consistent with worsening congestive heart failure with
worsening pulmonary interstitial edema. Bilateral pleural effusions,
right side greater than left. Pleural effusions are unchanged.

2. COPD .

## 2018-02-23 IMAGING — DX DG CHEST 1V PORT
2 series · 2 of 2 positions shown · non-contrast
Comparison: Chest radiograph performed 04/01/2017

CLINICAL DATA: Acute on chronic respiratory failure, with hypoxia.
Initial encounter.

EXAM:
PORTABLE CHEST 1 VIEW

[chest ap (1 of 2)]
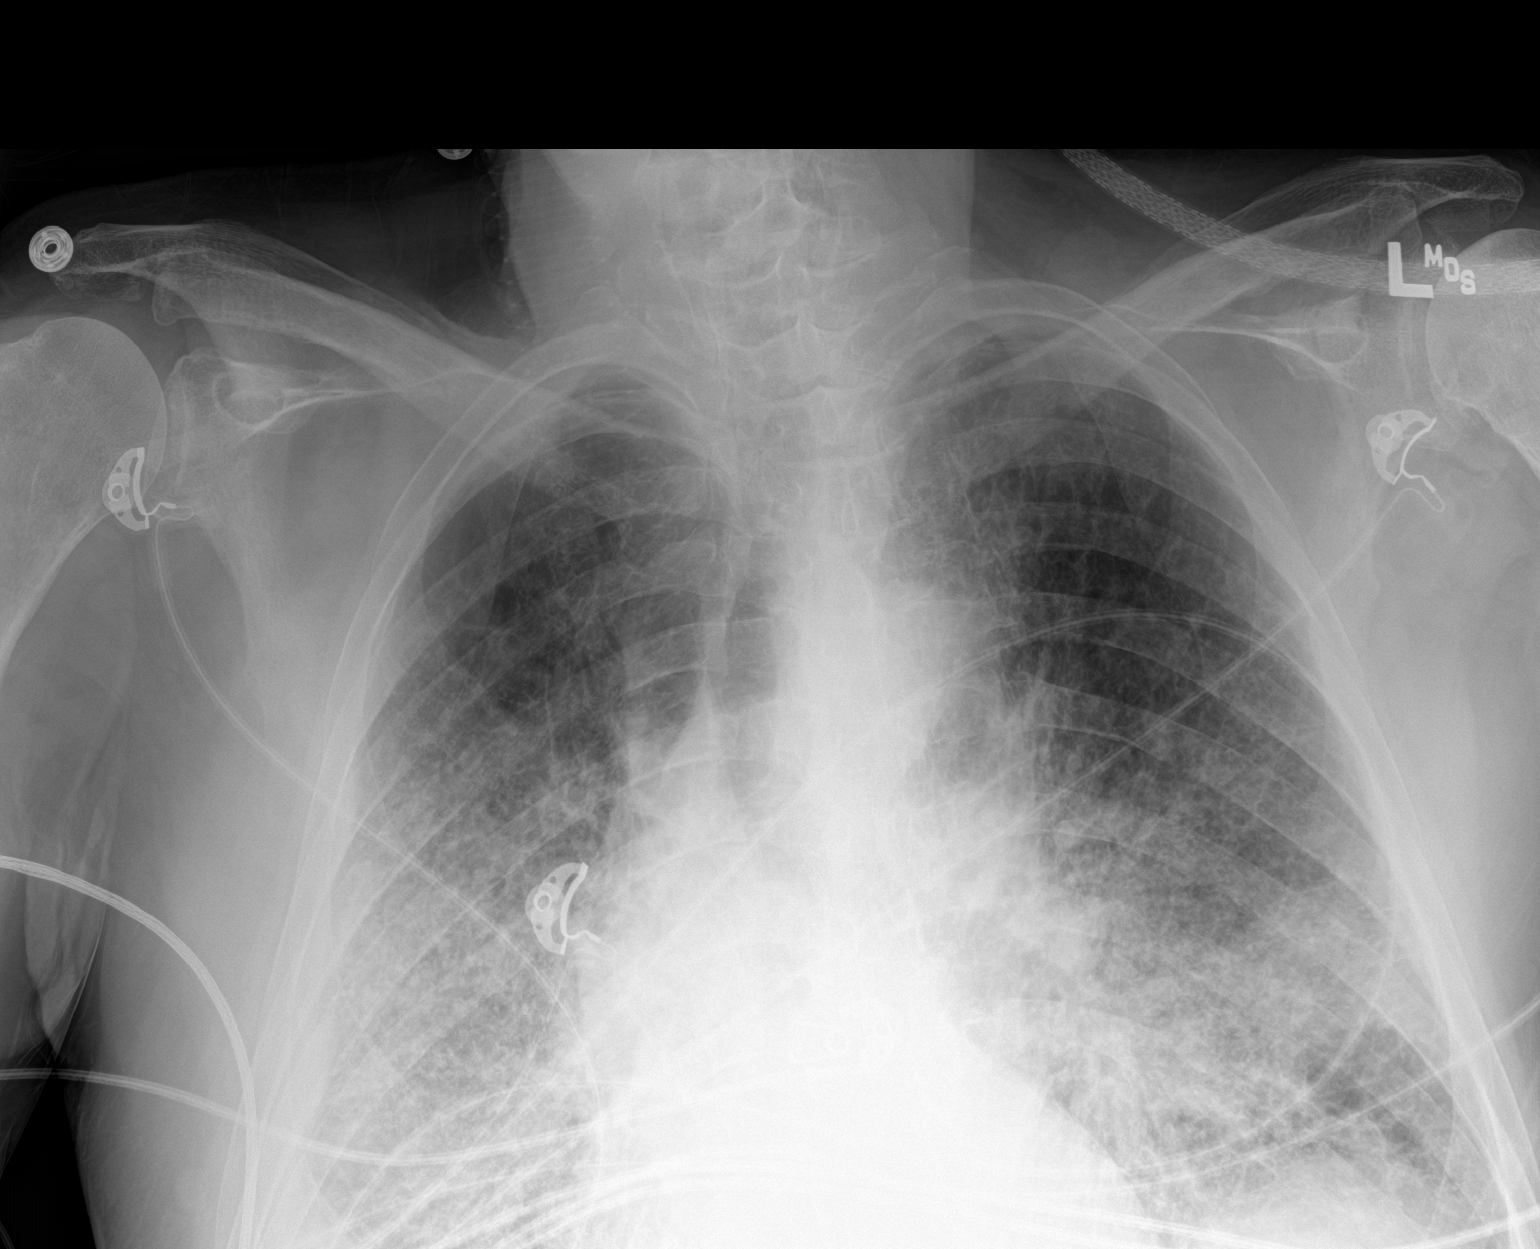

[chest ap (2 of 2)]
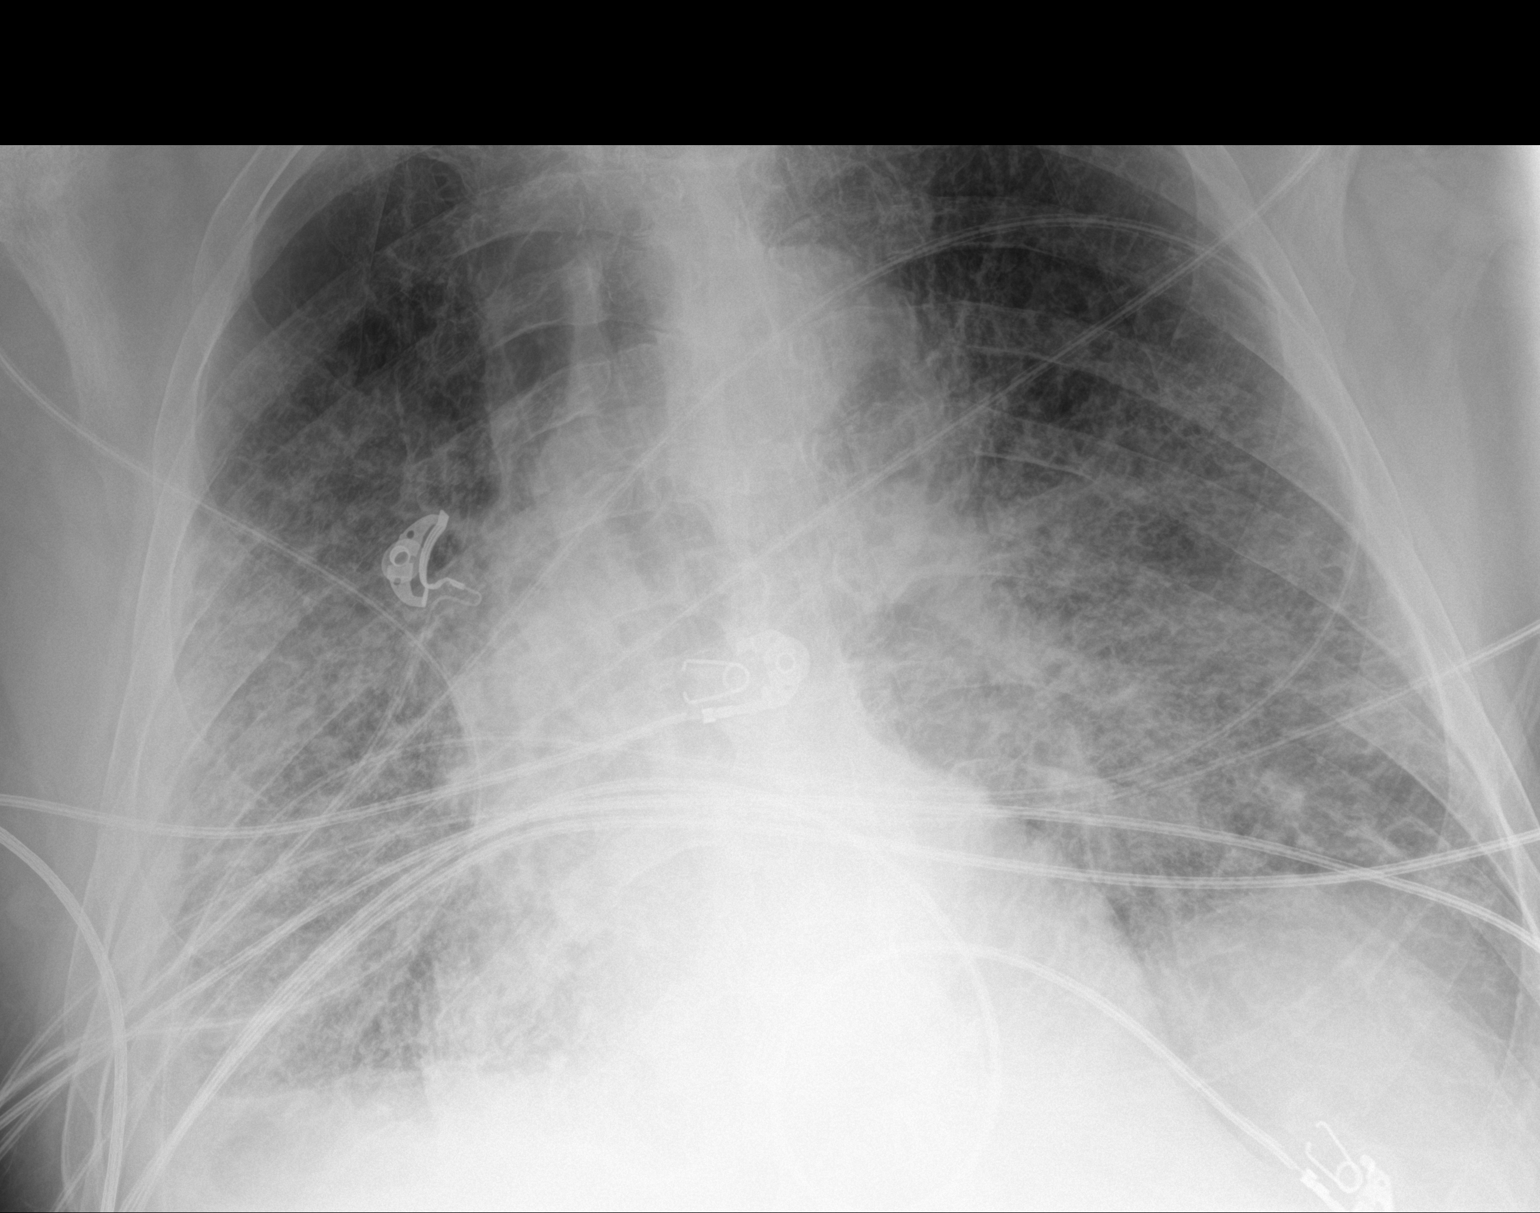

[2 of 2 positions shown; findings below may reference images not displayed]

FINDINGS: The lungs are well-aerated. Vascular congestion is noted. Diffuse
bilateral airspace opacification raises concern for pulmonary edema,
superimposed on chronic lung changes. A small right pleural effusion
is noted. No pneumothorax is seen.

The cardiomediastinal silhouette is normal in size. No acute osseous
abnormalities are seen.
IMPRESSION: Vascular congestion. Diffuse bilateral airspace opacification is
similar to the recent prior study and concerning for pulmonary
edema, superimposed on chronic lung changes. Small right pleural
effusion noted.

## 2018-02-26 IMAGING — DX DG CHEST 1V PORT
1 series · 1 of 1 positions shown · non-contrast
Comparison: 04/02/2017

CLINICAL DATA: Shortness of Breath

EXAM:
PORTABLE CHEST 1 VIEW

[chest ap]
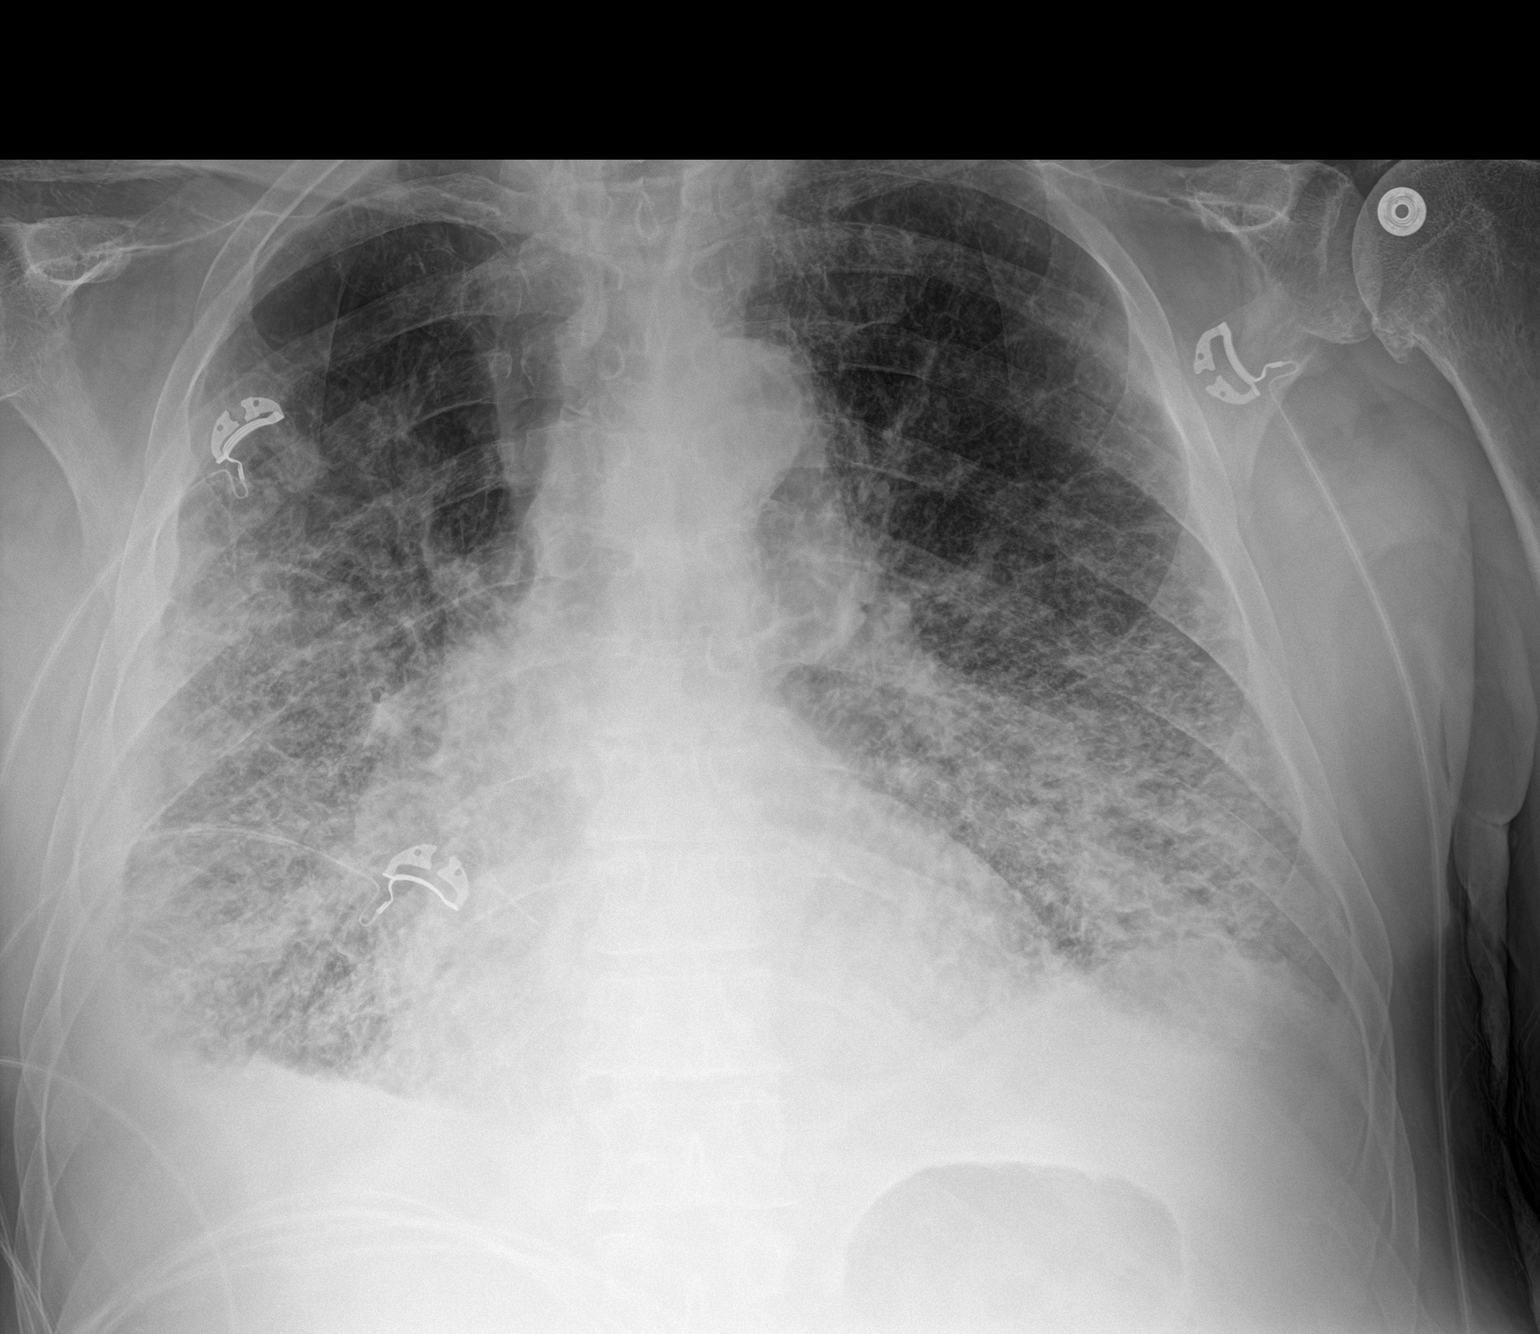

[1 of 1 positions shown; findings below may reference images not displayed]

FINDINGS: Cardiac shadow is stable. Diffuse airspace opacities are identified
as well as interstitial changes similar to that seen on the prior
exam. No new focal abnormality is seen. No bony abnormality is
noted.
IMPRESSION: Diffuse changes in the lungs bilaterally which may represent some
acute on chronic edema. The overall appearance is stable from the
prior exam.
